# Patient Record
Sex: Male | Born: 1945 | Race: Black or African American | Hispanic: No | Marital: Married | Smoking: Never smoker
Health system: Southern US, Community
[De-identification: ages and names within clinical notes are randomized; demographics above are authoritative.]

## PROBLEM LIST (undated history)

## (undated) DIAGNOSIS — I10 Essential (primary) hypertension: Secondary | ICD-10-CM

## (undated) DIAGNOSIS — I2510 Atherosclerotic heart disease of native coronary artery without angina pectoris: Secondary | ICD-10-CM

## (undated) DIAGNOSIS — K922 Gastrointestinal hemorrhage, unspecified: Secondary | ICD-10-CM

## (undated) DIAGNOSIS — D649 Anemia, unspecified: Secondary | ICD-10-CM

## (undated) DIAGNOSIS — K219 Gastro-esophageal reflux disease without esophagitis: Secondary | ICD-10-CM

## (undated) DIAGNOSIS — M1990 Unspecified osteoarthritis, unspecified site: Secondary | ICD-10-CM

## (undated) DIAGNOSIS — E039 Hypothyroidism, unspecified: Secondary | ICD-10-CM

## (undated) DIAGNOSIS — N400 Enlarged prostate without lower urinary tract symptoms: Secondary | ICD-10-CM

## (undated) DIAGNOSIS — H547 Unspecified visual loss: Secondary | ICD-10-CM

## (undated) DIAGNOSIS — E785 Hyperlipidemia, unspecified: Secondary | ICD-10-CM

## (undated) DIAGNOSIS — D72819 Decreased white blood cell count, unspecified: Principal | ICD-10-CM

## (undated) HISTORY — DX: Anemia, unspecified: D64.9

## (undated) HISTORY — DX: Hyperlipidemia, unspecified: E78.5

## (undated) HISTORY — PX: CORONARY ANGIOPLASTY WITH STENT PLACEMENT: SHX49

## (undated) HISTORY — PX: COLONOSCOPY: SHX174

## (undated) HISTORY — DX: Decreased white blood cell count, unspecified: D72.819

## (undated) HISTORY — DX: Essential (primary) hypertension: I10

## (undated) HISTORY — DX: Benign prostatic hyperplasia without lower urinary tract symptoms: N40.0

## (undated) HISTORY — DX: Atherosclerotic heart disease of native coronary artery without angina pectoris: I25.10

## (undated) HISTORY — PX: ESOPHAGOGASTRODUODENOSCOPY: SHX1529

## (undated) HISTORY — DX: Gastrointestinal hemorrhage, unspecified: K92.2

## (undated) HISTORY — PX: HEMORRHOID SURGERY: SHX153

## (undated) HISTORY — DX: Hypothyroidism, unspecified: E03.9

## (undated) HISTORY — DX: Unspecified visual loss: H54.7

---

## 1999-01-11 ENCOUNTER — Encounter (HOSPITAL_BASED_OUTPATIENT_CLINIC_OR_DEPARTMENT_OTHER): Payer: Self-pay | Admitting: General Surgery

## 1999-01-15 ENCOUNTER — Ambulatory Visit (HOSPITAL_COMMUNITY): Admission: RE | Admit: 1999-01-15 | Discharge: 1999-01-16 | Payer: Self-pay | Admitting: General Surgery

## 2000-12-01 ENCOUNTER — Emergency Department (HOSPITAL_COMMUNITY): Admission: EM | Admit: 2000-12-01 | Discharge: 2000-12-01 | Payer: Self-pay | Admitting: Emergency Medicine

## 2000-12-07 ENCOUNTER — Emergency Department (HOSPITAL_COMMUNITY): Admission: EM | Admit: 2000-12-07 | Discharge: 2000-12-07 | Payer: Self-pay | Admitting: Internal Medicine

## 2001-07-10 ENCOUNTER — Encounter: Payer: Self-pay | Admitting: Emergency Medicine

## 2001-07-10 ENCOUNTER — Emergency Department (HOSPITAL_COMMUNITY): Admission: EM | Admit: 2001-07-10 | Discharge: 2001-07-10 | Payer: Self-pay | Admitting: Emergency Medicine

## 2001-07-12 ENCOUNTER — Inpatient Hospital Stay (HOSPITAL_COMMUNITY): Admission: EM | Admit: 2001-07-12 | Discharge: 2001-07-16 | Payer: Self-pay | Admitting: Cardiology

## 2004-09-30 DIAGNOSIS — K922 Gastrointestinal hemorrhage, unspecified: Secondary | ICD-10-CM

## 2004-09-30 HISTORY — DX: Gastrointestinal hemorrhage, unspecified: K92.2

## 2005-09-18 ENCOUNTER — Inpatient Hospital Stay (HOSPITAL_COMMUNITY): Admission: EM | Admit: 2005-09-18 | Discharge: 2005-09-24 | Payer: Self-pay | Admitting: Emergency Medicine

## 2005-12-12 ENCOUNTER — Ambulatory Visit: Payer: Self-pay | Admitting: Cardiology

## 2005-12-23 ENCOUNTER — Ambulatory Visit: Payer: Self-pay

## 2007-12-28 ENCOUNTER — Ambulatory Visit: Payer: Self-pay | Admitting: Cardiovascular Disease

## 2007-12-28 ENCOUNTER — Observation Stay (HOSPITAL_COMMUNITY): Admission: EM | Admit: 2007-12-28 | Discharge: 2007-12-30 | Payer: Self-pay | Admitting: Emergency Medicine

## 2007-12-29 ENCOUNTER — Encounter: Payer: Self-pay | Admitting: Cardiovascular Disease

## 2008-01-14 ENCOUNTER — Ambulatory Visit: Payer: Self-pay | Admitting: Cardiology

## 2008-06-17 ENCOUNTER — Ambulatory Visit: Payer: Self-pay | Admitting: Cardiology

## 2008-10-06 ENCOUNTER — Ambulatory Visit: Payer: Self-pay | Admitting: Cardiology

## 2008-10-13 ENCOUNTER — Ambulatory Visit: Payer: Self-pay

## 2008-11-03 ENCOUNTER — Ambulatory Visit: Payer: Self-pay | Admitting: Cardiology

## 2008-12-13 ENCOUNTER — Encounter: Payer: Self-pay | Admitting: Cardiology

## 2008-12-13 ENCOUNTER — Ambulatory Visit: Payer: Self-pay | Admitting: Cardiology

## 2008-12-13 ENCOUNTER — Encounter (INDEPENDENT_AMBULATORY_CARE_PROVIDER_SITE_OTHER): Payer: Self-pay | Admitting: *Deleted

## 2008-12-13 DIAGNOSIS — E785 Hyperlipidemia, unspecified: Secondary | ICD-10-CM

## 2008-12-13 DIAGNOSIS — I10 Essential (primary) hypertension: Secondary | ICD-10-CM | POA: Insufficient documentation

## 2008-12-13 DIAGNOSIS — E039 Hypothyroidism, unspecified: Secondary | ICD-10-CM | POA: Insufficient documentation

## 2008-12-13 DIAGNOSIS — I2510 Atherosclerotic heart disease of native coronary artery without angina pectoris: Secondary | ICD-10-CM | POA: Insufficient documentation

## 2009-06-01 ENCOUNTER — Ambulatory Visit: Payer: Self-pay | Admitting: Cardiology

## 2010-06-08 ENCOUNTER — Ambulatory Visit: Payer: Self-pay | Admitting: Cardiology

## 2010-07-18 ENCOUNTER — Encounter: Payer: Self-pay | Admitting: Cardiology

## 2010-11-01 NOTE — Assessment & Plan Note (Signed)
Summary: 1 yr rov 414.01  pfh,rn  Medications Added CETIRIZINE HCL 10 MG TABS (CETIRIZINE HCL) as needed VITAMIN D3 1000 UNIT CAPS (CHOLECALCIFEROL) 1 by mouth daily      Allergies Added: NKDA  Visit Type:  Follow-up Primary Provider:  Dr. Margaretmary Bayley  CC:  CAD.  History of Present Illness: The patient presents for yearly followup. Since I last saw him he has been doing quite well. He remains active working. He says that rarely she will get some chest pain. He thinks he used about 3 or 4 nitroglycerin in the past year. This has been a stable pattern since his catheterization in 2009. He says he can do his usual activities without chest pressure, neck or arm discomfort. He is not bothered by shortness of breath, PND or orthopnea. He does not describe palpitations, presyncope or syncope. He has had some leg pain recently. He was found to have a low vitamin D level which was thought to be the culprit and this is currently being treated. He does not describe claudication but rather a neuropathic type pain.  Current Medications (verified): 1)  Isosorbide Mononitrate Cr 120 Mg Xr24h-Tab (Isosorbide Mononitrate) .... Take One Tablet By Mouth Daily 2)  Synthroid 100 Mcg Tabs (Levothyroxine Sodium) .Marland Kitchen.. 1 Tab Daily 3)  Toprol Xl 100mg  .... 1 Tab Daily 4)  Avodart 0.5 Mg Caps (Dutasteride) .Marland Kitchen.. 1 By Mouth Daily 5)  Lipitor 20 Mg Tabs (Atorvastatin Calcium) .... Take 1 Tab By Mouth Daily 6)  Flomax 0.4 Mg Cp24 (Tamsulosin Hcl) .... Take 1 Capsule By Mouth Nightly 7)  Omeprazole 20 Mg Cpdr (Omeprazole) .... One By Mouth Daily 8)  Aspirin 81 Mg  Tbec (Aspirin) .... One By Mouth Every Day 9)  Nitroglycerin 0.4 Mg Subl (Nitroglycerin) .... As Directed For Cp 10)  Cetirizine Hcl 10 Mg Tabs (Cetirizine Hcl) .... As Needed 11)  Vitamin D3 1000 Unit Caps (Cholecalciferol) .Marland Kitchen.. 1 By Mouth Daily  Allergies (verified): No Known Drug Allergies  Past History:  Past Medical History:  1. Coronary artery  disease (catheterization June 2009, demonstrate left mild calcification, LAD had luminal irregularities, 60% stenosis in the distal portion, circumflex had a stent in the AV groove which was via the patent.  The right coronary artery had a  mid stent which is via the patent.  EF of 60%.  There was posterolateral branch with 70% stenosis that was near in caliber. The EF of 65%).).   2. Hypothyroidism.   3. Gastrointestinal bleeding in 2006.   4. Congenital blindness.   5. Hypertension.   6. Dyslipidemia.   7. Benign prostatic hypertrophy.      Past Surgical History: Reviewed history from 12/12/2008 and no changes required. EGD Colonoscopy Hemorrhoid surgery  Review of Systems       As stated in the HPI and negative for all other systems.   Vital Signs:  Patient profile:   65 year old male Height:      63 inches Weight:      180 pounds BMI:     32.00 Pulse rate:   86 / minute Resp:     16 per minute BP sitting:   145 / 81  (right arm)  Vitals Entered By: Marrion Coy, CNA (June 08, 2010 10:54 AM)  Physical Exam  General:  Well developed, well nourished, in no acute distress. Head:  normocephalic and atraumatic Mouth:  Teeth, gums and palate normal. Oral mucosa normal. Neck:  Neck supple, no JVD. No  masses, thyromegaly or abnormal cervical nodes. Chest Wall:  no deformities or breast masses noted Lungs:  Clear bilaterally to auscultation and percussion. Abdomen:  Bowel sounds positive; abdomen soft and non-tender without masses, organomegaly, or hernias noted. No hepatosplenomegaly. Msk:  Back normal, normal gait. Muscle strength and tone normal. Extremities:  No clubbing or cyanosis. Neurologic:  Alert and oriented x 3. Skin:  Intact without lesions or rashes. Cervical Nodes:  no significant adenopathy Inguinal Nodes:  no significant adenopathy Psych:  Normal affect.   Detailed Cardiovascular Exam  Neck    Carotids: Carotids full and equal bilaterally without  bruits.      Neck Veins: Normal, no JVD.    Heart    Inspection: no deformities or lifts noted.      Palpation: normal PMI with no thrills palpable.      Auscultation: regular rate and rhythm, S1, S2 without murmurs, rubs, gallops, or clicks.    Vascular    Abdominal Aorta: no palpable masses, pulsations, or audible bruits.      Femoral Pulses: normal femoral pulses bilaterally.      Pedal Pulses: pulses normal in all 4 extremities    Radial Pulses: normal radial pulses bilaterally.      Peripheral Circulation: no clubbing, cyanosis, or edema noted with normal capillary refill.     EKG  Procedure date:  06/08/2010  Findings:      sinus bradycardia, rate 87, left axis deviation, right atrial enlargement, premature atrial contractions, no acute ST-T wave changes  Impression & Recommendations:  Problem # 1:  CAD (ICD-414.00) He has rare chest pain which is a stable pattern. This has not changed since his last evaluation with catheterization in 2009. No change in therapies or further testing is indicated. He should continue with secondary risk reduction.  Problem # 2:  ESSENTIAL HYPERTENSION, BENIGN (ICD-401.1) His blood pressure is slightly elevated today. However, it was not previously and he says is well controlled at home. Therefore, I will not suggest change. Certainly weight loss would be helpful to maintain target.  Problem # 3:  HYPERLIPIDEMIA (ICD-272.4) He says this is followed by his primary physician. Goal should be an LDL less than 100 and HDL greater than 40.  Patient Instructions: 1)  Your physician recommends that you schedule a follow-up appointment in: 12 months with Dr Antoine Poche 2)  Your physician recommends that you continue on your current medications as directed. Please refer to the Current Medication list given to you today.

## 2010-11-02 ENCOUNTER — Encounter: Payer: Self-pay | Admitting: Cardiology

## 2010-11-02 ENCOUNTER — Ambulatory Visit: Admit: 2010-11-02 | Payer: Self-pay | Admitting: Cardiology

## 2010-11-02 ENCOUNTER — Ambulatory Visit (INDEPENDENT_AMBULATORY_CARE_PROVIDER_SITE_OTHER): Payer: BC Managed Care – PPO | Admitting: Cardiology

## 2010-11-02 DIAGNOSIS — I2510 Atherosclerotic heart disease of native coronary artery without angina pectoris: Secondary | ICD-10-CM

## 2010-11-07 NOTE — Assessment & Plan Note (Signed)
Summary: 70yr f/u.mt/sp   Vital Signs:  Patient profile:   65 year old male Height:      63 inches Weight:      180 pounds BMI:     32.00 Pulse rate:   69 / minute Resp:     18 per minute BP sitting:   129 / 67  (left arm)  Vitals Entered By: Celestia Khat, CMA (November 02, 2010 2:32 PM)  Visit Type:  Follow-up 1 year Primary Provider:  Dr. Margaretmary Bayley  CC:  CAD.  History of Present Illness: The patient returns for routine followup. Since I last saw him he has had no new cardiovascular complaints. Continues to work. He does rarely takes nitroglycerin but hasn't used this in January. With his activities he denies any reproducible chest pressure, neck or arm discomfort. He has no palpitations, presyncope or syncope. He has had no weight gain or edema. He denies any shortness of breath, PND or orthopnea.  Problems Prior to Update: 1)  Hyperlipidemia  (ICD-272.4) 2)  Essential Hypertension, Benign  (ICD-401.1) 3)  Hypothyroidism  (ICD-244.9) 4)  Cad  (ICD-414.00)  Current Medications (verified): 1)  Isosorbide Mononitrate Cr 120 Mg Xr24h-Tab (Isosorbide Mononitrate) .... Take One Tablet By Mouth Daily 2)  Synthroid 100 Mcg Tabs (Levothyroxine Sodium) .Marland Kitchen.. 1 Tab Daily 3)  Toprol Xl 100mg  .... 1 Tab Daily 4)  Avodart 0.5 Mg Caps (Dutasteride) .Marland Kitchen.. 1 By Mouth Daily 5)  Lipitor 20 Mg Tabs (Atorvastatin Calcium) .... Take 1 Tab By Mouth Daily 6)  Flomax 0.4 Mg Cp24 (Tamsulosin Hcl) .... Take 1 Capsule By Mouth Nightly 7)  Omeprazole 20 Mg Cpdr (Omeprazole) .... One By Mouth Daily 8)  Aspirin 81 Mg  Tbec (Aspirin) .... One By Mouth Every Day 9)  Nitroglycerin 0.4 Mg Subl (Nitroglycerin) .... As Directed For Cp 10)  Cetirizine Hcl 10 Mg Tabs (Cetirizine Hcl) .... As Needed 11)  Vitamin D3 1000 Unit Caps (Cholecalciferol) .Marland Kitchen.. 1 By Mouth Daily  Allergies (verified): No Known Drug Allergies  Past History:  Past Medical History: Reviewed history from 06/08/2010 and no changes  required.  1. Coronary artery disease (catheterization June 2009, demonstrate left mild calcification, LAD had luminal irregularities, 60% stenosis in the distal portion, circumflex had a stent in the AV groove which was via the patent.  The right coronary artery had a  mid stent which is via the patent.  EF of 60%.  There was posterolateral branch with 70% stenosis that was near in caliber. The EF of 65%).).   2. Hypothyroidism.   3. Gastrointestinal bleeding in 2006.   4. Congenital blindness.   5. Hypertension.   6. Dyslipidemia.   7. Benign prostatic hypertrophy.      Past Surgical History: Reviewed history from 12/12/2008 and no changes required. EGD Colonoscopy Hemorrhoid surgery  Review of Systems       As stated in the HPI and negative for all other systems.   Physical Exam  General:  Well developed, well nourished, in no acute distress. Head:  normocephalic and atraumatic Mouth:  Teeth, gums and palate normal. Oral mucosa normal. Neck:  Neck supple, no JVD. No masses, thyromegaly or abnormal cervical nodes. Chest Wall:  no deformities or breast masses noted Lungs:  Clear bilaterally to auscultation and percussion. Abdomen:  Bowel sounds positive; abdomen soft and non-tender without masses, organomegaly, or hernias noted. No hepatosplenomegaly. Msk:  Back normal, normal gait. Muscle strength and tone normal. Extremities:  No clubbing or cyanosis.  Neurologic:  Alert and oriented x 3. Skin:  Intact without lesions or rashes. Cervical Nodes:  no significant adenopathy Inguinal Nodes:  no significant adenopathy Psych:  Normal affect.   Detailed Cardiovascular Exam  Neck    Carotids: Carotids full and equal bilaterally without bruits.      Neck Veins: Normal, no JVD.    Heart    Inspection: no deformities or lifts noted.      Palpation: normal PMI with no thrills palpable.      Auscultation: regular rate and rhythm, S1, S2 without murmurs, rubs, gallops, or clicks.     Vascular    Abdominal Aorta: no palpable masses, pulsations, or audible bruits.      Femoral Pulses: normal femoral pulses bilaterally.      Pedal Pulses: pulses normal in all 4 extremities    Radial Pulses: normal radial pulses bilaterally.      Peripheral Circulation: no clubbing, cyanosis, or edema noted with normal capillary refill.     Impression & Recommendations:  Problem # 1:  CAD (ICD-414.00) The patient has had no new symptoms. No further cardiovascular testing is suggested. He will continue with secondary risk reduction.  Of note I reviewed his EKG. He had normal sinus rhythm, rate 71, leftward axis, no acute ST-T wave changes, intervals within normal limits  Problem # 2:  ESSENTIAL HYPERTENSION, BENIGN (ICD-401.1) His blood pressure is controlled and he will continue meds as listed.  Problem # 3:  HYPERLIPIDEMIA (ICD-272.4) Acute lipids done with an HDL of 30 and LDL of 69. He will continue with his current therapy though he understands his HDL is not at target.  Patient Instructions: 1)  Your physician recommends that you schedule a follow-up appointment in: 12 months with Dr Antoine Poche 2)  Your physician recommends that you continue on your current medications as directed. Please refer to the Current Medication list given to you today. Prescriptions: NITROGLYCERIN 0.4 MG SUBL (NITROGLYCERIN) as directed for cp  #25 x prn   Entered by:   Charolotte Capuchin, RN   Authorized by:   Rollene Rotunda, MD, [facility]   Signed by:   Charolotte Capuchin, RN on 11/02/2010   Method used:   Electronically to        [facility] (retail)       [address]       [place], [place]  [address]       Ph: [phone]       Fax: [phone]   RxID:   [id number]

## 2010-11-07 NOTE — Letter (Signed)
Summary: Work Writer, Main Office  [address]   [place], [place] [address]   Phone: [phone]  Fax: [phone]         November 02, 2010    Dustin Hess   The above named patient had a medical visit today.  Please take this into consideration when reviewing the time away from work/school.      Sincerely yours,     Avie Arenas, RN for Dr. Rollene Rotunda [facility]

## 2010-11-27 NOTE — Letter (Signed)
Summary: Health Risk Assessment   Health Risk Assessment   Imported By: Roderic Ovens 11/21/2010 12:55:06  _____________________________________________________________________  External Attachment:    Type:   Image     Comment:   External Document

## 2011-01-08 ENCOUNTER — Other Ambulatory Visit: Payer: Self-pay | Admitting: Cardiology

## 2011-02-12 NOTE — Assessment & Plan Note (Signed)
[facility]                            CARDIOLOGY OFFICE NOTE   Dustin, Diveley TROYE Hess               MRN:          [id number]  DATE:10/06/2008                            DOB:          26-Mar-1946    PRIMARY CARE PHYSICIAN:  Margaretmary Bayley, MD.   REASON FOR PRESENTATION:  Evaluate the patient with chest discomfort.   HISTORY OF PRESENT ILLNESS:  The patient called to be added onto the  schedule.  He has been having some chest discomfort.  This has been  going on, he said since the holidays.  He has had some upper epigastric  discomfort.  He is noticing this if he walks a little bit briskly.  He  will get a dull, aching discomfort.  He does not have any symptoms at  rest.  When he gets this discomfort, it is substernal.  It seems to be  heavy discomfort.  It is mild to moderate.  He is not having any  associated nausea, vomiting, or diaphoresis.  He has nitroglycerin, but  has not really taking any.  It goes away after several minutes.  It  feels like it is previous angina.  He does not say that it is radiating  to his jaw or to his arms.  He is not having any palpitation,  presyncope, or syncope.   PAST MEDICAL HISTORY:  1. Coronary artery disease (catheterization June 2009, demonstrated      left mild calcification, LAD had luminal irregularities, 60%      stenosis in the distal portion, circumflex had a stent in the AV      groove which was via the patent.  The right coronary artery had a      mid stent which is via the patent.  EF of 60%.  There was      posterolateral branch with 70% stenosis that was near in caliber.      The EF of 65%).  2. Hypothyroidism.  3. Gastrointestinal bleeding in 2006.  4. Congenital blindness.  5. Hypertension.  6. Dyslipidemia.  7. Benign prostatic hypertrophy.   ALLERGIES/INTOLERANCES:  None.   MEDICATIONS:  1. Synthroid 100 mcg daily.  2. Metoprolol 100 mg daily.  3. Avodart.  4. Lipitor 20 mg  nightly.  5. Flomax.  6. Omeprazole 20 mg daily.  7. Aspirin 325 mg daily.  8. Isosorbide 60 mg daily.  9. Iron 27 mg b.i.d.   REVIEW OF SYSTEMS:  As stated in the HPI and otherwise negative for  other systems.   PHYSICAL EXAMINATION:  GENERAL:  The patient is in no distress.  VITAL SIGNS:  Blood pressure 111/62, heart rate 84 and regular, and  weight 170 pounds.  HEENT:  Eyes unremarkable.  Fundi are not visualized.  Pupils are  nonreactive.  Oral mucosa unremarkable.  NECK:  No jugular venous distention at 45 degrees.  Carotid upstroke  brisk and symmetric.  No bruits.  No thyromegaly.  LYMPHATIC:  No cervical, axillary, or inguinal adenopathy.  LUNGS:  Clear to auscultation bilaterally.  BACK:  No costovertebral angle tenderness.  CHEST:  Unremarkable.  HEART:  PMI not displaced  or sustained.  S1 and S2 within normal limits.  No S3, no S4.  No clicks, no rubs, no murmurs.  ABDOMEN:  Flat, positive bowel sounds, normal in frequency and pitch.  No bruits, no rebound, no guarding.  No midline pulsatile mass.  No  hepatomegaly.  No splenomegaly.  SKIN:  No rashes, no nodules.  EXTREMITIES:  Pulses 2+ throughout.  No edema, no cyanosis, no clubbing.  NEUROLOGIC:  Oriented to person, place, and time.  Cranial nerves II-XII  grossly intact.  Motor grossly intact.   EKG, sinus rhythm, rate 84, axis within normal limits, intervals within  normal limits, no acute ST wave changes.   ASSESSMENT AND PLAN:  1. Coronary artery disease.  The patient is having some chest pain      that is concerning for new exertional angina.  Given this, I think      stress perfusion imaging is warranted.  We could compare this to      the previous one of 2007.  Further evaluation will be based on      these results.  Any low-risk findings, I could manage medically,      but high-risk findings would require repeat cardiac      catheterization.  I will take his nitroglycerin if he has any      recurrent  pain.  Until he has a stress test, he will be taking it      easy.  He will need an adenosine Cardiolite.  2. Hypertension.  Blood pressure is well controlled.  He will continue      the meds as listed.  3. Dyslipidemia.  He will continue to follow this with Dr. Chestine Spore with      a goal LDL less than 100 and HDL greater than 40.  4. Hypothyroidism per Dr. Chestine Spore.  5. Followup.  I will see the patient again in 1 month or sooner if he      has any abnormality on his stress test.     Dustin Rotunda, MD, [facility]  Electronically Signed    JH/MedQ  DD: 10/06/2008  DT: 10/07/2008  Job #: [id number]   cc:   Margaretmary Bayley, M.D.

## 2011-02-12 NOTE — Assessment & Plan Note (Signed)
[facility]                            CARDIOLOGY OFFICE NOTE   Dustin Hess, Dustin Hess               MRN:          [id number]  DATE:01/14/2008                            DOB:          1946/01/10    PRIMARY CARE PHYSICIAN:  Dr. Margaretmary Bayley.   REASON FOR PRESENTATION:  Evaluate patient with chest pain.   HISTORY OF PRESENT ILLNESS:  The patient is a very pleasant 65 year old  African American gentleman who was recently hospitalized with chest  pain.  He ruled out for myocardial infarction.  He had a cardiac  catheterization demonstrating that the left main had mild calcification,  the LAD had mild luminal irregularities, there was a bifurcating  diagonal which had a 60% stenosis in the distal portion, the circumflex  had a stent in the AV groove which was widely patent, the right coronary  artery had a mid stent which was widely patent.  The EF was 60%.  There  was a posterolateral branch with 70% stenosis though it was narrow  caliber.  The EF was 65%.  The patient was managed medically.   Since discharge, he has had no chest pressure, neck or arm discomfort.  He has had no palpitations, presyncope or syncope.  He has had no PND or  orthopnea.   PAST MEDICAL HISTORY:  1. Coronary artery disease as described.  2. Hypothyroidism.  3. Gastrointestinal bleeding in 2006.  4. Congenital blindness.  5. Hypertension.  6. Dyslipidemia.  7. Benign prostatic hypertrophy.   ALLERGIES:  None.   MEDICATIONS:  1. Isosorbide 30 mg daily.  2. Synthroid 100 mcg daily.  3. Metoprolol 100 mg daily.  4. Avodart 0.5 mg daily.  5. Lipitor 20 mg nightly.  6. Flomax.  7. Iron  8. Omeprazole.  9. Aspirin 325 mg daily.   REVIEW OF SYSTEMS:  As stated in HPI, otherwise negative for other  systems.   PHYSICAL EXAMINATION:  GENERAL APPEARANCE:  The patient is in no  distress.  VITAL SIGNS:  Blood pressure 152/59, heart rate 72 and regular, weight  170  pounds.  NECK:  No jugular venous distension at 45 degrees.  Carotid upstroke  brisk and symmetrical.  No bruits, no thyromegaly.  LYMPHATICS:  No  adenopathy.  LUNGS:  Clear to auscultation bilaterally.  BACK:  No costovertebral angle tenderness.  CHEST:  Unremarkable.  HEART:  PMI not displaced or sustained, S1-S2 within normal limits, no  S3, positive S4, no murmurs.  ABDOMEN:  Flat, positive bowel sounds, normal in frequency and pitch.  No bruits, rebound, guarding or midline pulsatile mass, no organomegaly.  SKIN:  No rashes, no nodules.  EXTREMITIES:  2+ pulse, no edema.   EKG:  Sinus rhythm, rate 71, left axis deviation, intervals within  normal limits, left atrial enlargement, no acute ST-wave changes.   ASSESSMENT/PLAN:  1. Coronary disease.  The patient has nonobstructive coronary disease      as described.  He is going to be managed medically.  He will      continue with secondary risk reduction.  2. Hypothyroidism.  He will continue on Synthroid replacement and  management per Dr. Chestine Spore.  3. Dyslipidemia.  He will remain on Lipitor.  The lipids were not      checked in the hospital and I will defer to Dr. Chestine Spore.  The goal      being LDL less than 100, HDL greater than 40.  4. Hypertension.  Blood pressure is slightly elevated.  He may need      the addition of another medications such as amlodipine.  However,      first I am going to try just increasing his nitrates to Imdur 60 mg      daily.  Further evaluation will be based future blood pressure      readings.  5. Follow-up.  I would like to see him back in 6 months or sooner if      needed.     Dustin Rotunda, MD, [facility]  Electronically Signed    JH/MedQ  DD: 01/14/2008  DT: 01/14/2008  Job #: [id number]   cc:   Margaretmary Bayley, M.D.

## 2011-02-12 NOTE — Discharge Summary (Signed)
NAME:  Dustin Hess, Dustin Hess NO.:  [id number]   MEDICAL RECORD NO.:  [id number]          PATIENT TYPE:  OBV   LOCATION:  4731                         FACILITY:  [facility]   PHYSICIAN:  Rollene Rotunda, MD, FACCDATE OF BIRTH:  Jan 02, 1946   DATE OF ADMISSION:  12/28/2007  DATE OF DISCHARGE:  12/30/2007                               DISCHARGE SUMMARY   PRIMARY CARDIOLOGIST:  Dr. Rollene Rotunda.   PRIMARY CARE Arshia Rondon:  Dr. Margaretmary Bayley.   DISCHARGE DIAGNOSIS:  Chest pain.   SECONDARY DIAGNOSES:  1. Coronary artery disease status post prior stenting of the right      coronary artery and left circumflex.  2. History of preserved left ventricular function.  Ejection fraction      60%.  3. Hypothyroidism.  4. History of acute gastrointestinal bleed, December 2006.  5. Congenital blindness.  6. Hypertension.  7. Hyperlipidemia.  8. Benign prostatic hypertrophy.   ALLERGIES:  NO KNOWN DRUG ALLERGIES.   PROCEDURE:  A left heart cardiac catheterization.   HISTORY OF PRESENT ILLNESS:  A 65 year old male with prior history of  CAD.  He presented to the [facility] ED on December 28, 2007 with  complaints of exertional substernal chest discomfort relieved with  sublingual nitroglycerin.  Decision was made to admit him for further  evaluation.   HOSPITAL COURSE:  The patient ruled out for MI and underwent left heart  cardiac catheterization, on December 28, 2007.  This revealed patent stents  in the circumflex with 60% in-stent restenosis within the right coronary  artery and otherwise nonobstructive disease with normal LV function and  an EF of 65%.  Films reviewed by  Dr. Riley Kill, it was felt that the 60%  stenosis in-stent in the RCA was unlikely to be causing angina, and it  was felt that the patient would be best managed medically.  The patient  was noted be tachycardic with rates in the 100s and therefore his beta-  blocker was titrated to 100 mg daily.  He has not had any  recurrent  symptoms, and will be discharged home today in good condition.   DISCHARGE LABS:  Hemoglobin 14.6, hematocrit 43.2, WBC 5.2, platelets  133, MCV 92.9, sodium 142, potassium 4.4, chloride 108, CO2 of 25, BUN  13, creatinine 0.96, glucose 121, INR 1.0, total bilirubin 0.9, alkaline  phosphatase 75, AST 26, ALT 22, and albumin 3.9.  Cardiac markers  negative x4.  Calcium 9.0.  Urinalysis was  negative.   DISPOSITION:  The patient is being discharged home today in good  condition.   FOLLOWUP PLAN AND APPOINTMENTS:  The patient has scheduled follow up  with Dr. Antoine Poche on January 14, 2008, at 1:30 p.m..  He is also  to  follow up with Dr. Chestine Spore as previously scheduled.   DISCHARGE MEDICATIONS:  1. Aspirin 81 mg daily.  2. Flomax 0.4 mg daily.  3. Lipitor 20 mg daily.  4. Avodart 0.5 mg daily.  5. Synthroid 100 mcg daily.  6. Toprol-XL 100 mg daily  7. Protonix 40 mg daily.  8. Ferrous gluconate as previously prescribed.  9. MiraLax as previously prescribed  10.Nitroglycerin 0.4 mg sublingual, chest pain.  11.Imdur 30 mg daily.   OUTSTANDING LABORATORY STUDIES:  None.   DURATION OF DISCHARGE/ENCOUNTER:  45 minutes including physician time.      Nicolasa Ducking, ANP      Rollene Rotunda, MD, [facility]  Electronically Signed    CB/MEDQ  D:  12/30/2007  T:  12/31/2007  Job:  [id number]   cc:   Margaretmary Bayley, M.D.

## 2011-02-12 NOTE — Cardiovascular Report (Signed)
NAME:  Dustin Hess, Dustin Hess NO.:  [id number]   MEDICAL RECORD NO.:  [id number]          PATIENT TYPE:  OBV   LOCATION:  1827                         FACILITY:  [facility]   PHYSICIAN:  Jonelle Sidle, MD DATE OF BIRTH:  11/20/1945   DATE OF PROCEDURE:  DATE OF DISCHARGE:                            CARDIAC CATHETERIZATION   REQUESTING CARDIOLOGIST:  Noralyn Pick. Eden Emms, MD, [facility].   PRIMARY CARDIOLOGIST:  Rollene Rotunda, MD, [facility].   PRIMARY CARE PHYSICIAN:  Margaretmary Bayley, M.D.   INDICATIONS:  Mr. Caselli is a pleasant 65 year old gentleman with a  history of congenital blindness, hyperlipidemia, hypertension, previous  gastrointestinal bleed approximately 2 years ago, and known coronary  artery disease status post stent placement to the mid circumflex in  2002, as well as stent placement to the mid to distal right coronary  artery in 2002.  He now presents to the emergency department  experiencing chest discomfort that began this morning described as a  tightness and also associated with sinus tachycardia.  The patient  states that he ran out of his Toprol XL and has not taken his medicine  for approximately 2 days.  He was treated in the emergency department  and subsequently seen by cardiology.  His electrocardiogram was overall  nonspecific and his initial cardiac markers were normal.  He had  resolution of symptoms and is referred now for diagnostic cardiac  catheterization to redefine coronary anatomy and assess stent patency  for obvious revascularizations options.  The potential risks and  benefits were explained to him in advance and informed consent was  obtained.   PROCEDURE PERFORMED:  1. Left heart catheterization  2. Selective coronary angiography.  3. Left ventriculography.   ACCESS AND EQUIPMENT:  The area about the right femoral artery was  anesthetized with 1% lidocaine.  A 6-French sheath was placed in the  right femoral artery via the modified  Seldinger technique.  Standard  preformed 6-French JL-4 and JR-4 catheters were used for selective  coronary angiography and an angled pigtail catheter was used for left  heart catheterization and left ventriculography.  A total of 90 mL  Omnipaque were used.  All exchanges were made over the wire and the  patient tolerated the procedure well without immediate complications.   HEMODYNAMICS:  Aorta: 134/69 mmHg.  Left ventricle: 134/5 mmHg.   ANGIOGRAPHIC FINDINGS:  1. The left main coronary artery is relatively large and gives rise to      the left anterior  descending and circumflex vessels.  There is      mild calcification noted with no flow-limiting stenosis.  2. The left anterior descending is a medium caliber vessel that      extends down to the apex.  There is a proximal bifurcating diagonal      branch that has approximately 60% stenosis noted diffusely towards      the distal portion of the vessel.  The left anterior descending has      only mild luminal irregularities including 20% mid to distal      stenosis.  There are no flow-limiting stenoses.  3. The circumflex vessel is medium  in caliber.  There is a large first      obtuse marginal branch that bifurcates, followed by a smaller      branch and then a larger distal bifurcating branch.  A stent site      is visualized within the mid circumflex vessel and this area is      widely patent.  There are otherwise mild luminal irregularities      without flow-limiting stenosis.  4. The right coronary artery is medium in caliber.  A stent is      visualized within the mid to distal portion of the vessel.  Within      this is an area of approximately 60% in-stent restenosis.  Overall      vessel flow was TIMI III.  The posterior descending branch is      diffusely diseased approximately 70% and is relatively small in      caliber.  There are otherwise mild luminal irregularities noted.   Left ventriculography was performed in the  RAO projection and revealed  an ejection fraction approximately 65% with no focal wall motion  abnormalities and no mitral regurgitation.   DIAGNOSES:  1. Coronary artery disease as outlined.  The stent site within the mid      circumflex vessel is widely patent.  There is otherwise mild      atherosclerosis within the left anterior descending proper and      remaining circumflex vessel.  The right coronary artery has a area      of in-stent restenosis of approximately 60% in the mid vessel.      This area is fairly focal and is overall moderate.  The posterior      descending branch is relatively small and has a diffuse area of      stenosis of approximately 70%.  2. Left ventricular ejection fraction of approximately 65% with no      focal wall motion abnormalities.  No mitral regurgitation and left      ventricular end-diastolic pressure of 5 mmHg.   DISCUSSION:  I reviewed the films with Dr. Riley Kill.  I also discussed  the results with the patient.  It seems unlikely that the in-stent  restenosis in the right coronary artery would be leading to rest angina  based on its moderate degree of stenosis.  It may well be however, that  with the patient's relative tachycardia off beta-blockers, he is  experiencing angina due to relative supply and demand rather than an  acute coronary syndrome.  The plan at this point will be continued  observation with cycled cardiac markers and titration of medical therapy  including resumption of beta-blocker and perhaps a long-acting nitrate.  Further plans to follow depending on clinical progress.      Jonelle Sidle, MD  Electronically Signed     SGM/MEDQ  D:  12/28/2007  T:  12/28/2007  Job:  [id number]   cc:   Noralyn Pick. Eden Emms, MD, [facility]  Rollene Rotunda, MD, [facility]  Margaretmary Bayley, M.D.

## 2011-02-12 NOTE — Assessment & Plan Note (Signed)
[facility]                            CARDIOLOGY OFFICE NOTE   Dustin Hess, Leamer GIANLUCCA SZYMBORSKI               MRN:          [id number]  DATE:06/17/2008                            DOB:          1946-05-28    PRIMARY CARE PHYSICIAN:  Margaretmary Bayley, MD   REASON FOR PRESENTATION:  Evaluate the patient with coronary artery  disease.   HISTORY OF PRESENT ILLNESS:  The patient returns for followup.  He is 65  years old.  He has coronary artery disease as described below.  When I  last saw him, he was having some chest discomfort.  I did increase his  Imdur from 30 to 60 mg.  Since that time, he has had some fleeting chest  discomfort.  However, it is certainly not more and perhaps less than it  was previously.  He is occasionally taking nitroglycerin.  It comes on  sporadically.  It does not seem to be severe.  He is not describing  associated symptoms.  He is able to do some walking and he is trying to  do a little more of this and does not bring on these symptoms.   PAST MEDICAL HISTORY:  Coronary artery disease (catheterization in 2009,  left main mild calcification.  The LAD had luminal irregularities.  There was 60% stenosis in the distal portion.  The circumflex had a  stent in AV groove which was widely patent.  The right coronary artery  had a mid stent which was widely patent.  The EF was 60%.  There was a  posterolateral branch with 70% stenosis that was a narrow caliber.  The  EF was 65%.), hypothyroidism, gastrointestinal bleeding in 2006,  congenital blindness, hypertension, dyslipidemia, and benign prostatic  hypertrophy.   ALLERGIES:  None.   MEDICATIONS:  1. Synthroid 100 mcg daily.  2. Metoprolol 100 mg daily.  3. Avodart 0.5 mg daily.  4. Lipitor 20 mg nightly.  5. Flomax.  6. Iron.  7. Omeprazole 20 mg daily.  8. Aspirin 325 mg daily.  9. Isosorbide 60 mg daily.   REVIEW OF SYSTEMS:  As stated in the HPI and is otherwise negative  for  other systems.   PHYSICAL EXAMINATION:  GENERAL:  The patient is in no distress.  VITAL SIGNS:  Blood pressure 144/79, heart rate 84 and regular, weight  166 pounds, body mass index 29.  HEENT:  Eyelids are unremarkable, pupils are not reactive; fundi not  visualized; oral mucosa unremarkable.  NECK:  No jugular venous distention at 45 degrees; carotid upstroke  brisk and symmetric; no bruits; no thyromegaly.  LUNGS:  Lungs clear to auscultation.  HEART:  PMI not displaced or sustained; S1 and S2 within normal;  no S3.  Positive S4.  No murmurs.  ABDOMEN:  Flat.  Positive bowel sounds normal in frequency and pitch.  No bruits.  No rebound.  No guarding.  No midline pulsatile mass.  No  organomegaly.  SKIN:  No rashes, no nodules.  EXTREMITIES:  2+ pulses, no edema.   EKG sinus rhythm, rate 82, left axis deviation, probable left and right  atrial enlargement, no  acute ST-wave changes.   ASSESSMENT AND PLAN:  1. Coronary artery disease.  The patient is having no ongoing      symptoms.  No further cardiovascular workup is suggested.  He will      continue with the risk reduction.  2. Hypertension.  Blood pressure is slightly elevated.  It is better      than it was.  I will defer further management to Dr. Chestine Spore.  3. Dyslipidemia.  I suggested the patient that he has Dr. Chestine Spore if he      has had his cholesterol done recently.  He sees Dr. Chestine Spore      routinely.  The goal should be an LDL less than 100 and HDL greater      than 40.  4. Follow-up.  I will see back in the year or sooner if he has any      increasing symptoms.     Rollene Rotunda, MD, [facility]  Electronically Signed    JH/MedQ  DD: 06/17/2008  DT: 06/18/2008  Job #: [id number]   cc:   Margaretmary Bayley, M.D.

## 2011-02-12 NOTE — H&P (Signed)
NAME:  Dustin Hess, Dustin Hess NO.:  [id number]   MEDICAL RECORD NO.:  [id number]          PATIENT TYPE:  EMS   LOCATION:  MAJO                         FACILITY:  [facility]   PHYSICIAN:  Noralyn Pick. Eden Emms, MD, FACCDATE OF BIRTH:  [birth year]   DATE OF ADMISSION:  12/28/2007  DATE OF DISCHARGE:                              HISTORY & PHYSICAL   PRIMARY CARDIOLOGIST:  Dr. Rollene Rotunda.   PRIMARY CARE PHYSICIAN:  Dr. Margaretmary Bayley.   CHIEF COMPLAINT:  Chest pain.   HISTORY OF PRESENT ILLNESS:  Dustin Hess is a 65 year old male with  known coronary artery disease.  He has not had significant chest pain  since his MI in 2002, although he describes jaw tingling that is  intermittent and might be cardiac in origin.  Today he was getting up  and getting dressed, which is moderate activity for him and he had  sudden onset of substernal chest pain described as a tightness that  reached a 6/10.  He took his aspirin 81 mg and sublingual nitroglycerin.  EMS was called.  The nitroglycerin improved the pain and an additional  sublingual nitroglycerin by EMS as well as 3 more baby aspirin were  given.  He was pain free upon arrival to the emergency room.  Currently  he is resting comfortably.   PAST MEDICAL HISTORY:  1. 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  2. Preserved left ventricular function with an EF of 60% at cath.  3. Hypothyroidism.  4. History of acute GI bleed in December 2006 felt upper GI in origin      without identifiable site.  5. Congenital blindness.  6. Hyperlipidemia.  7. Hypertension.   SURGICAL HISTORY:  He is status post cardiac catheterization as well as  EGD, colonoscopy, and hemorrhoid surgery.   ALLERGIES:  No known drug allergies.   CURRENT MEDICATIONS:  1. Aspirin 81 mg daily.  2. Flomax 0.4 mg 2 tablets nightly.  3. Lipitor 20 mg daily.  4. Have a dark 0.5 mg 1 tablet daily.  5. Ferrous gluconate 27 mg b.i.d. with meals.  6. Synthroid 100 mcg daily.  7. Nitroglycerin sublingual p.r.n.  8. Toprol XL 50 mg daily out since yesterday.  9. MiraLax daily p.r.n.  10.Protonix 40 mg a day.   SOCIAL HISTORY:  Lives in [place] with his wife and works in Media planner  for [employer].  He has no history of alcohol, tobacco, or drug abuse.  He does not exercise regularly and says that he eats a reasonably heart  healthy diet.   FAMILY HISTORY:  Both of his parents died of cancer.  His father at 77  and his mother is 20.  He has multiple siblings but neither any of his  siblings nor his parents had a history of coronary have any history of  coronary artery disease.   REVIEW OF SYSTEMS:  He had some diaphoresis with the chest pain but  there was no radiation and no nausea or vomiting.  HEENT:  Has had no  recent changes.  The blindness is congenital and he has been blind for  many years.  The chest pain is described above but he has had no other  recent episodes of it recently.  He does get occasional tingling in his  jaw, but this is not clearly exertional and he is did not have it today  in association with the chest pain.  He has not had any recent wheezing  and he only coughs occasionally which is nonproductive.  He has had no  recent fevers or chills.  He denies arthralgias.  He has had no recent  melena and no hematemesis or hemoptysis.  Full 14 point review of  systems is otherwise negative.   PHYSICAL EXAM:  VITAL SIGNS:  Temperature is 97.1, blood pressure  133/83, pulse 90, respiratory rate 16, O2 saturation 99% on 2 liters.  GENERAL:  He is a well-developed [place] male in no acute  distress.  HEENT:  His head is normocephalic and atraumatic.  He is status post  right eye enucleation surgically.  His left eye has a clear sclera, but  the pupil does not react and is 2 mm.  Nares are  without discharge.  Oropharynx is without erythema or exudate.  Lips are without cyanosis.  NECK:  There is no lymphadenopathy, thyromegaly, bruit, JVD noted.  CV:  Heart is regular rate and rhythm with an S1-S2 and no significant  murmur or gallop was noted.  Distal pulses are intact in all four  extremities with no femoral bruits appreciated.  LUNGS:  The upper lobes are clear to auscultation with some rales in  both bases.  SKIN:  No rashes or lesions are noted.  ABDOMEN:  Soft and nontender with active bowel sounds and no  splenomegaly by palpation.  EXTREMITIES:  There is no cyanosis, clubbing or edema noted.  MUSCULOSKELETAL:  There is no joint deformity or effusions and no spine  or CVA tenderness is noted.  NEURO:  He is alert, oriented with normal strength and sensation.   CHEST X-RAY:  Shows decreased lung volume with minimal bilateral  atelectasis.   EKG:  Sinus rhythm rate 84 with no acute ischemic changes and no  significant changes from an EKG dated December 2006.   LABORATORY VALUES:  Sodium 139, potassium 4.5, chloride 106, CO2 28, BUN  14, creatinine 1.05, glucose 107, hemoglobin 49.6, hematocrit 43.2, WBCs  5.2, platelets 133,000, INR 1.0, PTT 33.  Point of care markers  initially negative times one and urinalysis negative.   IMPRESSION:  Chest pain:  His symptoms are concerning for unstable  angina as they started with minimal activity and were relieved with  nitroglycerin.  He will be admitted and we will add heparin to his  medication regimen with IV nitroglycerin reserved for recurrent  symptoms.  He will be continued on his other medications.  Dr. Eden Emms  has  been in to see Mr. Adan and feels that cardiac catheterization is  indicated to define his anatomy.  He is on schedule for later today.  He  will be continued on all of his home medications and we will check a TSH  as well as a lipid profile.      Dustin Demark, PA-C      Noralyn Pick.  Eden Emms, MD, [facility]  Electronically Signed    RB/MEDQ  D:  12/28/2007  T:  12/28/2007  Job:  [id number]   cc:   Margaretmary Bayley, M.D.

## 2011-02-12 NOTE — Assessment & Plan Note (Signed)
[facility]                            CARDIOLOGY OFFICE NOTE   Dustin Hess, Dustin Hess               MRN:          [id number]  DATE:11/03/2008                            DOB:          06-15-1946    PRIMARY CARE PHYSICIAN:  Margaretmary Bayley, MD   REASON FOR PRESENTATION:  Evaluate the patient with chest discomfort.   HISTORY OF PRESENT ILLNESS:  The patient returns for 14-month followup.  I had increased his Imdur at the last visit because he was still having  some chest discomfort.  He had a stress perfusion study following that  and it demonstrated that he had EF of 67% with no evidence of ischemia  or infarct.  This is a followup to the catheterization he had last year.  He still is getting some chest discomfort with exertion.  He states that  it happens if he walks briskly to the store.  If he does activities such  as rushing around, it is a dull discomfort.  He has taken about 6 or 7  nitroglycerin in the last month.  He takes one and discomfort goes away.  He may wait several minutes without taking a nitroglycerin and the  discomfort goes away.  At its peak it is 5/10.  It is dull.  It does not  radiate.  It is similar to previous angina.  He does not think that the  pattern has changed since his January visit when I did the stress test  and increased his Imdur.  He has not had any resting symptoms.  He has  had no associated nausea, vomiting, or diaphoresis.  He has had no  palpitation, presyncope, or syncope.  He denies any PND or orthopnea.   PAST MEDICAL HISTORY:  1. Coronary artery disease (see October 06, 2008, note for details.  He      had his last catheterization in June 2009.  He has well-preserved      ejection fraction).  2. Hypothyroidism.  3. Gastrointestinal bleeding in 2006.  4. Congenital blindness.  5. Hypertension.  6. Dyslipidemia.  7. Benign prostatic hypertrophy.   ALLERGIES:  None.   MEDICATIONS:  1. Synthroid  100 mcg daily.  2. Metoprolol 100 mg daily.  3. Avalide.  4. Lipitor 20 mg at bedtime.  5. Flomax 0.8 mg at bedtime.  6. Iron.  7. Omeprazole 20 mg daily.  8. Aspirin 325 mg daily.  9. Isosorbide 60 mg daily.   REVIEW OF SYSTEMS:  As stated in the HPI, and otherwise negative for  other systems.   PHYSICAL EXAMINATION:  GENERAL:  The patient is in no distress.  VITAL SIGNS:  Blood pressure 112/78, heart rate 79 and regular, weight  170 pounds, and body mass index 29.  HEENT:  Fundi not visualized; the pupils are nonreactive; the eyelids  are unremarkable, oral mucosa is unremarkable.  NECK:  No jugular venous distention at 45 degrees; carotid upstroke  brisk and symmetric; no bruits, no thyromegaly.  LYMPHATICS:  No cervical, axillary, or inguinal adenopathy.  LUNGS:  Clear to auscultation bilaterally.  BACK:  No costovertebral angle tenderness.  CHEST:  Unremarkable.  HEART:  PMI not displaced or sustained; S1 and S2 within normal limits;  no S3, no S4; no clicks, no rubs, no murmurs.  ABDOMEN:  Flat; positive  bowel sounds; normal in frequency and pitch; no bruits, no rebound, no  guarding; no midline pulsatile mass; no organomegaly.  SKIN:  No rashes.  No nodules.  EXTREMITIES:  Pulses 2+ throughout; no edema, no cyanosis, no clubbing.  NEURO:  Oriented to person, place, and time; cranial nerves intact  except for optic nerve, motor grossly intact.   EKG; sinus rhythm, rate 79, left axis deviation, sinus arrhythmia, no  acute ST-T wave changes.   ASSESSMENT AND PLAN:  1. Chest discomfort.  The patient is still having chest discomfort,      but he has had a cath demonstrating small vessel disease and      nonobstructive disease.  He has had negative stress perfusion      study.  It still sounds like angina.  I am going to continue to      manage him medically and try to increase his Imdur to 120 mg daily.      However, if he has any worsening symptoms I may be forced to  recath      and reconsider intervening on small vessels if possible.  He will      continue secondary risk reduction.  2. Hypertension.  Blood pressure is well controlled and he will      continue the meds as listed.  3. Dyslipidemia per Dr. Chestine Spore with goal LDL less than 100 and HDL      greater than 40.  4. Hypothyroidism.  This is managed by Dr. Chestine Spore.  5. Followup.  I would like to see him again in 6 weeks to see if the      med changes made any difference.     Rollene Rotunda, MD, [facility]  Electronically Signed    JH/MedQ  DD: 11/03/2008  DT: 11/03/2008  Job #: [id number]   cc:   Margaretmary Bayley, M.D.

## 2011-02-15 NOTE — Discharge Summary (Signed)
NAME:  WREN, GALLAGA NO.:  [id number]   MEDICAL RECORD NO.:  [id number]          PATIENT TYPE:  INP   LOCATION:  4709                         FACILITY:  [facility]   PHYSICIAN:  Margaretmary Bayley, M.D.    DATE OF BIRTH:  1946/04/03   DATE OF ADMISSION:  09/18/2005  DATE OF DISCHARGE:  09/24/2005                                 DISCHARGE SUMMARY   DISCHARGE DIAGNOSES:  1.  Upper gastrointestinal bleeding without identifiable site.  2.  History of coronary artery disease with a previous percutaneous      transluminal coronary angioplasty.  3.  Primary hypothyroidism.  4.  Old myocardial infarction.  5.  Chronic use of aspirin.  6.  Congenital blindness.   REASON FOR ADMISSION:  This is one of several [facility] hospitalizations  for Mr. Stegmann, a 65 year old African-American gentleman who was  brought into the emergency room by family that had been present when he  noted significant fatigue and what he describes as tiredness and then  developed some diarrhea.  Because he is blind, the patient did not realize  that what he thought was diarrhea was, in fact, melanotic stool, and when he  was evaluated in the emergency room, he was noted to have a blood pressure  of 100/50, which was low for him, and that his feeling of faintness and  weakness was, in fact, related to a low blood pressure.  As a matter of  fact, his blood pressure reached a nadir of about 60/35 with a pulse rate of  greater than 125 before enough volume of fluids and blood replacement  products were given to stabilize him.  Of importance is the fact that the  patient gives no history of alcoholism, no nonsteroidal anti-inflammatory  agents, but he does take a baby aspirin and has had no previous history of  peptic ulcer disease.  Because of his hemodynamically unstable vital signs,  he was admitted to medical ICU and started on a Protonix drip under the  guidance of Dr. Bosie Clos of the GI service.  He  was given several units of  red blood cells to maintain a hematocrit of about 28-29 and with IV fluids,  his blood pressure remained fairly steady over the first 245 hours.   After that initial bleed, the patient had no further or any obvious bleeding  noted.  His stools continued to be somewhat dark, but he did not have the  volume.  He had no complaints of abdominal pain.  As noted, a GI consultant  was brought in.  Dr. Randa Evens was kind enough to see him for [place] and  subsequently took the patient to the OR, where the patient underwent upper  and lower endoscopy but without any obvious source of the bleeding.  He was  empirically treated with initially IV Protonix and then converted over to  p.o. Protonix while we gradually advanced his diet from clear liquids back  to a regular diet.  He did have orthostatic changes in blood pressure as  noted for the first 36 hours; however, after the second hospital day his  pressures stabilized.  He  was able to get up and move about without getting  dizzy.  No further bleeding was noted, and it was the opinion of Dr. Randa Evens  that in view of the patient's negative endoscopy and the fact that he was no  longer bleeding, that a red blood cell scan probably would not be helpful  and that we should at this point in time let the patient go home, not taking  aspirin or any other antiplatelet agent.  He was scheduled to be seen back  by the GI consultants in their office in the next four weeks and to be seen  in my office in two weeks, where we will again recheck his pressures and his  hematocrit.   He was instructed to resume his Toprol at 50 mg per day and Synthroid at 0.1  mg per day, Flomax 0.4 mg at bedtime, and Lipitor 20 mg per day as before  his hospitalization.  He was instructed definitely not to take any aspirin  but was asked to use some ferrous gluconate 300 mg twice a day.  Miralax was  felt to be indicated by GI medicine, and he was instructed  on the use of 17  g in 8 ounces of water once a day along with Protonix at 40 mg per day.   CONDITION ON DISCHARGE:  Much improved.  His prognosis is still a question  because we do not have an underlying etiology for his GI bleeding.   He is discharged on a 3-4 g modified fat-restricted diet.           ______________________________  Margaretmary Bayley, M.D.     PC/MEDQ  D:  11/21/2005  T:  11/22/2005  Job:  [id number]

## 2011-02-15 NOTE — Cardiovascular Report (Signed)
[place]. [facility]  Patient:    Dustin Hess, Dustin Hess Visit Number: [id number] MRN: [id number]          Service Type: MED Location: CCUA 2921 01 Attending Physician:  Rollene Rotunda Dictated by:   Everardo Beals Juanda Chance, M.D. [facility] Proc. Date: 07/12/01 Admit Date:  07/12/2001   CC:         [name]. Chestine Spore, M.D.  Rollene Rotunda, M.D. [facility]  Cardiac Catheterization Lab   Cardiac Catheterization  CLINICAL HISTORY:  Dustin Hess is 65 years old and has no prior history of known heart disease.  He had the onset of chest pain at 9:30 this morning and presented to [facility] Emergency Room where the ECG showed an acute inferior wall infarction.  He was transferred to [facility] for intervention but his intervention was delayed because the lab was tied up with another emergency procedure and the procedure on Dustin Hess had to be delayed about an hour.  Dustin Hess was enrolled in the AIMI trial.  DESCRIPTION OF PROCEDURE:  The procedure was performed via the right femoral artery using an arterial sheath and 6-French preformed coronary catheters.  A front wall arterial puncture was performed.  Omnipaque contrast was used.  At the completion of the diagnostic study, we made a decision to proceed with intervention on the right coronary artery.  The patient was randomized to no Angiojet as part of the AIMI trial.  He was given weight-adjusted heparin to prolong an ACT of greater than 200 seconds and was given double bolus Integrilin and infusion.  We used a JR4 7-French guiding catheter and a short Floppy wire.  We crossed the lesion in the mid right coronary artery with the wire without difficulty.  We direct-stented with a 3.5- x 18-mm Zeta stent deploying this initially with one inflation of 15 atmospheres for 30 seconds and a second inflation of 17 atmospheres for 25 seconds with the balloon pulled inside the distal edge.  Repeat diagnostic study  was then performed through the guiding catheter.  The patient tolerated the procedure well and left the laboratory in satisfactory condition.  RESULTS: 1. The left main coronary artery:  The left main coronary artery was free of    significant disease. 2. The left anterior descending artery:  The left anterior descending    artery gave rise to a diagonal branch and three septal perforators.  The    diagonal branch was irregular but there was no major obstruction in either    the diagonal or the LAD. 3. The circumflex artery:  The circumflex artery gave rise to a large marginal    branch, a small marginal branch, and a posterolateral branch.  There was    99% stenosis before the posterolateral branch with TIMI-1 flow antegrade    and some collateral filling as well. 4. The right coronary artery:  The right coronary artery was a large dominant    vessel that gave rise to a right ventricular branch, a posterior descending    branch, and a posterolateral branch.  There was 99% stenosis in the mid to    distal vessel with TIMI-2 flow distally and a filling defect felt to    represent thrombus just distal to the tight obstruction.  There was also    70% narrowing in the mid portion of the posterior descending branch.  Left ventriculogram:  The left ventriculogram, performed in the RAO projection, showed minimal hypokinesis of a mid inferior wall.  The  overall wall motion was quite good with an estimated ejection fraction of 60%.  Following stenting of the mid to distal right coronary artery stenosis, the stenosis improved from 99% to less than 10% and the flow improved from TIMI-2 to TIMI-3 flow.  The patient had the onset of chest pain at 9:30 and arrived at [facility] Emergency Room at 11:15.  The first balloon inflation was performed at 1414. This gave a door-to-balloon time of two hours and 59 minutes and a reperfusion time of four hours and 44 minutes.  IMPRESSION: 1. Acute  diaphragmatic wall infarction with a 99% stenosis in the mid to    distal right coronary artery, 70% stenosis in the posterior descending    branch of the right coronary artery, a 99% stenosis in the distal    circumflex, and irregularities in the diagonal branch to the left anterior    descending artery with minimal inferior wall hypokinesis. 2. Successful stent deployment in the mid to distal right coronary artery with    improvement in percent area narrowing from 99% to less than 10% and    improvement in the flow from TIMI-2 to TIMI-3 flow.  DISPOSITION:  The patient was transferred to the post angioplasty unit for further observation.  Will plan intervention on the circumflex artery in the next few days. Dictated by:   Everardo Beals Juanda Chance, M.D. [facility] Attending Physician:  Rollene Rotunda DD:  07/12/04 TD:  07/12/01 Job: [id number]

## 2011-02-15 NOTE — Discharge Summary (Signed)
[place]. [facility]  Patient:    Dustin Hess, Dustin Hess Visit Number: [id number] MRN: [id number]          Service Type: MED Location: CCUA 2921 01 Attending Physician:  Rollene Rotunda Dictated by:   Rozell Searing, P.A. Admit Date:  07/12/2001 Discharge Date: 07/16/2001   CC:         [name]. Chestine Spore, M.D.   Referring Physician Discharge [facility]  PROCEDURES: 1. Emergent stent of right coronary artery on July 12, 2001. 2. Elective stent of circumflex on July 15, 2001.  REASON FOR ADMISSION:  The patient is a 65 year old male with prior history of heart disease with cardiac risk factors notable for hypertension.  He presented with a two-week history of intermittent chest pain.  He had apparently presented to the [facility] ER two days prior to his [facility]ideg[facility] presentation with chest pain, but was found to have no objective evidence of ischemia.  He returned complain of intermittent chest pain since the previous evening, which worsened a few hours prior to his arrival.  The admission EKG revealed ST elevation inferiorly with ST depression in leads V1-V3.  He was stabilized with aspirin, beta blocker, intravenous nitroglycerin, and heparin and transferred directly to [facility] for emergent intervention.  LABORATORY DATA:  Cardiac enzymes:  Peak CPK 265/18.5, troponin I 0.02.  Lipid profile:  Total cholesterol 174, triglycerides 206, HDL 28, LDL 105 (cholesterol/HDL ratio 6.2).  TSH normal.  The CBC and metabolic profile were normal at discharge.  Admission CXR:  Normal.  HOSPITAL COURSE:  The patient was taken directly to the [facility] Cardiac Catheterization Lab where he underwent emergent coronary angiography and intervention of a 99% mid RCA lesion with thrombus.  Bruce Elvera Lennox Juanda Chance, M.D., successfully reduced to less than 10% stenosis after stent deployment.  The patient was  enrolled in the AIMI trial and was randomized to the Angiojet arm.  Residual anatomy notable for a 99% distal circumflex lesion with otherwise normal LAD and normal LV function.  The patient was taken back to the laboratory on July 15, 2001, for elective stenting of the tight circumflex lesion to 0% residual stenosis.  No complications were noted and he was cleared for discharge the following day.  MEDICATION ADJUSTMENTS:  The patient was on Toprol prior to admission for management of hypertension.  All other medications are new.  MEDICATIONS AT DISCHARGE: 1. Altace 2.5 mg b.i.d. 2. Toprol XL 100 mg q.d. 3. Plavix 75 mg q.d. (x 4 weeks). 4. Pravachol 20 mg q.h.s. 5. Coated aspirin 325 mg q.d. 6. Nitrostat 0.4 mg as directed.  ACTIVITY:  No heavy lifting/driving/work x 1 month.  DIET:  Maintain a low-fat/cholesterol diet.  WOUND CARE:  Call the office if there is any bleeding/swelling of the groin.  FOLLOW-UP:  The patient will be scheduled to follow up with Rollene Rotunda, M.D., in two weeks.  DISCHARGE DIAGNOSES: 1. Acute inferior myocardial infarction.    a. Emergent stent, 99%, at mid right coronary artery on July 12, 2001       (AIMI trial:  No Angiojet).    b. Residual 99% distal circumflex treated with stenting on July 15, 2001.    c. Normal left ventricular function. 2. Dyslipidemia. 3. History of hypertension. Dictated by:   Rozell Searing, P.A. Attending Physician:  Rollene Rotunda DD:  07/16/01 TD:  07/16/01 Job: [id number]

## 2011-02-15 NOTE — Op Note (Signed)
NAME:  Dustin Hess, BURGO NO.:  [id number]   MEDICAL RECORD NO.:  [id number]          PATIENT TYPE:  INP   LOCATION:  2916                         FACILITY:  [facility]   PHYSICIAN:  Fayrene Fearing L. Malon Kindle., M.D.DATE OF BIRTH:  1946-04-18   DATE OF PROCEDURE:  09/19/2005  DATE OF DISCHARGE:                                 OPERATIVE REPORT   PROCEDURE:  Colonoscopy.   ENDOSCOPIST:  Llana Aliment. Edwards, M.D.   MEDICATIONS:  Fentanyl 100 mcg, Versed 4 mg IV.   INDICATION:  Acute GI bleeding with negative upper endoscopy.   DESCRIPTION OF PROCEDURE:  The procedure was explained to the patient and  consent obtained.  With the patient in the left lateral decubitus position,  the pediatric adjustable scope was inserted.  The patient had a large amount  of clots and old blood in the colon throughout.  He had an extremely long,  tortuous colon with a large diameter similar to a cathartic colon.  Using  abdominal pressure, we were able to eventually reach the cecum.  Old blood  persisted up to the cecum.  After some time, I intubated the ileocecal valve  and the terminal ileum was normal with no signs of active bleeding or old  bleed.  The scope was withdrawn and the colon carefully examined.  We  irrigated vigorously in the right colon; no AVMs were seen, no active  bleeding and the mucosal pattern was completely normal throughout.  Vigorous  irrigation of approximately 2 L or more of fluid were sucked out and we were  unable to see any active bleeding.  No diverticulosis was seen.  The rectum  had old blood and clots, but no active bleeding.   ASSESSMENT:  Lower gastrointestinal bleed with no signs of recent bleeding  in the terminal ileum, but blood throughout the colon with no clear bleeding  site seen.   PLAN:  We will treat the patient clinically, keep him on clear liquids and  laxatives.  If he continues to bleed, he may need a bleeding scan or a  repeat colonoscopy.           ______________________________  Llana Aliment. Malon Kindle., M.D.     Waldron Session  D:  09/19/2005  T:  09/21/2005  Job:  [id number]   cc:   Margaretmary Bayley, M.D.  Fax: [phone]

## 2011-02-15 NOTE — Consult Note (Signed)
NAME:  Dustin Hess, CANAVAN NO.:  [id number]   MEDICAL RECORD NO.:  [id number]          PATIENT TYPE:  INP   LOCATION:  1825                         FACILITY:  [facility]   PHYSICIAN:  James L. Malon Kindle., M.D.DATE OF BIRTH:  June 04, 1946   DATE OF CONSULTATION:  09/18/2005  DATE OF DISCHARGE:                                   CONSULTATION   REFERRING PHYSICIAN:  Margaretmary Bayley, M.D.   HISTORY:  A delightful 65 year old [place] American male who had been in his  usual state of health.  He is blind and works for a [employer].  He was at work yesterday, had diarrhea and he could not tell if it was  melenic or red, and felt progressively weak and then passed bloody stools  during the night with bright red blood.  He came into the emergency room.  He was hypotensive and given IV fluids and his pressure had come up nicely.  He did have the bloody stools at home.  This was seen by his family who  gives some of the history here today.  The patient denies any epigastric  pain, dyspepsia, has not ever had ulcers.  He typically is intermittently  constipated but has not been constipated lately. He has never had any colon  evaluation.   CURRENT MEDICATIONS:  1.  Synthroid.  2.  Toprol.  3.  Aspirin 81 mg daily.  4.  Flomax.  5.  Lipitor.   ALLERGIES:  No known drug allergies.   PAST MEDICAL HISTORY:  History of coronary disease.  He has had previous  angioplasty and stents.  He reports that his heart is doing well he thinks.   PAST SURGICAL HISTORY:  The only surgery he has had in the past has been  hemorrhoid surgery.   FAMILY HISTORY:  Mother died of renal cancer.  Father died of some sort of  blood cancer. He is unable to tell me any more information.  He has no known  family history of colon cancer.   SOCIAL HISTORY:  He is married.  He has been blind since approximately early  1970s.  He noted he could see prior to that and actually attended school for  several years.  He is not able to tell me the etiology of his blindness.   REVIEW OF SYSTEMS:  GI review of systems is noted above.   PHYSICAL EXAMINATION:  VITAL SIGNS:  The patient is afebrile.  Pulse  currently 122.  Blood pressure 124/73 up from previous systolic in the 80s  and 90s.  Patient has IV fluids running and is receiving blood wide open at  this time.  HEENT:  Eyes:  Obvious impairment.  He can barely open his eyes.  Throat:  Mucous membranes are dry.  NECK:  Supple.  No lymphadenopathy.  LUNGS:  Clear.  CARDIOVASCULAR:  Regular rate and rhythm without murmurs or gallops.  ABDOMEN:  Soft and nontender.  RECTAL:  Not repeated.  Stool was bright red by the ER physician.   PERTINENT LABORATORY DATA:  Hemoglobin 9.5 on admission to the emergency  room.  BUN 33, creatinine 1.1.  Cardiac markers were negative.   ASSESSMENT:  Acute gastrointestinal bleed.  Elevated BUN and creatinine  could indicate an upper source.  He has been on 81 mg of aspirin. I think we  need to rule that out first.  My suspicion, however, is that this is  probably lower gastrointestinal in origin and possibly diverticular.   PLAN:  I have discussed this in detail with the patient and his family,  specifically his wife and niece.  We will proceed with an upper endoscopy  today.  I agree with empiric proton pump inhibitors which has been done and  agree with transfusion.           ______________________________  Llana Aliment Malon Kindle., M.D.     Waldron Session  D:  09/18/2005  T:  09/20/2005  Job:  [id number]

## 2011-02-15 NOTE — Op Note (Signed)
NAME:  Dustin Hess, DOCKENDORF NO.:  [id number]   MEDICAL RECORD NO.:  [id number]          PATIENT TYPE:  INP   LOCATION:  2916                         FACILITY:  [facility]   PHYSICIAN:  Fayrene Fearing L. Malon Kindle., M.D.DATE OF BIRTH:  03-22-1946   DATE OF PROCEDURE:  09/18/2005  DATE OF DISCHARGE:                                 OPERATIVE REPORT   PROCEDURE:  Upper endoscopy.   ENDOSCOPIST:  Llana Aliment. Randa Evens, M.D.   MEDICATIONS:  Cetacaine spray.  Fentanyl 25 mcg, Versed 1.5 mg IV.   INDICATIONS:  Acute GI bleed.  This was done to rule out an upper source.  The patient's BUN is slightly elevated.   DESCRIPTION OF PROCEDURE:  The procedure explained to the patient and  consent obtained.  With the patient in the left lateral decubitus position  the Olympus scope was inserted and advanced.  The stomach was entered and  the pylorus identified and passed.  The duodenum including the bulb and  second portion was normal.  There were no signs of any upper GI bleeding.  The pyloric channel, antrum and body were normal.  No ulcerations or  inflammation.  The fundus and cardia seem normal on retroflex view and were  normal.  The distal and proximal esophagus were endoscopically normal.  No  coffee-grounds material or any signs of bleeding whatsoever.   ASSESSMENT:  Acute gastrointestinal bleed.  No signs of upper source on  endoscopy, probably lower gastrointestinal.  578.1.   PLAN:  We will proceed with colonoscopy tomorrow as planned.           ______________________________  Llana Aliment. Malon Kindle., M.D.     Waldron Session  D:  09/18/2005  T:  09/20/2005  Job:  [id number]   cc:   Margaretmary Bayley, M.D.  Fax: [phone]

## 2011-02-15 NOTE — Cardiovascular Report (Signed)
[place]. [facility]  Patient:    Dustin Hess, Dustin Hess Visit Number: [id number] MRN: [id number]          Service Type: MED Location: CCUA 2921 01 Attending Physician:  Rollene Rotunda Proc. Date: 07/15/01 Admit Date:  07/12/2001 Discharge Date: 07/16/2001   CC:         [name]. Chestine Spore, M.D.  Rollene Rotunda, M.D. [facility]   Cardiac Catheterization  INDICATIONS:  [name] is 65 years old and was admitted on October 13, with an acute diaphragmatic wall infarction and had the right coronary artery stented.  He was in the AIMI Trial and was randomized to no angiojet.  He was brought back today for a treatment of a 99 to 100% occlusion of the distal circumflex artery which I thought was probably a chronic total occlusion.  DESCRIPTION OF PROCEDURE:  The procedure was performed via the left femoral artery using arterial sheath and a Voda 3.5 x 7 [place] guiding catheter with sideholes.  We first took pictures of the right coronary artery and left ventriculogram to document patency and recovery of left ventricular function. The patient was given weight-adjusted heparin upon ACT greater than 200 seconds and we withheld Integrilin until we crossed the lesion.  We had difficulty crossing with the wire when we first tried a Graphics PT and were unable to cross.  We used the help of a 2.0 x 15 mm Maverick for support and still were unable to cross.  We were finally able to cross with a 200 Crossit wire.  We then exchanged for a long Luge wire.  We dilated with a 2.0 x 15 mm Maverick performing several inflations up to 10 atmospheres for 30 seconds. We then removed the balloon and deployed a 2.25 x 18 mm Pixel deploying this with one inflation of 9 atmospheres for 30 seconds and a second inflation of 12 atmospheres for 28 seconds with the balloon pulled inside the distal edge. Repeat diagnostic study was then performed through the guiding catheter.   The patient tolerated the procedure well and left the laboratory in satisfactory condition.  The right coronary artery was widely patent at the stent site with no stenosis.  The left ventriculogram performed in the RAO projection showed minimal hypokinesis of the inferior wall with an estimated ejection fraction of 60%.  Following stenting of the distal circumflex artery, the stenosis improved from 100% to 0%.  CONCLUSION:  Successful stenting of the chronically totally occluded distal circumflex artery with improvement in percent diameter narrowing from 100% to 0%.  DISPOSITION:  The patient is returned to the recovery room for further observation. Attending Physician:  Rollene Rotunda DD:  07/12/01 TD:  07/15/01 Job: [id number]

## 2011-02-18 ENCOUNTER — Other Ambulatory Visit: Payer: Self-pay | Admitting: Cardiology

## 2011-04-08 ENCOUNTER — Other Ambulatory Visit: Payer: Self-pay | Admitting: Cardiology

## 2011-05-06 ENCOUNTER — Encounter: Payer: Self-pay | Admitting: Cardiology

## 2011-06-04 ENCOUNTER — Encounter: Payer: Self-pay | Admitting: Cardiology

## 2011-06-04 ENCOUNTER — Ambulatory Visit (INDEPENDENT_AMBULATORY_CARE_PROVIDER_SITE_OTHER): Payer: BC Managed Care – PPO | Admitting: Cardiology

## 2011-06-04 DIAGNOSIS — I10 Essential (primary) hypertension: Secondary | ICD-10-CM

## 2011-06-04 DIAGNOSIS — E785 Hyperlipidemia, unspecified: Secondary | ICD-10-CM

## 2011-06-04 DIAGNOSIS — I2510 Atherosclerotic heart disease of native coronary artery without angina pectoris: Secondary | ICD-10-CM

## 2011-06-04 NOTE — Assessment & Plan Note (Signed)
Given the patient's previous coronary disease stress testing is indicated. I would like to do this prior to having enlarged on a more rigorous exercise regimen. He wouldn't be able to walk a treadmill with his blindness. Therefore, he will need stress perfusion imaging.

## 2011-06-04 NOTE — Assessment & Plan Note (Signed)
I don't see any recent blood work. We'll check a fasting lipid profile when he returns for his stress test. The goal will be an LDL less than 100 and HDL greater than 40.

## 2011-06-04 NOTE — Patient Instructions (Signed)
Your physician has requested that you have a lexiscan myoview. For further information please visit [email or web address] Please follow instruction sheet, as given.  Your physician recommends that you return for a FASTING lipid profile: the same day as your stress test.  Continue current medications  Follow up in 1 year with Dr Antoine Poche.  You will receive a letter in the mail 2 months before you are due.  Please call us when you receive this letter to schedule your follow up appointment.

## 2011-06-04 NOTE — Progress Notes (Signed)
HPI The patient presents for followup of his known coronary disease.  Since I last saw him he has done relatively well. He has a stationary bike in his house but he hasn't been exercising. With his current level of activity which is somewhat limited by his blindness he does not report and cardiovascular symptoms. The patient denies any new symptoms such as chest discomfort, neck or arm discomfort. There has been no new shortness of breath, PND or orthopnea. There have been no reported palpitations, presyncope or syncope.    No Known Allergies  Current Outpatient Prescriptions  Medication Sig Dispense Refill  . aspirin 81 MG tablet Take 81 mg by mouth daily.        Marland Kitchen atorvastatin (LIPITOR) 20 MG tablet TAKE ONE (1) TABLET EACH DAY                                                  Generic for LIPITOR  30 tablet  6  . cetirizine (ZYRTEC) 10 MG tablet Take 10 mg by mouth as needed.        . Cholecalciferol (VITAMIN D3) 1000 UNITS CAPS Take 1 capsule by mouth daily.        Marland Kitchen dutasteride (AVODART) 0.5 MG capsule Take 0.5 mg by mouth daily.        . isosorbide mononitrate (IMDUR) 120 MG 24 hr tablet TAKE ONE (1) TABLET EACH DAY                                                  GENERIC FOR IMDUR  30 tablet  6  . levothyroxine (SYNTHROID, LEVOTHROID) 100 MCG tablet Take 100 mcg by mouth daily.        . metoprolol (TOPROL-XL) 100 MG 24 hr tablet TAKE ONE (1) TABLET EACH DAY                                                  Generic for TOPROL X  30 tablet  6  . nitroGLYCERIN (NITROSTAT) 0.4 MG SL tablet Place 0.4 mg under the tongue every 5 (five) minutes as needed.        Marland Kitchen omeprazole (PRILOSEC) 20 MG capsule Take 20 mg by mouth daily.        . Tamsulosin HCl (FLOMAX) 0.4 MG CAPS Take 0.4 mg by mouth daily.          Past Medical History  Diagnosis Date  . Coronary artery disease     Cath June 2009, demonstrate left mild calcification, LAD had luminal irregularities, 60% stenosis in the distal portion,  circumflex had a stent in the AV groove which was via the patent. The RCA had a mid stent which is via th epatent. EF 60%. THere was posterolateral branch with 70% stenosis that was near in caliber. The EF of 65%  . Hypothyroidism   . GI bleeding 2006  . Congenital blindness   . Hypertension   . Dyslipidemia   . Benign prostatic hypertrophy     Past Surgical History  Procedure Date  . Esophagogastroduodenoscopy   .  Colonoscopy   . Hemorrhoid surgery     ROS: Blind.  Otherwise as stated in the HPI and negative for all other systems.  PHYSICAL EXAM BP 154/88  Pulse 78  Resp 18  Ht 5\' 5"  (1.651 m)  Wt 173 lb (78.472 kg)  BMI 28.79 kg/m2 GENERAL:  Well appearing HEENT:  Pupils nonreactive, fundi not visualized, lens opacification, oral mucosa unremarkable NECK:  No jugular venous distention, waveform within normal limits, carotid upstroke brisk and symmetric, no bruits, no thyromegaly LYMPHATICS:  No cervical, inguinal adenopathy LUNGS:  Clear to auscultation bilaterally BACK:  No CVA tenderness CHEST:  Unremarkable HEART:  PMI not displaced or sustained,S1 and S2 within normal limits, no S3, no S4, no clicks, no rubs, no murmurs ABD:  Flat, positive bowel sounds normal in frequency in pitch, no bruits, no rebound, no guarding, no midline pulsatile mass, no hepatomegaly, no splenomegaly EXT:  2 plus pulses throughout, no edema, no cyanosis no clubbing SKIN:  No rashes no nodules NEURO:  Cranial nerves II through XII grossly intact, motor grossly intact throughout PSYCH:  Cognitively intact, oriented to person place and time  EKG:  NSR, leftward axis, possible RAE, no acute ST T wave changes  ASSESSMENT AND PLAN

## 2011-06-04 NOTE — Assessment & Plan Note (Signed)
His blood pressure is elevated today. He thinks it's been controlled when he gets it checked at work. 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

## 2011-06-11 ENCOUNTER — Encounter: Payer: Self-pay | Admitting: *Deleted

## 2011-06-19 ENCOUNTER — Other Ambulatory Visit (INDEPENDENT_AMBULATORY_CARE_PROVIDER_SITE_OTHER): Payer: BC Managed Care – PPO | Admitting: *Deleted

## 2011-06-19 ENCOUNTER — Ambulatory Visit (HOSPITAL_COMMUNITY): Payer: BC Managed Care – PPO | Attending: Cardiology | Admitting: Radiology

## 2011-06-19 VITALS — Ht 65.0 in | Wt 170.0 lb

## 2011-06-19 DIAGNOSIS — E785 Hyperlipidemia, unspecified: Secondary | ICD-10-CM

## 2011-06-19 DIAGNOSIS — H543 Unqualified visual loss, both eyes: Secondary | ICD-10-CM | POA: Insufficient documentation

## 2011-06-19 DIAGNOSIS — I252 Old myocardial infarction: Secondary | ICD-10-CM

## 2011-06-19 DIAGNOSIS — I2510 Atherosclerotic heart disease of native coronary artery without angina pectoris: Secondary | ICD-10-CM | POA: Insufficient documentation

## 2011-06-19 DIAGNOSIS — R079 Chest pain, unspecified: Secondary | ICD-10-CM

## 2011-06-19 LAB — LIPID PANEL
HDL: 28.7 mg/dL — ABNORMAL LOW (ref >39.00)
LDL Cholesterol: 80 mg/dL (ref 0–99)
Total CHOL/HDL Ratio: 4
VLDL: 15.4 mg/dL (ref 0.0–40.0)

## 2011-06-19 MED ORDER — TECHNETIUM TC 99M TETROFOSMIN IV KIT
10.5000 | PACK | Freq: Once | INTRAVENOUS | Status: AC | PRN
  Administered 2011-06-19: 11 via INTRAVENOUS

## 2011-06-19 MED ORDER — TECHNETIUM TC 99M TETROFOSMIN IV KIT
33.0000 | PACK | Freq: Once | INTRAVENOUS | Status: AC | PRN
  Administered 2011-06-19: 33 via INTRAVENOUS

## 2011-06-19 MED ORDER — REGADENOSON 0.4 MG/5ML IV SOLN
0.4000 mg | Freq: Once | INTRAVENOUS | Status: AC
  Administered 2011-06-19: 0.4 mg via INTRAVENOUS

## 2011-06-19 NOTE — Progress Notes (Signed)
[facility] SITE 3 NUCLEAR MED [address] [place] [address] [phone]  Cardiology Nuclear Med Study  Dustin Hess is a 65 y.o. male [id number] 04-27-1946  Nuclear Med Background Indication for Stress Test:  Evaluation for Ischemia and Stent Patency History:  '02 DMI> stents CFX, RCA, '09 Cath: CFX stent patent, 60% in-stent RCA,70% PDA, EF=65 (Med [place]), '09 Echo: EF=55%, 10/13/08 MPS: NL, EF=67% Cardiac Risk Factors: History of Smoking, Hypertension and Lipids  Symptoms:  Chest Pain with and without Exertion (last date of chest discomfort 2 months ago per patient), Fatigue and Fatigue with Exertion   Nuclear Pre-Procedure Caffeine/Decaff Intake:  None NPO After: 7:00pm   Lungs:  Clear IV 0.9% NS with Angio Cath:  20g  IV Site: R Hand  IV Started by:  Bonnita Levan, RN  Chest Size (in):  46 Cup Size: n/a  Height: 5\' 5"  (1.651 m)  Weight:  170 lb (77.111 kg)  BMI:  Body mass index is 28.29 kg/(m^2). Tech Comments:  No toprol today    Nuclear Med Study 1 or 2 day study: 1 day  Stress Test Type:  Lexiscan  Reading MD: Olga Millers, MD  Order Authorizing Provider:  Dr. Daiva Nakayama  Resting Radionuclide: Technetium 59m Tetrofosmin  Resting Radionuclide Dose: 10.5 mCi   Stress Radionuclide:  Technetium 30m Tetrofosmin  Stress Radionuclide Dose: 33 mCi           Stress Protocol Rest HR: 71 Stress HR: 115/59  Rest BP: 165/96 Stress BP: 115/59  Exercise Time (min): n/a METS: n/a   Predicted Max HR: 154 bpm % Max HR: 69.48 bpm Rate Pressure Product: 24401   Dose of Adenosine (mg):  n/a Dose of Lexiscan: 0.4 mg  Dose of Atropine (mg): n/a Dose of Dobutamine: n/a mcg/kg/min (at max HR)  Stress Test Technologist: Irean Hong, RN  Nuclear Technologist:  Domenic Polite, CNMT     Rest Procedure:  Myocardial perfusion imaging was performed at rest 45 minutes following the intravenous administration of Technetium 82m Tetrofosmin. Rest ECG: NSR  with sinus arrhythmia, PAC, poor R wave progresson  Stress Procedure:  The patient received IV Lexiscan 0.4 mg over 15-seconds.  Technetium 75m Tetrofosmin injected at 30-seconds.  There were nonspecific ST changes with Lexiscan. There was chest pain after lexiscan.  Quantitative spect images were obtained after a 45 minute delay. Stress ECG: No significant ST segment change suggestive of ischemia.  QPS Raw Data Images:  Acquisition technically good; normal left ventricular size. Stress Images:  Normal homogeneous uptake in all areas of the myocardium. Rest Images:  Normal homogeneous uptake in all areas of the myocardium. Subtraction (SDS):  No evidence of ischemia. Transient Ischemic Dilatation (Normal <1.22):  1.08 Lung/Heart Ratio (Normal <0.45):  .28  Quantitative Gated Spect Images QGS EDV:  75 ml QGS ESV:  25 ml QGS cine images:  NL LV Function; NL Wall Motion QGS EF: 67%  Impression Exercise Capacity:  Lexiscan with no exercise. BP Response:  Normal blood pressure response. Clinical Symptoms:  There is chest pain. ECG Impression:  No significant ST segment change suggestive of ischemia. Comparison with Prior Nuclear Study: No significant change from previous study  Overall Impression:  Normal stress nuclear study.  Olga Millers

## 2011-06-24 LAB — URINALYSIS, ROUTINE W REFLEX MICROSCOPIC
Bilirubin Urine: NEGATIVE
Ketones, ur: NEGATIVE
Nitrite: NEGATIVE
Protein, ur: NEGATIVE
Urobilinogen, UA: 1
pH: 7

## 2011-06-24 LAB — BASIC METABOLIC PANEL
BUN: 12
CO2: 25
CO2: 25
Calcium: 9
Creatinine, Ser: 0.96
Glucose, Bld: 121 — ABNORMAL HIGH
Glucose, Bld: 95
Potassium: 4.2
Sodium: 140
Sodium: 142

## 2011-06-24 LAB — DIFFERENTIAL
Basophils Relative: 0
Eosinophils Absolute: 0
Lymphs Abs: 0.3 — ABNORMAL LOW
Monocytes Absolute: 0.4
Monocytes Relative: 9
Neutrophils Relative %: 85 — ABNORMAL HIGH

## 2011-06-24 LAB — POCT CARDIAC MARKERS
CKMB, poc: <1 — ABNORMAL LOW
CKMB, poc: <1 — ABNORMAL LOW
Myoglobin, poc: 54.5
Myoglobin, poc: 66.2
Troponin i, poc: <0.05

## 2011-06-24 LAB — COMPREHENSIVE METABOLIC PANEL
ALT: 22
Albumin: 3.9
Alkaline Phosphatase: 75
Calcium: 9.3
GFR calc Af Amer: >60
Potassium: 4.5
Sodium: 139
Total Protein: 7

## 2011-06-24 LAB — APTT: aPTT: 33

## 2011-06-24 LAB — CK TOTAL AND CKMB (NOT AT ARMC): Relative Index: 1.6

## 2011-06-24 LAB — CBC
MCHC: 33.7
Platelets: 133 — ABNORMAL LOW
RDW: 12.7

## 2011-06-24 LAB — CARDIAC PANEL(CRET KIN+CKTOT+MB+TROPI)
CK, MB: 2.1
CK, MB: 2.1
Relative Index: 1.7
Total CK: 122
Total CK: 132
Troponin I: 0.04

## 2011-06-24 LAB — PROTIME-INR: INR: 1

## 2011-06-24 LAB — TROPONIN I: Troponin I: 0.03

## 2011-08-30 ENCOUNTER — Other Ambulatory Visit: Payer: Self-pay | Admitting: Cardiology

## 2011-09-25 ENCOUNTER — Other Ambulatory Visit: Payer: Self-pay | Admitting: Cardiology

## 2011-11-08 ENCOUNTER — Other Ambulatory Visit: Payer: Self-pay | Admitting: Cardiology

## 2011-11-08 MED ORDER — NITROGLYCERIN 0.4 MG SL SUBL: 0.4000 mg | SUBLINGUAL_TABLET | SUBLINGUAL | Status: DC | PRN

## 2011-11-08 NOTE — Telephone Encounter (Signed)
..   Requested Prescriptions   Pending Prescriptions Disp Refills  . nitroGLYCERIN (NITROSTAT) 0.4 MG SL tablet      Sig: Place 1 tablet (0.4 mg total) under the tongue every 5 (five) minutes as needed.

## 2011-11-09 ENCOUNTER — Other Ambulatory Visit: Payer: Self-pay | Admitting: Cardiology

## 2012-02-14 ENCOUNTER — Other Ambulatory Visit: Payer: Self-pay | Admitting: Internal Medicine

## 2012-02-14 ENCOUNTER — Ambulatory Visit (HOSPITAL_COMMUNITY)
Admission: RE | Admit: 2012-02-14 | Discharge: 2012-02-14 | Disposition: A | Discharge status: Home / Self Care | Payer: Medicare Other | Source: Ambulatory Visit | Attending: Internal Medicine | Admitting: Internal Medicine

## 2012-02-14 DIAGNOSIS — M25569 Pain in unspecified knee: Secondary | ICD-10-CM | POA: Insufficient documentation

## 2012-02-14 DIAGNOSIS — M25469 Effusion, unspecified knee: Secondary | ICD-10-CM | POA: Insufficient documentation

## 2012-02-14 DIAGNOSIS — R52 Pain, unspecified: Secondary | ICD-10-CM

## 2012-05-25 ENCOUNTER — Other Ambulatory Visit: Payer: Self-pay | Admitting: Cardiology

## 2012-05-25 NOTE — Telephone Encounter (Signed)
..   Requested Prescriptions   Pending Prescriptions Disp Refills  . metoprolol succinate (TOPROL-XL) 100 MG 24 hr tablet [Pharmacy Med Name: METOPROLOL SUCCINATE 100MG  TER] 30 tablet 3    Sig: TAKE ONE TABLET DAILY  .Marland Kitchen.patient needs to call office to make appointment for Sept or next open

## 2012-06-10 ENCOUNTER — Other Ambulatory Visit: Payer: Self-pay | Admitting: Cardiology

## 2012-06-10 NOTE — Telephone Encounter (Signed)
Pt out needs refill asap pt out

## 2012-06-10 NOTE — Telephone Encounter (Signed)
..   Requested Prescriptions   Pending Prescriptions Disp Refills  . isosorbide mononitrate (IMDUR) 120 MG 24 hr tablet [Pharmacy Med Name: ISOSORBIDE MONONITRA 120MG  TER] 30 tablet 6    Sig: TAKE ONE (1) TABLET EACH DAY

## 2012-06-29 ENCOUNTER — Encounter: Payer: Self-pay | Admitting: Cardiology

## 2012-06-29 ENCOUNTER — Ambulatory Visit (INDEPENDENT_AMBULATORY_CARE_PROVIDER_SITE_OTHER): Payer: Medicare Other | Admitting: Cardiology

## 2012-06-29 VITALS — BP 150/85 | HR 63 | Ht 65.0 in | Wt 163.8 lb

## 2012-06-29 DIAGNOSIS — E039 Hypothyroidism, unspecified: Secondary | ICD-10-CM

## 2012-06-29 DIAGNOSIS — I2510 Atherosclerotic heart disease of native coronary artery without angina pectoris: Secondary | ICD-10-CM

## 2012-06-29 DIAGNOSIS — I10 Essential (primary) hypertension: Secondary | ICD-10-CM

## 2012-06-29 DIAGNOSIS — E785 Hyperlipidemia, unspecified: Secondary | ICD-10-CM

## 2012-06-29 NOTE — Progress Notes (Signed)
HPI The patient presents for followup of his known coronary disease.  Since I last saw him he has done relatively well. He still has his stationary bicycle and still does not ride. He's had 2 episodes of chest discomfort in the past year for which he took 2 nitroglycerin a piece. He has not had any in several months. He is able to do activities such as lifting objects without bringing on any symptoms. He still works. He denies any shortness of breath, PND or orthopnea. He has had no palpitations, presyncope or syncope. Of note he did have a stress perfusion study last year which was negative for any evidence of ischemia or infarct. He had a normal ejection fraction.  No Known Allergies  Current Outpatient Prescriptions  Medication Sig Dispense Refill  . aspirin 81 MG tablet Take 81 mg by mouth daily.        Marland Kitchen atorvastatin (LIPITOR) 20 MG tablet TAKE ONE (1) TABLET EACH DAY  30 tablet  12  . cetirizine (ZYRTEC) 10 MG tablet Take 10 mg by mouth as needed.        . Cholecalciferol (VITAMIN D3) 1000 UNITS CAPS Take 1 capsule by mouth daily.        Marland Kitchen dutasteride (AVODART) 0.5 MG capsule Take 0.5 mg by mouth daily.        . isosorbide mononitrate (IMDUR) 120 MG 24 hr tablet TAKE ONE (1) TABLET EACH DAY  30 tablet  6  . levothyroxine (SYNTHROID, LEVOTHROID) 100 MCG tablet Take 100 mcg by mouth daily.        . metoprolol succinate (TOPROL-XL) 100 MG 24 hr tablet TAKE ONE TABLET DAILY  30 tablet  3  . nitroGLYCERIN (NITROSTAT) 0.4 MG SL tablet Place 1 tablet (0.4 mg total) under the tongue every 5 (five) minutes as needed.  15 tablet  0  . omeprazole (PRILOSEC) 20 MG capsule Take 20 mg by mouth daily.        . Tamsulosin HCl (FLOMAX) 0.4 MG CAPS Take 0.4 mg by mouth daily.          Past Medical History  Diagnosis Date  . Coronary artery disease     Cath June 2009, demonstrate left mild calcification, LAD had luminal irregularities, 60% stenosis in the distal portion, circumflex had a stent in the  AV groove which was patent. The RCA had a mid stent which was patent. EF 60%. THere was posterolateral branch with 70% stenosis that was narrow in caliber. The EF of 65%  . Hypothyroidism   . GI bleeding 2006  . Congenital blindness   . Hypertension   . Dyslipidemia   . Benign prostatic hypertrophy     Past Surgical History  Procedure Date  . Esophagogastroduodenoscopy   . Colonoscopy   . Hemorrhoid surgery     ROS: Blind.  Otherwise as stated in the HPI and negative for all other systems.  PHYSICAL EXAM BP 150/85  Pulse 63  Ht 5\' 5"  (1.651 m)  Wt 74.299 kg (163 lb 12.8 oz)  BMI 27.26 kg/m2 GENERAL:  Well appearing HEENT:  Pupils nonreactive, fundi not visualized, lens opacification, oral mucosa unremarkable NECK:  No jugular venous distention, waveform within normal limits, carotid upstroke brisk and symmetric, no bruits, no thyromegaly LYMPHATICS:  No cervical, inguinal adenopathy LUNGS:  Clear to auscultation bilaterally BACK:  No CVA tenderness CHEST:  Unremarkable HEART:  PMI not displaced or sustained,S1 and S2 within normal limits, no S3, no S4, no clicks, no  rubs, no murmurs ABD:  Flat, positive bowel sounds normal in frequency in pitch, no bruits, no rebound, no guarding, no midline pulsatile mass, no hepatomegaly, no splenomegaly EXT:  2 plus pulses throughout, no edema, no cyanosis no clubbing SKIN:  No rashes no nodules NEURO:  Cranial nerves II through XII grossly intact, motor grossly intact throughout PSYCH:  Cognitively intact, oriented to person place and time  EKG:  NSR, rate 63, leftward axis, possible RAE, no acute ST T wave changes.  06/29/2012  ASSESSMENT AND PLAN   CAD -  He's had only 2 episodes of chest discomfort in the last year since he had his stress test which was negative. At this point I don't think further cardiovascular testing is suggested. He will continue with risk reduction in left knee no if he has any increasing symptoms going  forward.  ESSENTIAL HYPERTENSION, BENIGN -  His blood pressure is elevated today.  However, he says it is good when taken elsewhere. I looked back at our previous readings and he's had only 2 systolic blood pressures above 150. Therefore, I won't make any changes. He will try to get somebody to check her.   HYPERLIPIDEMIA -  The patient will come back for a lipid with a goal LDL less than 100.

## 2012-06-29 NOTE — Patient Instructions (Addendum)
The current medical regimen is effective;  continue present plan and medications.  Please return for fasting blood work  Follow up in 1 year with Dr Antoine Poche.  You will receive a letter in the mail 2 months before you are due.  Please call us when you receive this letter to schedule your follow up appointment.

## 2012-07-01 ENCOUNTER — Other Ambulatory Visit (INDEPENDENT_AMBULATORY_CARE_PROVIDER_SITE_OTHER): Payer: Medicare Other

## 2012-07-01 DIAGNOSIS — E785 Hyperlipidemia, unspecified: Secondary | ICD-10-CM

## 2012-07-01 LAB — LIPID PANEL
Cholesterol: 120 mg/dL (ref 0–200)
HDL: 32.5 mg/dL — ABNORMAL LOW (ref >39.00)
Total CHOL/HDL Ratio: 4
Triglycerides: 75 mg/dL (ref 0.0–149.0)

## 2012-07-14 IMAGING — CR DG KNEE COMPLETE 4+V*L*
4 series · 4 of 4 positions shown · non-contrast
Comparison: No priors.

CLINICAL DATA: Left knee pain and swelling.  No history of injury.

LEFT KNEE - COMPLETE 4+ VIEW

[t knee ap left]
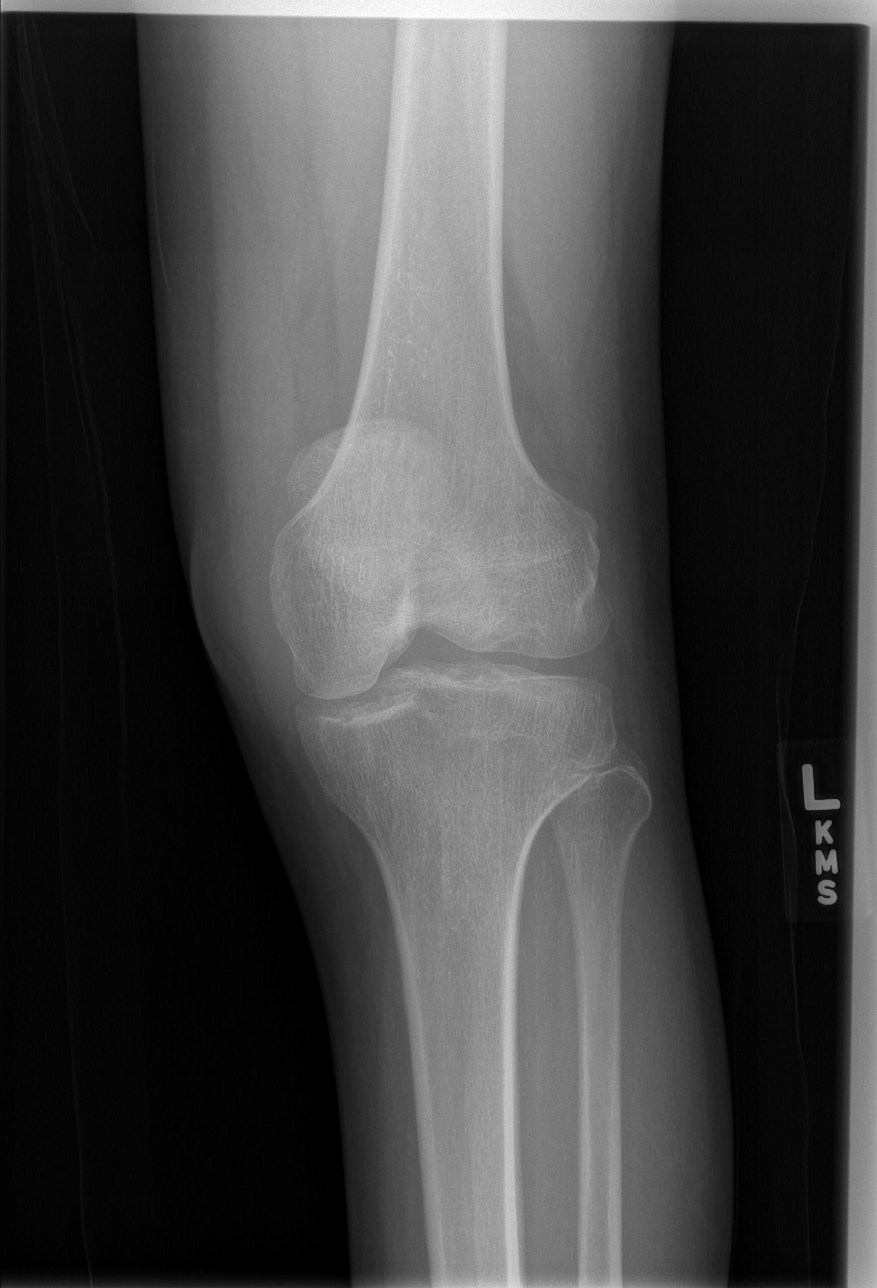

[t knee oblique left (1 of 2)]
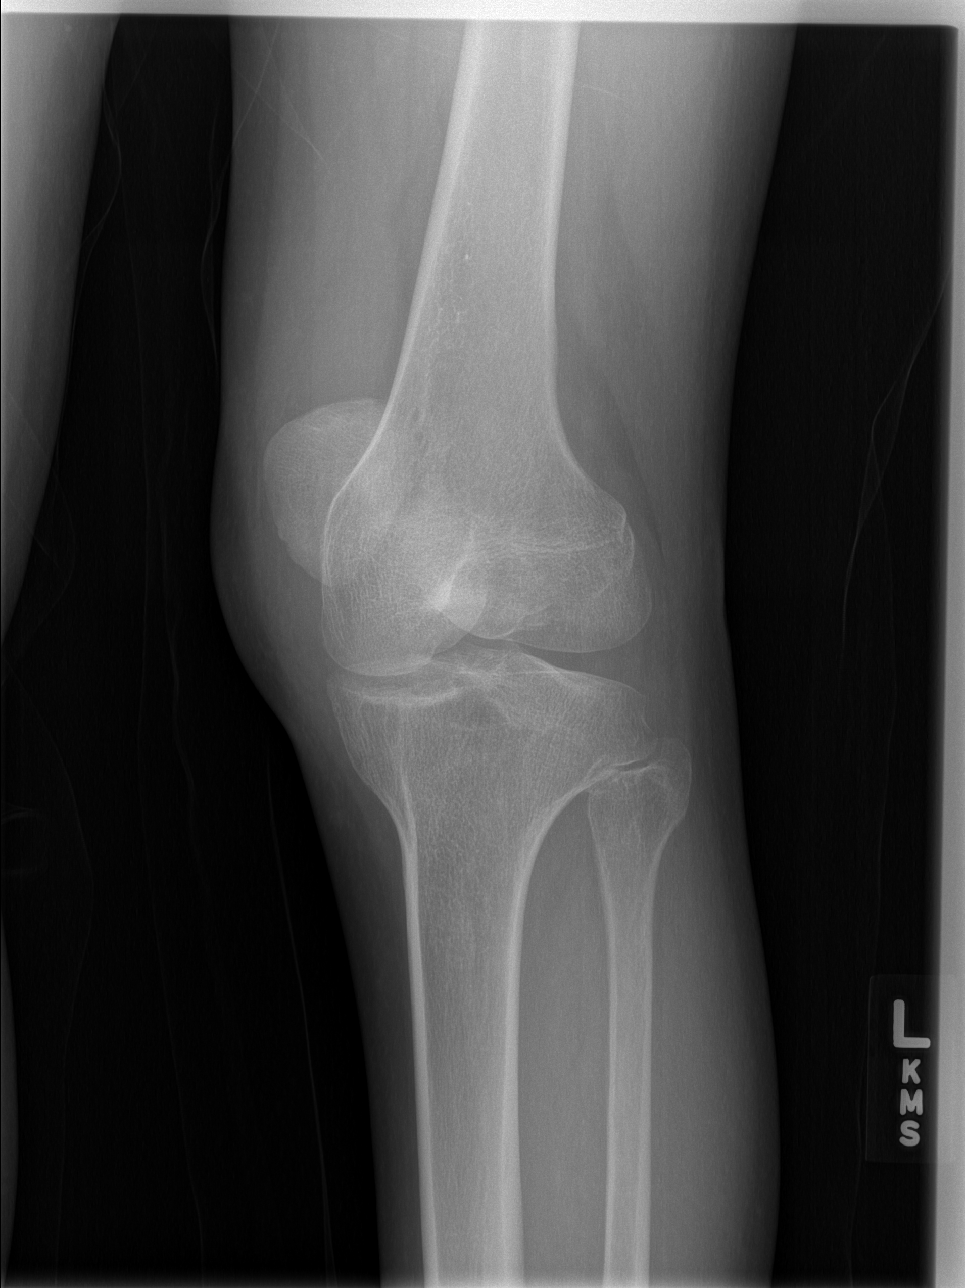

[t knee oblique left (2 of 2)]
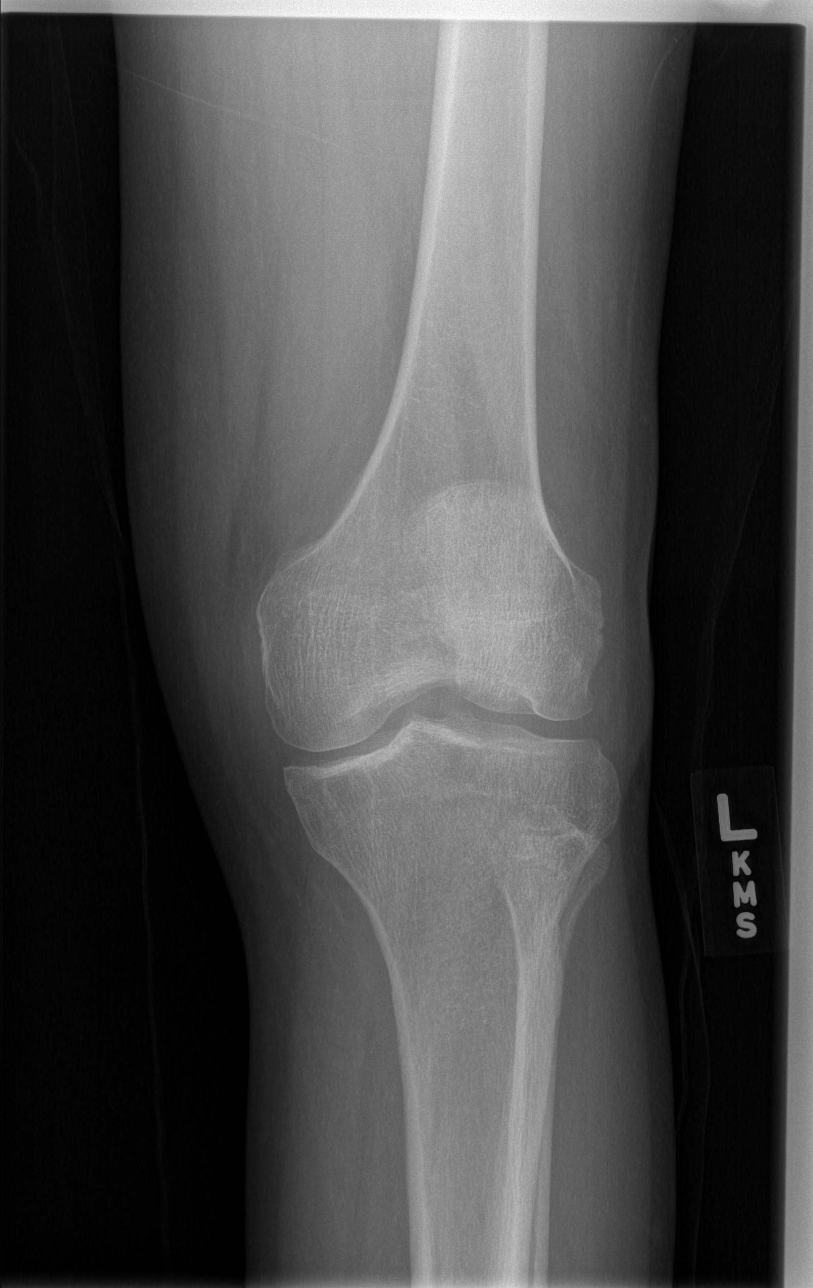

[t knee lat left]
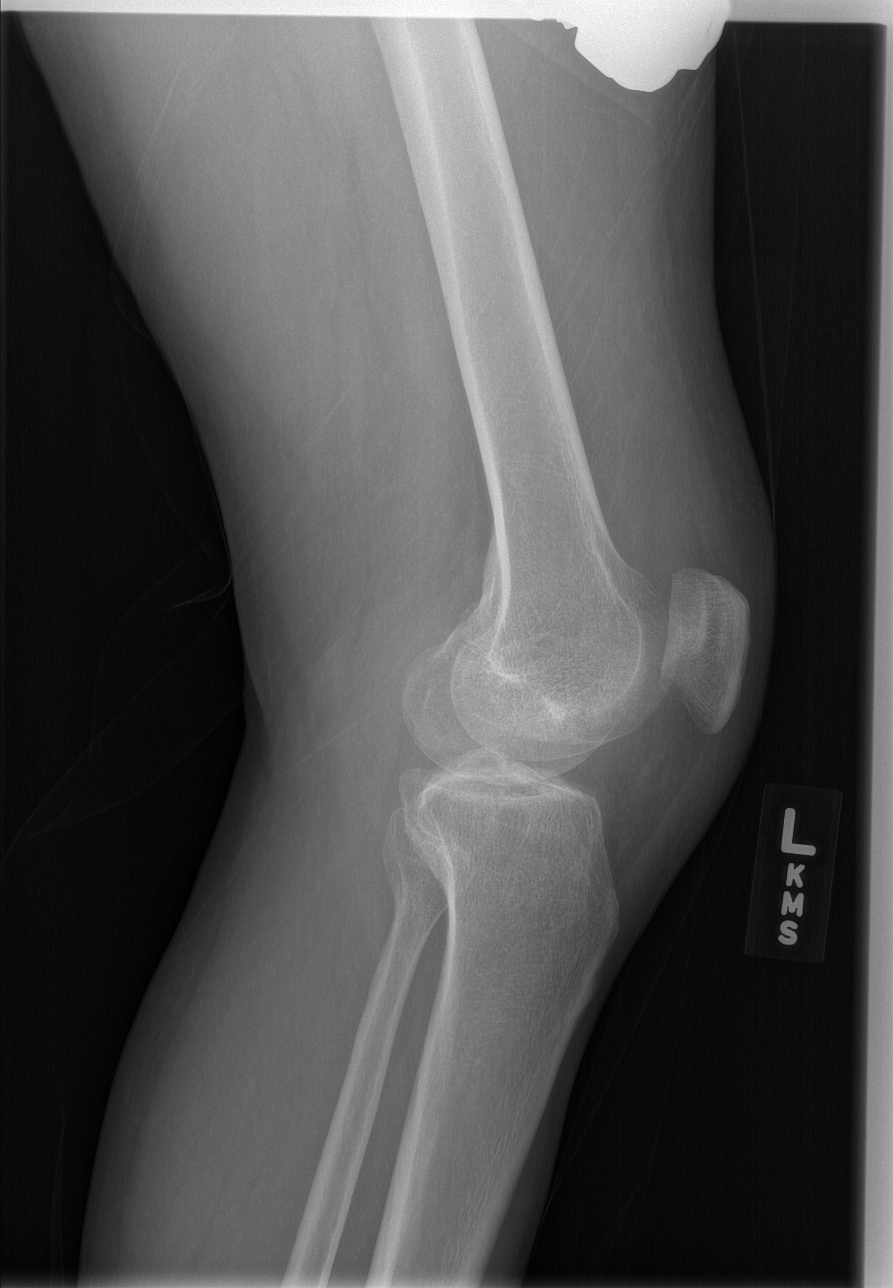

[4 of 4 positions shown; findings below may reference images not displayed]

FINDINGS: Multiple views of the left knee demonstrate no definite
acute fracture, subluxation or dislocation.  There is joint space
narrowing and subchondral sclerosis, most pronounced in the medial
compartment.  In addition, there is irregularity of the articular
surface of the lateral femoral condyle, which could suggest the
presence of an osteochondral defect.
IMPRESSION: 1.  Findings suspicious for potential osteochondral defect in the
articular surface of the lateral femoral condyle.  This could be
further evaluated with MRI if clinically indicated.
2. Mild degenerative changes of osteoarthritis, most pronounced in
the medial compartment.

## 2012-09-21 ENCOUNTER — Other Ambulatory Visit: Payer: Self-pay | Admitting: Cardiology

## 2012-10-01 ENCOUNTER — Other Ambulatory Visit: Payer: Self-pay | Admitting: *Deleted

## 2012-10-01 MED ORDER — ATORVASTATIN CALCIUM 20 MG PO TABS: 20.0000 mg | ORAL_TABLET | Freq: Every day | ORAL | Status: DC

## 2013-01-11 ENCOUNTER — Other Ambulatory Visit: Payer: Self-pay | Admitting: Cardiology

## 2013-01-12 ENCOUNTER — Other Ambulatory Visit: Payer: Self-pay | Admitting: *Deleted

## 2013-01-12 MED ORDER — NITROGLYCERIN 0.4 MG SL SUBL: 0.4000 mg | SUBLINGUAL_TABLET | SUBLINGUAL | Status: DC | PRN

## 2013-03-22 ENCOUNTER — Other Ambulatory Visit: Payer: Self-pay | Admitting: Cardiology

## 2013-04-28 ENCOUNTER — Encounter (HOSPITAL_COMMUNITY): Payer: Self-pay | Admitting: Unknown Physician Specialty

## 2013-04-28 ENCOUNTER — Emergency Department (HOSPITAL_COMMUNITY): Payer: Medicare Other

## 2013-04-28 ENCOUNTER — Inpatient Hospital Stay (HOSPITAL_COMMUNITY)
Admission: EM | Admit: 2013-04-28 | Discharge: 2013-05-01 | DRG: 287 | Disposition: A | Discharge status: Home / Self Care | Payer: Medicare Other | Attending: Internal Medicine | Admitting: Internal Medicine

## 2013-04-28 DIAGNOSIS — I10 Essential (primary) hypertension: Secondary | ICD-10-CM

## 2013-04-28 DIAGNOSIS — D72819 Decreased white blood cell count, unspecified: Secondary | ICD-10-CM | POA: Diagnosis present

## 2013-04-28 DIAGNOSIS — Z8249 Family history of ischemic heart disease and other diseases of the circulatory system: Secondary | ICD-10-CM

## 2013-04-28 DIAGNOSIS — H543 Unqualified visual loss, both eyes: Secondary | ICD-10-CM | POA: Diagnosis present

## 2013-04-28 DIAGNOSIS — N400 Enlarged prostate without lower urinary tract symptoms: Secondary | ICD-10-CM

## 2013-04-28 DIAGNOSIS — Z79899 Other long term (current) drug therapy: Secondary | ICD-10-CM

## 2013-04-28 DIAGNOSIS — H547 Unspecified visual loss: Secondary | ICD-10-CM

## 2013-04-28 DIAGNOSIS — I2510 Atherosclerotic heart disease of native coronary artery without angina pectoris: Principal | ICD-10-CM

## 2013-04-28 DIAGNOSIS — E039 Hypothyroidism, unspecified: Secondary | ICD-10-CM

## 2013-04-28 DIAGNOSIS — Z9861 Coronary angioplasty status: Secondary | ICD-10-CM

## 2013-04-28 DIAGNOSIS — E785 Hyperlipidemia, unspecified: Secondary | ICD-10-CM

## 2013-04-28 DIAGNOSIS — K219 Gastro-esophageal reflux disease without esophagitis: Secondary | ICD-10-CM

## 2013-04-28 DIAGNOSIS — R079 Chest pain, unspecified: Secondary | ICD-10-CM

## 2013-04-28 DIAGNOSIS — I252 Old myocardial infarction: Secondary | ICD-10-CM

## 2013-04-28 DIAGNOSIS — I200 Unstable angina: Secondary | ICD-10-CM

## 2013-04-28 DIAGNOSIS — Z7982 Long term (current) use of aspirin: Secondary | ICD-10-CM

## 2013-04-28 HISTORY — DX: Unspecified osteoarthritis, unspecified site: M19.90

## 2013-04-28 HISTORY — DX: Gastro-esophageal reflux disease without esophagitis: K21.9

## 2013-04-28 LAB — CBC WITH DIFFERENTIAL/PLATELET
Basophils Absolute: 0 10*3/uL (ref 0.0–0.1)
Basophils Relative: 0 % (ref 0–1)
Eosinophils Absolute: 0 10*3/uL (ref 0.0–0.7)
Eosinophils Relative: 1 % (ref 0–5)
HCT: 37.6 % — ABNORMAL LOW (ref 39.0–52.0)
MCH: 29.2 pg (ref 26.0–34.0)
MCHC: 32.4 g/dL (ref 30.0–36.0)
Monocytes Absolute: 0.3 10*3/uL (ref 0.1–1.0)
Neutro Abs: 2.3 10*3/uL (ref 1.7–7.7)
RDW: 13.3 % (ref 11.5–15.5)

## 2013-04-28 LAB — TROPONIN I: Troponin I: <0.3 ng/mL (ref <0.30)

## 2013-04-28 LAB — BASIC METABOLIC PANEL
BUN: 19 mg/dL (ref 6–23)
Calcium: 9.4 mg/dL (ref 8.4–10.5)
Chloride: 103 mEq/L (ref 96–112)
Creatinine, Ser: 1.04 mg/dL (ref 0.50–1.35)
GFR calc Af Amer: 83 mL/min — ABNORMAL LOW (ref >90)
GFR calc non Af Amer: 72 mL/min — ABNORMAL LOW (ref >90)

## 2013-04-28 MED ORDER — ISOSORBIDE MONONITRATE ER 60 MG PO TB24
120.0000 mg | ORAL_TABLET | Freq: Every day | ORAL | Status: DC
Start: 2013-04-28 — End: 2013-04-28
  Filled 2013-04-28: qty 2

## 2013-04-28 MED ORDER — ISOSORBIDE MONONITRATE ER 60 MG PO TB24
120.0000 mg | ORAL_TABLET | Freq: Every day | ORAL | Status: DC
  Administered 2013-04-29 – 2013-05-01 (×3): 120 mg via ORAL
  Filled 2013-04-28 (×3): qty 2

## 2013-04-28 MED ORDER — ISOSORBIDE MONONITRATE ER 60 MG PO TB24: 120.0000 mg | ORAL_TABLET | Freq: Every day | ORAL | Status: DC

## 2013-04-28 MED ORDER — NITROGLYCERIN 0.4 MG SL SUBL: 0.4000 mg | SUBLINGUAL_TABLET | SUBLINGUAL | Status: DC | PRN

## 2013-04-28 MED ORDER — HEPARIN SODIUM (PORCINE) 5000 UNIT/ML IJ SOLN
5000.0000 [IU] | Freq: Three times a day (TID) | INTRAMUSCULAR | Status: DC
  Administered 2013-04-28 – 2013-05-01 (×6): 5000 [IU] via SUBCUTANEOUS
  Filled 2013-04-28 (×11): qty 1

## 2013-04-28 MED ORDER — ASPIRIN EC 81 MG PO TBEC
81.0000 mg | DELAYED_RELEASE_TABLET | Freq: Every day | ORAL | Status: DC
  Filled 2013-04-28: qty 1

## 2013-04-28 MED ORDER — METOPROLOL SUCCINATE ER 100 MG PO TB24
100.0000 mg | ORAL_TABLET | Freq: Every day | ORAL | Status: DC
  Administered 2013-04-29 – 2013-05-01 (×3): 100 mg via ORAL
  Filled 2013-04-28 (×5): qty 1

## 2013-04-28 MED ORDER — LEVOTHYROXINE SODIUM 100 MCG PO TABS
100.0000 ug | ORAL_TABLET | Freq: Every day | ORAL | Status: DC
  Administered 2013-04-29 – 2013-05-01 (×3): 100 ug via ORAL
  Filled 2013-04-28 (×5): qty 1

## 2013-04-28 MED ORDER — LEVOTHYROXINE SODIUM 100 MCG PO TABS
100.0000 ug | ORAL_TABLET | Freq: Every day | ORAL | Status: DC
  Filled 2013-04-28: qty 1

## 2013-04-28 MED ORDER — TAMSULOSIN HCL 0.4 MG PO CAPS
0.4000 mg | ORAL_CAPSULE | Freq: Every day | ORAL | Status: DC
  Administered 2013-04-28 – 2013-05-01 (×4): 0.4 mg via ORAL
  Filled 2013-04-28 (×4): qty 1

## 2013-04-28 MED ORDER — PANTOPRAZOLE SODIUM 40 MG PO TBEC: 40.0000 mg | DELAYED_RELEASE_TABLET | Freq: Every day | ORAL | Status: DC

## 2013-04-28 MED ORDER — ACETAMINOPHEN 325 MG PO TABS: 650.0000 mg | ORAL_TABLET | ORAL | Status: DC | PRN

## 2013-04-28 MED ORDER — DUTASTERIDE 0.5 MG PO CAPS
0.5000 mg | ORAL_CAPSULE | Freq: Every day | ORAL | Status: DC
  Administered 2013-04-28 – 2013-05-01 (×4): 0.5 mg via ORAL
  Filled 2013-04-28 (×4): qty 1

## 2013-04-28 MED ORDER — ASPIRIN 81 MG PO TABS: 81.0000 mg | ORAL_TABLET | Freq: Every day | ORAL | Status: DC

## 2013-04-28 MED ORDER — ONDANSETRON HCL 4 MG/2ML IJ SOLN: 4.0000 mg | Freq: Four times a day (QID) | INTRAMUSCULAR | Status: DC | PRN

## 2013-04-28 MED ORDER — ATORVASTATIN CALCIUM 20 MG PO TABS
20.0000 mg | ORAL_TABLET | Freq: Every day | ORAL | Status: DC
Start: 2013-04-28 — End: 2013-05-01
  Administered 2013-04-28 – 2013-04-30 (×3): 20 mg via ORAL
  Filled 2013-04-28 (×4): qty 1

## 2013-04-28 MED ORDER — ASPIRIN EC 81 MG PO TBEC
81.0000 mg | DELAYED_RELEASE_TABLET | Freq: Every day | ORAL | Status: DC
  Administered 2013-04-29 – 2013-05-01 (×2): 81 mg via ORAL
  Filled 2013-04-28 (×3): qty 1

## 2013-04-28 MED ORDER — PANTOPRAZOLE SODIUM 40 MG PO TBEC
40.0000 mg | DELAYED_RELEASE_TABLET | Freq: Every day | ORAL | Status: DC
  Administered 2013-04-29 – 2013-05-01 (×3): 40 mg via ORAL
  Filled 2013-04-28 (×3): qty 1

## 2013-04-28 MED ORDER — GI COCKTAIL ~~LOC~~: 30.0000 mL | Freq: Four times a day (QID) | ORAL | Status: DC | PRN

## 2013-04-28 MED ORDER — MORPHINE SULFATE 2 MG/ML IJ SOLN: 1.0000 mg | INTRAMUSCULAR | Status: DC | PRN

## 2013-04-28 NOTE — ED Provider Notes (Signed)
CSN: [id number]     Arrival date & time 04/28/13  0805 History     First MD Initiated Contact with Patient 04/28/13 [phone]     Chief Complaint  Patient presents with  . Chest Pain   (Consider location/radiation/quality/duration/timing/severity/associated sxs/prior Treatment) HPI Comments: 67 yo male with CAD, HTN, lipids, non smoker, no hx of blood clots presents with anterior chest pressure this am around 730.  Pt had two episodes of pressure, mild radiation to left arm this am, no diaphoresis or nausea today.  No significant sob.  Sxs resolved on arrival.  Pt had stress test 2 years prior, he recalls normal.  Dr Alphonsa Gin cardiology [facility].  No recent exertional sxs.  No recent surgery or leg swelling.  ASA this am.  Patient is a 67 y.o. male presenting with chest pain. The history is provided by the patient.  Chest Pain Pain location:  Substernal area Associated symptoms: no abdominal pain, no back pain, no cough, no fever, no headache, no shortness of breath and not vomiting     Past Medical History  Diagnosis Date  . Coronary artery disease     Cath June 2009, demonstrate left mild calcification, LAD had luminal irregularities, 60% stenosis in the distal portion, circumflex had a stent in the AV groove which was patent. The RCA had a mid stent which was patent. EF 60%. THere was posterolateral branch with 70% stenosis that was narrow in caliber. The EF of 65%  . Hypothyroidism   . GI bleeding 2006  . Congenital blindness   . Hypertension   . Dyslipidemia   . Benign prostatic hypertrophy    Past Surgical History  Procedure Laterality Date  . Esophagogastroduodenoscopy    . Colonoscopy    . Hemorrhoid surgery     No family history on file. History  Substance Use Topics  . Smoking status: Never Smoker   . Smokeless tobacco: Not on file  . Alcohol Use: No    Review of Systems  Constitutional: Negative for fever and chills.  HENT: Negative for neck pain and neck  stiffness.   Eyes: Negative for visual disturbance.  Respiratory: Negative for cough and shortness of breath.   Cardiovascular: Positive for chest pain. Negative for leg swelling.  Gastrointestinal: Negative for vomiting and abdominal pain.  Genitourinary: Negative for dysuria and flank pain.  Musculoskeletal: Negative for back pain.  Skin: Negative for rash.  Neurological: Negative for light-headedness and headaches.    Allergies  Review of patient's allergies indicates no known allergies.  Home Medications   Current Outpatient Rx  Name  Route  Sig  Dispense  Refill  . aspirin 81 MG tablet   Oral   Take 81 mg by mouth daily.           Marland Kitchen atorvastatin (LIPITOR) 20 MG tablet   Oral   Take 1 tablet (20 mg total) by mouth daily.   30 tablet   12     PLEASE RESPOND THANKS!   Marland Kitchen cetirizine (ZYRTEC) 10 MG tablet   Oral   Take 10 mg by mouth as needed.           . Cholecalciferol (VITAMIN D3) 1000 UNITS CAPS   Oral   Take 1 capsule by mouth daily.           Marland Kitchen dutasteride (AVODART) 0.5 MG capsule   Oral   Take 0.5 mg by mouth daily.           . isosorbide  mononitrate (IMDUR) 120 MG 24 hr tablet      TAKE ONE (1) TABLET EACH DAY   30 tablet   5   . levothyroxine (SYNTHROID, LEVOTHROID) 100 MCG tablet   Oral   Take 100 mcg by mouth daily.           . metoprolol succinate (TOPROL-XL) 100 MG 24 hr tablet      TAKE ONE TABLET DAILY   30 tablet   5   . nitroGLYCERIN (NITROSTAT) 0.4 MG SL tablet   Sublingual   Place 1 tablet (0.4 mg total) under the tongue every 5 (five) minutes as needed.   25 tablet   5   . omeprazole (PRILOSEC) 20 MG capsule   Oral   Take 20 mg by mouth daily.           . Tamsulosin HCl (FLOMAX) 0.4 MG CAPS   Oral   Take 0.4 mg by mouth daily.            BP 160/86  Pulse 96  Temp(Src) 98 F (36.7 C) (Oral)  Resp 20  SpO2 100% Physical Exam  Nursing note and vitals reviewed. Constitutional: He is oriented to person,  place, and time. He appears well-developed and well-nourished.  HENT:  Head: Normocephalic and atraumatic.  Eyes: Conjunctivae are normal. Right eye exhibits no discharge. Left eye exhibits no discharge.  Neck: Normal range of motion. Neck supple. No tracheal deviation present.  Cardiovascular: Normal rate, regular rhythm and intact distal pulses.   No murmur heard. Pulmonary/Chest: Effort normal and breath sounds normal.  Abdominal: Soft. He exhibits no distension. There is no tenderness. There is no guarding.  Musculoskeletal: He exhibits no edema and no tenderness.  Neurological: He is alert and oriented to person, place, and time.  Skin: Skin is warm. No rash noted.  Psychiatric: He has a normal mood and affect.    ED Course   Procedures (including critical care time)  Labs Reviewed  CBC WITH DIFFERENTIAL - Abnormal; Notable for the following:    WBC 3.0 (*)    RBC 4.18 (*)    Hemoglobin 12.2 (*)    HCT 37.6 (*)    Platelets 113 (*)    Neutrophils Relative % 78 (*)    Lymphocytes Relative 10 (*)    Lymphs Abs 0.3 (*)    All other components within normal limits  BASIC METABOLIC PANEL - Abnormal; Notable for the following:    Glucose, Bld 129 (*)    GFR calc non Af Amer 72 (*)    GFR calc Af Amer 83 (*)    All other components within normal limits  TROPONIN I   No results found. No diagnosis found.  MDM  Known CAD and risk factors. Concern for worsening CAD with intermittent sxs. Cardiac eval.  Plan for observation on tele.  Date: 04/28/2013  Rate: 92  Rhythm: normal sinus rhythm  QRS Axis: left  Intervals: QT prolonged  ST/T Wave abnormalities: normal  Conduction Disutrbances:none  Narrative Interpretation:   Old EKG Reviewed: unchanged  Spoke with pt and hospitalist for tele observation, agreed for concern for CAD.      Enid Skeens, MD 04/28/13 [phone]

## 2013-04-28 NOTE — ED Notes (Signed)
Pt c/o mid sternal CP that is now resolved; pt sts some SOB

## 2013-04-28 NOTE — H&P (Signed)
[facility] History and Physical  Dustin Hess ZOX:[id number] DOB: [birth year] DOA: 04/28/2013  Referring physician: Blane Ohara, ER physician PCP: Laurena Slimmer, MD  Specialists: None  Chief Complaint: Chest pain  HPI: Dustin Hess is a 67 y.o. male  Past medical history of hypertension, hypothyroidism, congenital bleeding and CAD status post cath in 2009 with stent who presents to the emergency room chest pain. Patient in usual state of health and then this morning woke up with midsternal chest pain/pressure. Described as heavy and sharp both located midsternal and nonradiating. About a 5/10 in pain. No associated shortness of breath, dizziness or lightheadedness. No associated nausea vomiting. Patient took some nitroglycerin and aspirin and felt better. An hour later, symptoms came back. Patient in the emergency room. Initial set of cardiac enzymes and EKG were unremarkable. Lab work and vitals were all unremarkable. Given nitroglycerin glycerin again this time made the pain completely disappear. Given patient's previous history of felt best to bring him in overnight for observation. Hospitalists were called for admission.  Review of Systems: Patient seen after arrival to floor. Doing okay. Chest pain resolved. Denies any headaches, palpitations, shortness of breath, wheeze, cough, abdominal pain, nausea, vomiting, hematuria, dysuria, constipation, diarrhea, focal extremity numbness or weakness or pain. Review systems otherwise negative.  Past Medical History  Diagnosis Date  . Coronary artery disease     Cath June 2009, demonstrate left mild calcification, LAD had luminal irregularities, 60% stenosis in the distal portion, circumflex had a stent in the AV groove which was patent. The RCA had a mid stent which was patent. EF 60%. THere was posterolateral branch with 70% stenosis that was narrow in caliber. The EF of 65%  . Hypothyroidism   . GI bleeding 2006  .  Congenital blindness   . Hypertension   . Dyslipidemia   . Benign prostatic hypertrophy    Past Surgical History  Procedure Laterality Date  . Esophagogastroduodenoscopy    . Colonoscopy    . Hemorrhoid surgery     Social History:  reports that he has never smoked. He does not have any smokeless tobacco history on file. He reports that he does not drink alcohol or use illicit drugs. The patient was at home with his wife and daughter. He is able to precipitate some activities of daily living, limited by his blindness not by physical exertion  No Known Allergies  Family history: Hypertension  Prior to Admission medications   Medication Sig Start Date End Date Taking? Authorizing Provider  aspirin 81 MG tablet Take 81 mg by mouth daily.     Yes Historical Provider, MD  atorvastatin (LIPITOR) 20 MG tablet Take 1 tablet (20 mg total) by mouth daily. 10/01/12  Yes Rollene Rotunda, MD  Cholecalciferol (VITAMIN D3) 1000 UNITS CAPS Take 1 capsule by mouth daily.     Yes Historical Provider, MD  dutasteride (AVODART) 0.5 MG capsule Take 0.5 mg by mouth daily.     Yes Historical Provider, MD  isosorbide mononitrate (IMDUR) 120 MG 24 hr tablet Take 120 mg by mouth daily.   Yes Historical Provider, MD  levothyroxine (SYNTHROID, LEVOTHROID) 100 MCG tablet Take 100 mcg by mouth daily.     Yes Historical Provider, MD  metoprolol succinate (TOPROL-XL) 100 MG 24 hr tablet Take 100 mg by mouth daily. Take with or immediately following a meal.   Yes Historical Provider, MD  nitroGLYCERIN (NITROSTAT) 0.4 MG SL tablet Place 1 tablet (0.4 mg total) under the tongue every 5 (  five) minutes as needed. 01/12/13  Yes Rollene Rotunda, MD  omeprazole (PRILOSEC) 20 MG capsule Take 20 mg by mouth daily.     Yes Historical Provider, MD  Tamsulosin HCl (FLOMAX) 0.4 MG CAPS Take 0.4 mg by mouth daily.     Yes Historical Provider, MD   Physical Exam: Filed Vitals:   04/28/13 1200 04/28/13 1215 04/28/13 1230 04/28/13 1407   BP: 129/82 124/77 127/81 146/83  Pulse: 73 78 79 74  Temp:    98.2 F (36.8 C)  TempSrc:    Oral  Resp: 18 19 20 20   SpO2: 99% 99% 99% 98%     General:  Alert and RN x3, no acute distress  Eyes: Left eye proptotic, legally blind  ENT: Normocephalic, atraumatic, mucous members are slightly dry  Neck: Supple, no JVD  Cardiovascular: Regular rate and rhythm, S1-S2  Respiratory: Clear to auscultation bilaterally  Abdomen: Soft, nontender, nondistended, positive bowel sounds  Skin: No skin breaks, tears or lesions  Musculoskeletal: No clubbing or cyanosis or edema  Psychiatric: Patient is appropriate, no evidence of psychoses  Neurologic: No focal deficits  Labs on Admission:  Basic Metabolic Panel:  Recent Labs Lab 04/28/13 0852  NA 137  K 3.9  CL 103  CO2 26  GLUCOSE 129*  BUN 19  CREATININE 1.04  CALCIUM 9.4   CBC:  Recent Labs Lab 04/28/13 0852  WBC 3.0*  NEUTROABS 2.3  HGB 12.2*  HCT 37.6*  MCV 90.0  PLT 113*   Cardiac Enzymes:  Recent Labs Lab 04/28/13 0852  TROPONINI <0.30     Radiological Exams on Admission: Dg Chest 2 View  04/28/2013     IMPRESSION: No acute cardiopulmonary disease.   Original Report Authenticated By: Britta Mccreedy, M.D.    EKG: Independently reviewed. Normal sinus rhythm with left axis deviation, looks to be unchanged when compared to previous  Assessment/Plan Principal Problem:   Chest pain, unspecified Active Problems:   Blindness, congenital: Stable    GERD (gastroesophageal reflux disease): Continue PPI    BPH (benign prostatic hypertrophy): Continue Flomax  Hypertension: Stable. Continue home meds  Hyperlipidemia: Stable continue statin    Code Status: Full code  Family Communication: Left message w/ family  Disposition Plan: Home likely tomorrow  Time spent: 25 minutes  Hollice Espy [facility] Pager [phone]  If 7PM-7AM, please contact  night-coverage [email or web address] Password [email or web address] 04/28/2013, 7:11 PM

## 2013-04-28 NOTE — ED Notes (Signed)
Family at bedside. 

## 2013-04-28 NOTE — ED Notes (Signed)
Patient is resting comfortably. 

## 2013-04-28 NOTE — ED Notes (Signed)
Report given to Ceasar, RN unit 3W.

## 2013-04-29 ENCOUNTER — Encounter (HOSPITAL_COMMUNITY): Payer: Self-pay | Admitting: General Practice

## 2013-04-29 DIAGNOSIS — I2510 Atherosclerotic heart disease of native coronary artery without angina pectoris: Secondary | ICD-10-CM

## 2013-04-29 DIAGNOSIS — I200 Unstable angina: Secondary | ICD-10-CM

## 2013-04-29 DIAGNOSIS — H543 Unqualified visual loss, both eyes: Secondary | ICD-10-CM

## 2013-04-29 DIAGNOSIS — N400 Enlarged prostate without lower urinary tract symptoms: Secondary | ICD-10-CM

## 2013-04-29 MED ORDER — ASPIRIN 81 MG PO CHEW
324.0000 mg | CHEWABLE_TABLET | ORAL | Status: AC
  Administered 2013-04-30: 324 mg via ORAL
  Filled 2013-04-29: qty 4

## 2013-04-29 MED ORDER — SODIUM CHLORIDE 0.9 % IJ SOLN: 3.0000 mL | INTRAMUSCULAR | Status: DC | PRN

## 2013-04-29 MED ORDER — SODIUM CHLORIDE 0.9 % IV SOLN
INTRAVENOUS | Status: DC
  Administered 2013-04-30: 03:00:00 via INTRAVENOUS

## 2013-04-29 MED ORDER — SODIUM CHLORIDE 0.9 % IV SOLN: 250.0000 mL | INTRAVENOUS | Status: DC | PRN

## 2013-04-29 MED ORDER — SODIUM CHLORIDE 0.9 % IJ SOLN
3.0000 mL | Freq: Two times a day (BID) | INTRAMUSCULAR | Status: DC
  Administered 2013-04-29: 3 mL via INTRAVENOUS

## 2013-04-29 NOTE — Progress Notes (Signed)
Utilization review completed.  P.J. Ronesha Heenan,RN,BSN Case Manager [phone]  

## 2013-04-29 NOTE — Consult Note (Signed)
CARDIOLOGY CONSULT NOTE  Patient ID: KYRUS HYDE MRN: [id number], DOB/AGE: [birth year]   Admit date: 04/28/2013 Date of Consult: 04/29/2013  Primary Physician: Laurena Slimmer, MD Primary Cardiologist: Shela Commons. Hochrein, MD   Pt. Profile  67 y/o male with h/o CAD s/p inf MI and RCA/LCX stenting in 2005 who presented to the ED yesterday with recurrent nitrate responsive chest pain.  Problem List  Past Medical History  Diagnosis Date  . Coronary artery disease     a. 06/2004 Inf STEMI with PCI/BMS to RCA/LCX;  b. Cath June 2009, demonstrate left mild calcification, LAD had luminal irregularities, 60% stenosis in the distal portion, circumflex had a stent in the AV groove which was patent. The RCA had a mid stent which was patent. EF 60%. THere was posterolateral branch with 70% stenosis that was narrow in caliber. The EF of 65%;  c. 06/2011 Neg MV, EF 67%.  . Hypothyroidism   . GI bleeding 2006  . Congenital blindness   . Hypertension   . Dyslipidemia   . Benign prostatic hypertrophy   . GERD (gastroesophageal reflux disease)   . Arthritis     Past Surgical History  Procedure Laterality Date  . Esophagogastroduodenoscopy    . Colonoscopy    . Hemorrhoid surgery    . Coronary angioplasty with stent placement      Allergies  No Known Allergies  HPI   67 y/o male with h/o CAD s/p inferior MI in 2005.  At that time the RCA was stented and then the CTO of the LCX was stented in a staged fashion.  BMS' were used in each location.  He's done well over the years and works full-time @ [employer] for [employer].  He was in his USOH until yesterday morning.  He had just gotten up from eating breakfast and developed 5/10 left sided chest pressure w/o associated Ss.  He recognized this as angina and sat down and took a ntg.  Ss began to ease off immediately and so he got back up to get ready for work but Ss promptly worsened.  He sat back down and took another ntg and Ss again began to  ease off.  His son came over and decision was made to take him to the ED.  Upon arrival to the ED, he was more or less pain free.  Total duration of Ss was ~ 20 mins.  ECG was non-acute.  Since admission, CE have been negative.  He is pain free this AM.  Inpatient Medications  . aspirin EC  81 mg Oral Daily  . atorvastatin  20 mg Oral q1800  . dutasteride  0.5 mg Oral Daily  . heparin  5,000 Units Subcutaneous Q8H  . isosorbide mononitrate  120 mg Oral Daily  . levothyroxine  100 mcg Oral QAC breakfast  . metoprolol succinate  100 mg Oral Daily  . pantoprazole  40 mg Oral Daily  . tamsulosin  0.4 mg Oral Daily   Family History Family History  Problem Relation Age of Onset  . Cancer Mother     died @ 60  . Cancer Father     died @ 74  . Heart attack Cousin     died in her 21's.    Social History History   Social History  . Marital Status: Married    Spouse Name: N/A    Number of Children: N/A  . Years of Education: N/A   Occupational History  . Not on file.  Social History Main Topics  . Smoking status: Never Smoker   . Smokeless tobacco: Never Used  . Alcohol Use: No  . Drug Use: No  . Sexually Active: Not on file   Other Topics Concern  . Not on file   Social History Narrative   Lives in [place] with your wife and dtr.  Works on [employer] for the [employer].  Does not routinely exercise.    Review of Systems  General:  No chills, fever, night sweats or weight changes.  Blind. Cardiovascular:  +++ chest pain, no dyspnea on exertion, edema, orthopnea, palpitations, paroxysmal nocturnal dyspnea. Dermatological: No rash, lesions/masses Respiratory: No cough, dyspnea Urologic: No hematuria, dysuria Abdominal:   No nausea, vomiting, diarrhea, bright red blood per rectum, melena, or hematemesis Neurologic:  No visual changes, wkns, changes in mental status. MSK:  Occasional calf cramping while @ rest. All other systems reviewed and are otherwise negative  except as noted above.  Physical Exam  Blood pressure 153/79, pulse 80, temperature 98.4 F (36.9 C), temperature source Oral, resp. rate 18, SpO2 99.00%.  General: Pleasant, NAD Psych: Normal affect. Neuro: Alert and oriented X 3. Moves all extremities spontaneously. HEENT: Normal with exception of blindness. Neck: Supple without bruits or JVD. Lungs:  Resp regular and unlabored, CTA. Heart: RRR no s3, s4, or murmurs. Abdomen: Soft, non-tender, non-distended, BS + x 4.  Extremities: No clubbing, cyanosis or edema. DP/PT/Radials 2+ and equal bilaterally.  Labs   Recent Labs  04/28/13 0852 04/28/13 2227  TROPONINI <0.30 <0.30   Lab Results  Component Value Date   WBC 3.0* 04/28/2013   HGB 12.2* 04/28/2013   HCT 37.6* 04/28/2013   MCV 90.0 04/28/2013   PLT 113* 04/28/2013     Recent Labs Lab 04/28/13 0852  NA 137  K 3.9  CL 103  CO2 26  BUN 19  CREATININE 1.04  CALCIUM 9.4  GLUCOSE 129*   Lab Results  Component Value Date   CHOL 120 07/01/2012   HDL 32.50* 07/01/2012   LDLCALC 73 07/01/2012   TRIG 75.0 07/01/2012   Radiology/Studies  Dg Chest 2 View  04/28/2013   *RADIOLOGY REPORT*  Clinical Data: Chest pain and tightness  CHEST - 2 VIEW  Comparison: 12/28/2007  Findings: Normal heart, mediastinal, and hilar contours.  There is atherosclerotic calcification of the thoracic aortic arch. Pulmonary vascularity is upper normal.  The lungs are normally expanded and clear.  No airspace disease, effusion, or edema. Negative for pneumothorax.  No acute osseous abnormality identified.  Visualized upper abdomen is unremarkable  IMPRESSION: No acute cardiopulmonary disease.   Original Report Authenticated By: Britta Mccreedy, M.D.   ECG  Rsr, 92, LAD, no acute st/t changes.  ASSESSMENT AND PLAN  1.  USA/CAD:  Pt is s/p MI in 2005 with bare metal stents to the RCA and LCX at that time.  Cath in 2009 showed stable anatomy and MV in 06/2011 was non-ischemic.  He had nitrate  responsive chest pain yesterday that lasted about 20 mins.  He has no objective evidence of ischemia since admission.  Ss obviously concerning for ischemia.  Cont asa, statin, bb, nitrate.  Will plan cath tomorrow AM to re-evaluate coronary anatomy.  2.  HTN:  BP elevated this AM.  Follow.  May need additional agent.  3.  HL:  LDL 73 in 06/2012.  Cont statin.  Check LFT's.  4.  Leukopenia:  Follow.  Signed, Nicolasa Ducking, NP 04/29/2013, 9:23 AM Patient  seen and examined. I agree with the assessment and plan as detailed above. See also my additional thoughts below.   I have reviewed all the information in the note above and agree. I have discussed the case with Mr. Brion Aliment. I reviewed the symptoms with the patient. This gentleman is very active. He is blind. He does work everyday at [employer] for the blind. He has not been having significant symptoms until yesterday. He is now admitted with rest pain. He had this pain. He took nitroglycerin with relief. However the shortly thereafter the pain returned requiring an additional nitroglycerin. Since this is a new return of symptom and because it occurred at rest we need to proceed with more aggressive evaluation while he is in the hospital. I explained this to the patient and he agrees. We are making plans for catheterization.  Willa Rough, MD, [facility] 04/29/2013 10:59 AM

## 2013-04-29 NOTE — Progress Notes (Signed)
Patient ID: Dustin Hess  male  ZOX:[id number]    DOB: [birth year]    DOA: 04/28/2013  PCP: Laurena Slimmer, MD  Assessment/Plan: Principal Problem:   Chest pain, unspecified: risk factors include history of CAD status post MI and cardiac stent in 2005, hypertension, hyperlipidemia -  chest pain resolved, troponins negative cardiology was consulted (patient of Dr. Antoine Poche ), plan cardiac cath tomorrow  - Continue aspirin, Imdur, Lipitor, Toprol-XL  Active Problems:   HYPOTHYROIDISM: Continue Synthroid    HYPERLIPIDEMIA - Continue statins    Essential hypertension, benign- stable    CAD: As #1    Blindness, congenital    GERD (gastroesophageal reflux disease): Continue PPI    BPH (benign prostatic hypertrophy): Continue Flomax   DVT Prophylaxis:  Code Status:  Disposition:Cardiac cath tomorrow    Subjective: Chest pain resolved, no nausea, vomiting, fever, chills   Objective: Weight change:   Intake/Output Summary (Last 24 hours) at 04/29/13 1409 Last data filed at 04/28/13 1834  Gross per 24 hour  Intake    480 ml  Output      0 ml  Net    480 ml   Blood pressure 106/64, pulse 77, temperature 98.2 F (36.8 C), temperature source Oral, resp. rate 18, SpO2 100.00%.  Physical Exam: General: Alert and awake, oriented x3, not in any acute distress, blindness . CVS: S1-S2 clear, no murmur rubs or gallops Chest: clear to auscultation bilaterally, no wheezing, rales or rhonchi Abdomen: soft nontender, nondistended, normal bowel sounds  Extremities: no cyanosis, clubbing or edema noted bilaterally Neuro: Cranial nerves II-XII intact, no focal neurological deficits  Lab Results: Basic Metabolic Panel:  Recent Labs Lab 04/28/13 0852  NA 137  K 3.9  CL 103  CO2 26  GLUCOSE 129*  BUN 19  CREATININE 1.04  CALCIUM 9.4   CBC:  Recent Labs Lab 04/28/13 0852  WBC 3.0*  NEUTROABS 2.3  HGB 12.2*  HCT 37.6*  MCV 90.0  PLT 113*   Cardiac  Enzymes:  Recent Labs Lab 04/28/13 0852 04/28/13 2227 04/29/13 1202  TROPONINI <0.30 <0.30 <0.30   BNP: No components found with this basename: POCBNP,  CBG: No results found for this basename: GLUCAP,  in the last 168 hours   Micro Results: No results found for this or any previous visit (from the past 240 hour(s)).  Studies/Results: Dg Chest 2 View  04/28/2013   *RADIOLOGY REPORT*  Clinical Data: Chest pain and tightness  CHEST - 2 VIEW  Comparison: 12/28/2007  Findings: Normal heart, mediastinal, and hilar contours.  There is atherosclerotic calcification of the thoracic aortic arch. Pulmonary vascularity is upper normal.  The lungs are normally expanded and clear.  No airspace disease, effusion, or edema. Negative for pneumothorax.  No acute osseous abnormality identified.  Visualized upper abdomen is unremarkable  IMPRESSION: No acute cardiopulmonary disease.   Original Report Authenticated By: Britta Mccreedy, M.D.    Medications: Scheduled Meds: . [START ON 04/30/2013] aspirin  324 mg Oral Pre-Cath  . aspirin EC  81 mg Oral Daily  . atorvastatin  20 mg Oral q1800  . dutasteride  0.5 mg Oral Daily  . heparin  5,000 Units Subcutaneous Q8H  . isosorbide mononitrate  120 mg Oral Daily  . levothyroxine  100 mcg Oral QAC breakfast  . metoprolol succinate  100 mg Oral Daily  . pantoprazole  40 mg Oral Daily  . sodium chloride  3 mL Intravenous Q12H  . tamsulosin  0.4 mg Oral Daily  LOS: 1 day   Jahir Halt M.D. [facility] 04/29/2013, 2:09 PM Pager: [phone]  If 7PM-7AM, please contact night-coverage [email or web address] Password [email or web address]

## 2013-04-30 ENCOUNTER — Other Ambulatory Visit: Payer: Self-pay

## 2013-04-30 ENCOUNTER — Encounter (HOSPITAL_COMMUNITY)
Admission: EM | Disposition: A | Discharge status: Home / Self Care | Payer: Self-pay | Source: Home / Self Care | Attending: Internal Medicine

## 2013-04-30 DIAGNOSIS — I2510 Atherosclerotic heart disease of native coronary artery without angina pectoris: Secondary | ICD-10-CM

## 2013-04-30 HISTORY — PX: LEFT HEART CATHETERIZATION WITH CORONARY ANGIOGRAM: SHX5451

## 2013-04-30 LAB — COMPREHENSIVE METABOLIC PANEL
CO2: 25 mEq/L (ref 19–32)
Calcium: 9 mg/dL (ref 8.4–10.5)
Creatinine, Ser: 1.03 mg/dL (ref 0.50–1.35)
GFR calc Af Amer: 84 mL/min — ABNORMAL LOW (ref >90)
GFR calc non Af Amer: 73 mL/min — ABNORMAL LOW (ref >90)
Glucose, Bld: 96 mg/dL (ref 70–99)

## 2013-04-30 LAB — CBC
Hemoglobin: 13.2 g/dL (ref 13.0–17.0)
MCH: 29.7 pg (ref 26.0–34.0)
MCV: 89.9 fL (ref 78.0–100.0)
RBC: 4.45 MIL/uL (ref 4.22–5.81)

## 2013-04-30 LAB — PROTIME-INR: INR: 1.02 (ref 0.00–1.49)

## 2013-04-30 SURGERY — LEFT HEART CATHETERIZATION WITH CORONARY ANGIOGRAM
Anesthesia: LOCAL

## 2013-04-30 MED ORDER — SODIUM CHLORIDE 0.9 % IV SOLN: 250.0000 mL | INTRAVENOUS | Status: DC | PRN

## 2013-04-30 MED ORDER — HYDRALAZINE HCL 20 MG/ML IJ SOLN: 10.0000 mg | Freq: Four times a day (QID) | INTRAMUSCULAR | Status: DC | PRN

## 2013-04-30 MED ORDER — SODIUM CHLORIDE 0.9 % IJ SOLN: 3.0000 mL | INTRAMUSCULAR | Status: DC | PRN

## 2013-04-30 MED ORDER — HEPARIN (PORCINE) IN NACL 2-0.9 UNIT/ML-% IJ SOLN
INTRAMUSCULAR | Status: AC
  Filled 2013-04-30: qty 1000

## 2013-04-30 MED ORDER — VERAPAMIL HCL 2.5 MG/ML IV SOLN
INTRAVENOUS | Status: AC
  Filled 2013-04-30: qty 2

## 2013-04-30 MED ORDER — NITROGLYCERIN 0.2 MG/ML ON CALL CATH LAB
INTRAVENOUS | Status: AC
  Filled 2013-04-30: qty 1

## 2013-04-30 MED ORDER — SODIUM CHLORIDE 0.9 % IJ SOLN: 3.0000 mL | Freq: Two times a day (BID) | INTRAMUSCULAR | Status: DC

## 2013-04-30 MED ORDER — LABETALOL HCL 5 MG/ML IV SOLN: 10.0000 mg | INTRAVENOUS | Status: DC | PRN

## 2013-04-30 MED ORDER — HEPARIN SODIUM (PORCINE) 1000 UNIT/ML IJ SOLN
INTRAMUSCULAR | Status: AC
  Filled 2013-04-30: qty 1

## 2013-04-30 MED ORDER — MIDAZOLAM HCL 2 MG/2ML IJ SOLN
INTRAMUSCULAR | Status: AC
  Filled 2013-04-30: qty 2

## 2013-04-30 MED ORDER — FENTANYL CITRATE 0.05 MG/ML IJ SOLN
INTRAMUSCULAR | Status: AC
  Filled 2013-04-30: qty 2

## 2013-04-30 MED ORDER — LIDOCAINE HCL (PF) 1 % IJ SOLN
INTRAMUSCULAR | Status: AC
  Filled 2013-04-30: qty 30

## 2013-04-30 MED ORDER — SODIUM CHLORIDE 0.9 % IV SOLN: 1.0000 mL/kg/h | INTRAVENOUS | Status: AC

## 2013-04-30 NOTE — H&P (View-Only) (Signed)
 CARDIOLOGY CONSULT NOTE  Patient ID: Dustin Hess MRN: [id number], DOB/AGE: 11/28/1945   Admit date: 04/28/2013 Date of Consult: 04/29/2013  Primary Physician: CLARK,PRESTON S, MD Primary Cardiologist: J. Hochrein, MD   Pt. Profile  67 y/o male with h/o CAD s/p inf MI and RCA/LCX stenting in 2005 who presented to the ED yesterday with recurrent nitrate responsive chest pain.  Problem List  Past Medical History  Diagnosis Date  . Coronary artery disease     a. 06/2004 Inf STEMI with PCI/BMS to RCA/LCX;  b. Cath June 2009, demonstrate left mild calcification, LAD had luminal irregularities, 60% stenosis in the distal portion, circumflex had a stent in the AV groove which was patent. The RCA had a mid stent which was patent. EF 60%. THere was posterolateral branch with 70% stenosis that was narrow in caliber. The EF of 65%;  c. 06/2011 Neg MV, EF 67%.  . Hypothyroidism   . GI bleeding 2006  . Congenital blindness   . Hypertension   . Dyslipidemia   . Benign prostatic hypertrophy   . GERD (gastroesophageal reflux disease)   . Arthritis     Past Surgical History  Procedure Laterality Date  . Esophagogastroduodenoscopy    . Colonoscopy    . Hemorrhoid surgery    . Coronary angioplasty with stent placement      Allergies  No Known Allergies  HPI   67 y/o male with h/o CAD s/p inferior MI in 2005.  At that time the RCA was stented and then the CTO of the LCX was stented in a staged fashion.  BMS' were used in each location.  He's done well over the years and works full-time @ [employer].  He was in his USOH until yesterday morning.  He had just gotten up from eating breakfast and developed 5/10 left sided chest pressure w/o associated Ss.  He recognized this as angina and sat down and took a ntg.  Ss began to ease off immediately and so he got back up to get ready for work but Ss promptly worsened.  He sat back down and took another ntg and Ss again began to  ease off.  His son came over and decision was made to take him to the ED.  Upon arrival to the ED, he was more or less pain free.  Total duration of Ss was ~ 20 mins.  ECG was non-acute.  Since admission, CE have been negative.  He is pain free this AM.  Inpatient Medications  . aspirin EC  81 mg Oral Daily  . atorvastatin  20 mg Oral q1800  . dutasteride  0.5 mg Oral Daily  . heparin  5,000 Units Subcutaneous Q8H  . isosorbide mononitrate  120 mg Oral Daily  . levothyroxine  100 mcg Oral QAC breakfast  . metoprolol succinate  100 mg Oral Daily  . pantoprazole  40 mg Oral Daily  . tamsulosin  0.4 mg Oral Daily   Family History Family History  Problem Relation Age of Onset  . Cancer Mother     died @ 76  . Cancer Father     died @ 83  . Heart attack Cousin     died in her 40's.    Social History History   Social History  . Marital Status: Married    Spouse Name: N/A    Number of Children: N/A  . Years of Education: N/A   Occupational History  . Not on file.     Social History Main Topics  . Smoking status: Never Smoker   . Smokeless tobacco: Never Used  . Alcohol Use: No  . Drug Use: No  . Sexually Active: Not on file   Other Topics Concern  . Not on file   Social History Narrative   Lives in [place] with your wife and dtr.  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  Does not routinely exercise.    Review of Systems  General:  No chills, fever, night sweats or weight changes.  Blind. Cardiovascular:  +++ chest pain, no dyspnea on exertion, edema, orthopnea, palpitations, paroxysmal nocturnal dyspnea. Dermatological: No rash, lesions/masses Respiratory: No cough, dyspnea Urologic: No hematuria, dysuria Abdominal:   No nausea, vomiting, diarrhea, bright red blood per rectum, melena, or hematemesis Neurologic:  No visual changes, wkns, changes in mental status. MSK:  Occasional calf cramping while @ rest. All other systems reviewed and are otherwise negative  except as noted above.  Physical Exam  Blood pressure 153/79, pulse 80, temperature 98.4 F (36.9 C), temperature source Oral, resp. rate 18, SpO2 99.00%.  General: Pleasant, NAD Psych: Normal affect. Neuro: Alert and oriented X 3. Moves all extremities spontaneously. HEENT: Normal with exception of blindness. Neck: Supple without bruits or JVD. Lungs:  Resp regular and unlabored, CTA. Heart: RRR no s3, s4, or murmurs. Abdomen: Soft, non-tender, non-distended, BS + x 4.  Extremities: No clubbing, cyanosis or edema. DP/PT/Radials 2+ and equal bilaterally.  Labs   Recent Labs  04/28/13 0852 04/28/13 2227  TROPONINI <0.30 <0.30   Lab Results  Component Value Date   WBC 3.0* 04/28/2013   HGB 12.2* 04/28/2013   HCT 37.6* 04/28/2013   MCV 90.0 04/28/2013   PLT 113* 04/28/2013     Recent Labs Lab 04/28/13 0852  NA 137  K 3.9  CL 103  CO2 26  BUN 19  CREATININE 1.04  CALCIUM 9.4  GLUCOSE 129*   Lab Results  Component Value Date   CHOL 120 07/01/2012   HDL 32.50* 07/01/2012   LDLCALC 73 07/01/2012   TRIG 75.0 07/01/2012   Radiology/Studies  Dg Chest 2 View  04/28/2013   *RADIOLOGY REPORT*  Clinical Data: Chest pain and tightness  CHEST - 2 VIEW  Comparison: 12/28/2007  Findings: Normal heart, mediastinal, and hilar contours.  There is atherosclerotic calcification of the thoracic aortic arch. Pulmonary vascularity is upper normal.  The lungs are normally expanded and clear.  No airspace disease, effusion, or edema. Negative for pneumothorax.  No acute osseous abnormality identified.  Visualized upper abdomen is unremarkable  IMPRESSION: No acute cardiopulmonary disease.   Original Report Authenticated By: Susan Turner, M.D.   ECG  Rsr, 92, LAD, no acute st/t changes.  ASSESSMENT AND PLAN  1.  USA/CAD:  Pt is s/p MI in 2005 with bare metal stents to the RCA and LCX at that time.  Cath in 2009 showed stable anatomy and MV in 06/2011 was non-ischemic.  He had nitrate  responsive chest pain yesterday that lasted about 20 mins.  He has no objective evidence of ischemia since admission.  Ss obviously concerning for ischemia.  Cont asa, statin, bb, nitrate.  Will plan cath tomorrow AM to re-evaluate coronary anatomy.  2.  HTN:  BP elevated this AM.  Follow.  May need additional agent.  3.  HL:  LDL 73 in 06/2012.  Cont statin.  Check LFT's.  4.  Leukopenia:  Follow.  Signed, Christopher Berge, NP 04/29/2013, 9:23 AM Patient   seen and examined. I agree with the assessment and plan as detailed above. See also my additional thoughts below.   I have reviewed all the information in the note above and agree. I have discussed the case with Mr. Berge. I reviewed the symptoms with the patient. This gentleman is very active. He is blind. 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 He has not been having significant symptoms until yesterday. He is now admitted with rest pain. He had this pain. He took nitroglycerin with relief. However the shortly thereafter the pain returned requiring an additional nitroglycerin. Since this is a new return of symptom and because it occurred at rest we need to proceed with more aggressive evaluation while he is in the hospital. I explained this to the patient and he agrees. We are making plans for catheterization.  Jeffrey Katz, MD, FACC 04/29/2013 10:59 AM   

## 2013-04-30 NOTE — CV Procedure (Signed)
   Cardiac Catheterization Procedure Note  Name: KOLBIE LEPKOWSKI MRN: [id number] DOB: 03-17-1946  Procedure: Left Heart Cath, Selective Coronary Angiography, LV angiography  Indication: Chest pain concerning for USAP, nitrate responsive. Negative enzymes.   Procedural Details: The right wrist was prepped, draped, and anesthetized with 1% lidocaine. Using the modified Seldinger technique, a 5 French sheath was introduced into the right radial artery. 3 mg of verapamil was administered through the sheath, weight-based unfractionated heparin was administered intravenously. Standard Judkins catheters were used for selective coronary angiography and left ventriculography. The JR 4 could not engage the RCA so an AR-1 was used. Catheter exchanges were performed over an exchange length guidewire. There were no immediate procedural complications. A TR band was used for radial hemostasis at the completion of the procedure.  The patient was transferred to the post catheterization recovery area for further monitoring.  Procedural Findings: Hemodynamics: AO 149/83 LV 143/18  Coronary angiography: Coronary dominance: right  Left mainstem: Widely patent, arises from left cusp, divides into LAD, LCx, and ramus branch  Left anterior descending (LAD): Widely patent throughout with no obstructive disease. Reaches and wraps the LV apex.  Left circumflex (LCx): Intermediate with 40-50% stenosis, large vessel. AV circumflex with widely patent distal stent.  Right coronary artery (RCA): Dominant vessel. Mid 50%, distal 60% within stent, PDA diffusely diseased. Overall appearance grossly unchanged from last study in 2009.  Left ventriculography: Left ventricular systolic function is hyperdynamic, LVEF is estimated at 70-75%%, there is no significant mitral regurgitation   Final Conclusions:   1. Widely patent LCx stent 2. Patent LAD without significant disease 3. Moderate RCA stenosis stable since  2009 cath study 4. Hyperdynamic LV function  Recommendations: med Rx for CAD. Discharge tomorrow if no other issues.  Tonny Bollman 04/30/2013, 6:38 PM

## 2013-04-30 NOTE — Interval H&P Note (Signed)
History and Physical Interval Note:  04/30/2013 5:28 PM  Dustin Hess  has presented today for surgery, with the diagnosis of cp  The various methods of treatment have been discussed with the patient and family. After consideration of risks, benefits and other options for treatment, the patient has consented to  Procedure(s): LEFT HEART CATHETERIZATION WITH CORONARY ANGIOGRAM (N/A) as a surgical intervention .  The patient's history has been reviewed, patient examined, no change in status, stable for surgery.  I have reviewed the patient's chart and labs.  Questions were answered to the patient's satisfaction.    Cath Lab Visit (complete for each Cath Lab visit)  Clinical Evaluation Leading to the Procedure:   ACS: yes  Non-ACS:    Anginal Classification: CCS IV  Anti-ischemic medical therapy: Maximal Therapy (2 or more classes of medications)  Non-Invasive Test Results: No non-invasive testing performed  Prior CABG: No previous CABG         Tonny Bollman

## 2013-04-30 NOTE — Progress Notes (Signed)
Patient ID: Dustin Hess  male  QMV:[id number]    DOB: June 13, 1946    DOA: 04/28/2013  PCP: Laurena Slimmer, MD  Assessment/Plan: Principal Problem:   Chest pain, unspecified: risk factors include history of CAD status post MI and cardiac stent in 2005, hypertension, hyperlipidemia -  chest pain resolved, troponins negative, cardiac cath today  - Continue aspirin, Imdur, Lipitor, Toprol-XL  Active Problems:   HYPOTHYROIDISM: Continue Synthroid    HYPERLIPIDEMIA - Continue statins    Essential hypertension, benign- stable    CAD: As #1    Blindness, congenital    GERD (gastroesophageal reflux disease): Continue PPI    BPH (benign prostatic hypertrophy): Continue Flomax   DVT Prophylaxis:  Code Status:  Disposition:Cardiac cath today    Subjective: Chest pain resolved, no nausea, vomiting, fever, chills   Objective: Weight change:   Intake/Output Summary (Last 24 hours) at 04/30/13 1022 Last data filed at 04/29/13 2114  Gross per 24 hour  Intake    243 ml  Output      0 ml  Net    243 ml   Blood pressure 168/88, pulse 69, temperature 97.6 F (36.4 C), temperature source Oral, resp. rate 16, height 5\' 4"  (1.626 m), weight 76.7 kg (169 lb 1.5 oz), SpO2 99.00%.  Physical Exam: General: Alert and awake, oriented x3, not in any acute distress, blindness . CVS: S1-S2 clear, no murmur rubs or gallops Chest: CTAB Abdomen: soft NT, ND, NBS  Extremities: no cyanosis, clubbing or edema noted bilaterally   Lab Results: Basic Metabolic Panel:  Recent Labs Lab 04/28/13 0852 04/30/13 0557  NA 137 141  K 3.9 4.3  CL 103 108  CO2 26 25  GLUCOSE 129* 96  BUN 19 17  CREATININE 1.04 1.03  CALCIUM 9.4 9.0   CBC:  Recent Labs Lab 04/28/13 0852 04/30/13 0557  WBC 3.0* 3.3*  NEUTROABS 2.3  --   HGB 12.2* 13.2  HCT 37.6* 40.0  MCV 90.0 89.9  PLT 113* 125*   Cardiac Enzymes:  Recent Labs Lab 04/28/13 0852 04/28/13 2227 04/29/13 1202  TROPONINI  <0.30 <0.30 <0.30   BNP: No components found with this basename: POCBNP,  CBG: No results found for this basename: GLUCAP,  in the last 168 hours   Micro Results: No results found for this or any previous visit (from the past 240 hour(s)).  Studies/Results: Dg Chest 2 View  04/28/2013   *RADIOLOGY REPORT*  Clinical Data: Chest pain and tightness  CHEST - 2 VIEW  Comparison: 12/28/2007  Findings: Normal heart, mediastinal, and hilar contours.  There is atherosclerotic calcification of the thoracic aortic arch. Pulmonary vascularity is upper normal.  The lungs are normally expanded and clear.  No airspace disease, effusion, or edema. Negative for pneumothorax.  No acute osseous abnormality identified.  Visualized upper abdomen is unremarkable  IMPRESSION: No acute cardiopulmonary disease.   Original Report Authenticated By: Britta Mccreedy, M.D.    Medications: Scheduled Meds: . aspirin EC  81 mg Oral Daily  . atorvastatin  20 mg Oral q1800  . dutasteride  0.5 mg Oral Daily  . heparin  5,000 Units Subcutaneous Q8H  . isosorbide mononitrate  120 mg Oral Daily  . levothyroxine  100 mcg Oral QAC breakfast  . metoprolol succinate  100 mg Oral Daily  . pantoprazole  40 mg Oral Daily  . sodium chloride  3 mL Intravenous Q12H  . tamsulosin  0.4 mg Oral Daily      LOS: 2  days   Alem Fahl M.D. [facility] 04/30/2013, 10:22 AM Pager: [phone]  If 7PM-7AM, please contact night-coverage [email or web address] Password [email or web address]

## 2013-05-01 MED ORDER — METOPROLOL SUCCINATE ER 100 MG PO TB24: 100.0000 mg | ORAL_TABLET | Freq: Every day | ORAL | Status: DC

## 2013-05-01 MED ORDER — LISINOPRIL 10 MG PO TABS
10.0000 mg | ORAL_TABLET | Freq: Every day | ORAL | Status: DC
  Administered 2013-05-01: 10 mg via ORAL
  Filled 2013-05-01: qty 1

## 2013-05-01 MED ORDER — LISINOPRIL 10 MG PO TABS: 10.0000 mg | ORAL_TABLET | Freq: Every day | ORAL | Status: DC

## 2013-05-01 NOTE — Discharge Summary (Signed)
Physician Discharge Summary  Patient ID: Dustin Hess MRN: [id number] DOB/AGE: November 07, 1945 67 y.o.  Admit date: 04/28/2013 Discharge date: 05/01/2013  Primary Care Physician:  Laurena Slimmer, MD  Discharge Diagnoses:   Unstable angina with history of coronary artery disease  Accelerated hypertension  . Blindness, congenital . Chest pain, unspecified . GERD (gastroesophageal reflux disease) . BPH (benign prostatic hypertrophy) . HYPOTHYROIDISM . CAD .  HYPERLIPIDEMIA  Consults: Cardiology   Recommendations for Outpatient Follow-up:  Lisinopril was added during this admission, please check renal  panel on the follow-up  Allergies:  No Known Allergies   Discharge Medications:   Medication List         aspirin 81 MG tablet  Take 81 mg by mouth daily.     atorvastatin 20 MG tablet  Commonly known as:  LIPITOR  Take 1 tablet (20 mg total) by mouth daily.     dutasteride 0.5 MG capsule  Commonly known as:  AVODART  Take 0.5 mg by mouth daily.     FLOMAX 0.4 MG Caps  Generic drug:  tamsulosin  Take 0.4 mg by mouth daily.     isosorbide mononitrate 120 MG 24 hr tablet  Commonly known as:  IMDUR  Take 120 mg by mouth daily.     levothyroxine 100 MCG tablet  Commonly known as:  SYNTHROID, LEVOTHROID  Take 100 mcg by mouth daily.     lisinopril 10 MG tablet  Commonly known as:  PRINIVIL,ZESTRIL  Take 1 tablet (10 mg total) by mouth daily.     metoprolol succinate 100 MG 24 hr tablet  Commonly known as:  TOPROL-XL  Take 1 tablet (100 mg total) by mouth daily. Take with or immediately following a meal.     nitroGLYCERIN 0.4 MG SL tablet  Commonly known as:  NITROSTAT  Place 1 tablet (0.4 mg total) under the tongue every 5 (five) minutes as needed.     omeprazole 20 MG capsule  Commonly known as:  PRILOSEC  Take 20 mg by mouth daily.     Vitamin D3 1000 UNITS Caps  Take 1 capsule by mouth daily.         Brief H and P: For complete details  please refer to admission H and P, but in brief Dustin Hess is a 67 y.o. male  Past medical history of hypertension, hypothyroidism, congenital bleeding and CAD status post cath in 2009 with stent who presents to the emergency room chest pain. Patient in usual state of health and then this morning woke up with midsternal chest pain/pressure. Described as heavy and sharp both located midsternal and nonradiating. About a 5/10 in pain. No associated shortness of breath, dizziness or lightheadedness. No associated nausea vomiting. Patient took some nitroglycerin and aspirin and felt better. An hour later, symptoms came back. Patient in the emergency room. Initial set of cardiac enzymes and EKG were unremarkable. Lab work and vitals were all unremarkable. Given nitroglycerin glycerin again this time made the pain completely disappear. Given patient's previous history of felt best to bring him in overnight for observation. Hospitalists were called for admission.   Hospital Course:  Chest pain, unspecified/unstable angina: risk factors include history of CAD status post MI and cardiac stent in 2005, hypertension, hyperlipidemia: Chest pain was resolved at the time of admission. Serial cardiac enzymes remain negative. Cardiology was consulted, given his history of MI in 2005 and above risk factors, nitrate responsive chest pain at rest, patient underwent cardiac cath. Cardiac cath showed  widely patent left circumflex stent, patent LAD, moderate RCA stenosis stable since 2009, hyperdynamic LV function EF 70-75%. Continue aspirin, Imdur, Lipitor, Toprol-XL. Lisinopril was added for his uncontrolled hypertension. He was cleared for discharge today by cardiology.   HYPOTHYROIDISM: Continue Synthroid  HYPERLIPIDEMIA  - Continue statins  GERD (gastroesophageal reflux disease): Continue PPI  BPH (benign prostatic hypertrophy): Continue Flomax     Day of Discharge BP 179/95  Pulse 71  Temp(Src) 98.2 F  (36.8 C) (Oral)  Resp 18  Ht 5\' 4"  (1.626 m)  Wt 76.7 kg (169 lb 1.5 oz)  BMI 29.01 kg/m2  SpO2 98%  Physical Exam: General: Alert and awake oriented x3 not in any acute distress. Blindness CVS: S1-S2 clear no murmur rubs or gallops Chest: clear to auscultation bilaterally, no wheezing rales or rhonchi Abdomen: soft nontender, nondistended, normal bowel sounds Extremities: no cyanosis, clubbing or edema noted bilaterally    The results of significant diagnostics from this hospitalization (including imaging, microbiology, ancillary and laboratory) are listed below for reference.    LAB RESULTS: Basic Metabolic Panel:  Recent Labs Lab 04/28/13 0852 04/30/13 0557  NA 137 141  K 3.9 4.3  CL 103 108  CO2 26 25  GLUCOSE 129* 96  BUN 19 17  CREATININE 1.04 1.03  CALCIUM 9.4 9.0   Liver Function Tests:  Recent Labs Lab 04/30/13 0557  AST 20  ALT 21  ALKPHOS 72  BILITOT 0.5  PROT 7.1  ALBUMIN 3.5   No results found for this basename: LIPASE, AMYLASE,  in the last 168 hours No results found for this basename: AMMONIA,  in the last 168 hours CBC:  Recent Labs Lab 04/28/13 0852 04/30/13 0557  WBC 3.0* 3.3*  NEUTROABS 2.3  --   HGB 12.2* 13.2  HCT 37.6* 40.0  MCV 90.0 89.9  PLT 113* 125*   Cardiac Enzymes:  Recent Labs Lab 04/28/13 2227 04/29/13 1202  TROPONINI <0.30 <0.30   BNP: No components found with this basename: POCBNP,  CBG: No results found for this basename: GLUCAP,  in the last 168 hours  Significant Diagnostic Studies:  Dg Chest 2 View  04/28/2013   *RADIOLOGY REPORT*  Clinical Data: Chest pain and tightness  CHEST - 2 VIEW  Comparison: 12/28/2007  Findings: Normal heart, mediastinal, and hilar contours.  There is atherosclerotic calcification of the thoracic aortic arch. Pulmonary vascularity is upper normal.  The lungs are normally expanded and clear.  No airspace disease, effusion, or edema. Negative for pneumothorax.  No acute osseous  abnormality identified.  Visualized upper abdomen is unremarkable  IMPRESSION: No acute cardiopulmonary disease.   Original Report Authenticated By: Britta Mccreedy, M.D.    Coronary angiography: 04/30/2013 Coronary dominance: right  Left mainstem: Widely patent, arises from left cusp, divides into LAD, LCx, and ramus branch  Left anterior descending (LAD): Widely patent throughout with no obstructive disease. Reaches and wraps the LV apex.  Left circumflex (LCx): Intermediate with 40-50% stenosis, large vessel. AV circumflex with widely patent distal stent.  Right coronary artery (RCA): Dominant vessel. Mid 50%, distal 60% within stent, PDA diffusely diseased. Overall appearance grossly unchanged from last study in 2009.  Left ventriculography: Left ventricular systolic function is hyperdynamic, LVEF is estimated at 70-75%%, there is no significant mitral regurgitation  Final Conclusions:  1. Widely patent LCx stent  2. Patent LAD without significant disease  3. Moderate RCA stenosis stable since 2009 cath study  4. Hyperdynamic LV function  Disposition and Follow-up:     Discharge Orders   Future Orders Complete By Expires     Diet - low sodium heart healthy  As directed     Discharge instructions  As directed     Comments:      Avoid lifting anything heavy with right hand for a week    Increase activity slowly  As directed         DISPOSITION: Home DIET: Heart healthy diet ACTIVITY: As tolerated   DISCHARGE FOLLOW-UP Follow-up Information   Follow up with Laurena Slimmer, MD. Schedule an appointment as soon as possible for a visit in 14 days. (for hospital follow-up)    Contact information:   [address] [place] [address] [phone]       Follow up with Rollene Rotunda, MD. Schedule an appointment as soon as possible for a visit in 2 weeks. (for hospital follow-up)    Contact information:   [address] [place] [address] [phone]       Time spent on Discharge: 35 mins  Signed:   Shizue Kaseman M.D. [facility] 05/01/2013, 10:41 AM Pager: [phone]

## 2013-05-01 NOTE — Progress Notes (Signed)
CARDIOLOGY CONSULT NOTE  Patient ID: Dustin Hess MRN: [id number], DOB/AGE: Feb 22, 1946   Admit date: 04/28/2013 Date of Consult: 05/01/2013  Primary Physician: Laurena Slimmer, MD Primary Cardiologist: Shela Commons. Hochrein, MD   Pt. Profile  67 y/o male with h/o CAD s/p inf MI and RCA/LCX stenting in 2005 who presented to the ED yesterday with recurrent nitrate responsive chest pain.  Problem List  Past Medical History  Diagnosis Date  . Coronary artery disease     a. 06/2004 Inf STEMI with PCI/BMS to RCA/LCX;  b. Cath June 2009, demonstrate left mild calcification, LAD had luminal irregularities, 60% stenosis in the distal portion, circumflex had a stent in the AV groove which was patent. The RCA had a mid stent which was patent. EF 60%. THere was posterolateral branch with 70% stenosis that was narrow in caliber. The EF of 65%;  c. 06/2011 Neg MV, EF 67%.  . Hypothyroidism   . GI bleeding 2006  . Congenital blindness   . Hypertension   . Dyslipidemia   . Benign prostatic hypertrophy   . GERD (gastroesophageal reflux disease)   . Arthritis     Past Surgical History  Procedure Laterality Date  . Esophagogastroduodenoscopy    . Colonoscopy    . Hemorrhoid surgery    . Coronary angioplasty with stent placement      Allergies  No Known Allergies  HPI   67 y/o male with h/o CAD s/p inferior MI in 2005.  At that time the RCA was stented and then the CTO of the LCX was stented in a staged fashion.  BMS' were used in each location.  He's done well over the years and works full-time @ [employer] for [employer].  He was in his USOH until yesterday morning.  He had just gotten up from eating breakfast and developed 5/10 left sided chest pressure w/o associated Ss.  He recognized this as angina and sat down and took a ntg.  Ss began to ease off immediately and so he got back up to get ready for work but Ss promptly worsened.  He sat back down and took another ntg and Ss again began to  ease off.  His son came over and decision was made to take him to the ED.  Upon arrival to the ED, he was more or less pain free.  Total duration of Ss was ~ 20 mins.  ECG was non-acute.  Since admission, CE have been negative.  He is pain free this AM.  Inpatient Medications  . aspirin EC  81 mg Oral Daily  . atorvastatin  20 mg Oral q1800  . dutasteride  0.5 mg Oral Daily  . heparin  5,000 Units Subcutaneous Q8H  . isosorbide mononitrate  120 mg Oral Daily  . levothyroxine  100 mcg Oral QAC breakfast  . metoprolol succinate  100 mg Oral Daily  . pantoprazole  40 mg Oral Daily  . sodium chloride  3 mL Intravenous Q12H  . tamsulosin  0.4 mg Oral Daily   Family History Family History  Problem Relation Age of Onset  . Cancer Mother     died @ 63  . Cancer Father     died @ 54  . Heart attack Cousin     died in her 59's.    Social History History   Social History  . Marital Status: Married    Spouse Name: N/A    Number of Children: N/A  . Years of Education: N/A  Occupational History  . Not on file.   Social History Main Topics  . Smoking status: Never Smoker   . Smokeless tobacco: Never Used  . Alcohol Use: No  . Drug Use: No  . Sexually Active: Not on file   Other Topics Concern  . Not on file   Social History Narrative   Lives in [place] with your wife and dtr.  Works on [employer] for the [employer].  Does not routinely exercise.    Review of Systems  General:  No chills, fever, night sweats or weight changes.  Blind. Cardiovascular:  +++ chest pain, no dyspnea on exertion, edema, orthopnea, palpitations, paroxysmal nocturnal dyspnea. Dermatological: No rash, lesions/masses Respiratory: No cough, dyspnea Urologic: No hematuria, dysuria Abdominal:   No nausea, vomiting, diarrhea, bright red blood per rectum, melena, or hematemesis Neurologic:  No visual changes, wkns, changes in mental status. MSK:  Occasional calf cramping while @ rest. All other  systems reviewed and are otherwise negative except as noted above.  Physical Exam  Blood pressure 179/95, pulse 71, temperature 98.2 F (36.8 C), temperature source Oral, resp. rate 18, height 5\' 4"  (1.626 m), weight 169 lb 1.5 oz (76.7 kg), SpO2 98.00%.  General: Pleasant, NAD Psych: Normal affect. Neuro: Alert and oriented X 3. Moves all extremities spontaneously. HEENT: Normal with exception of blindness. Neck: Supple without bruits or JVD. Lungs:  Resp regular and unlabored, CTA. Heart: RRR no s3, s4, or murmurs. Abdomen: Soft, non-tender, non-distended, BS + x 4.  Extremities: No clubbing, cyanosis or edema. DP/PT/Radials 2+ and equal bilaterally.  Labs   Recent Labs  04/28/13 2227 04/29/13 1202  TROPONINI <0.30 <0.30   Lab Results  Component Value Date   WBC 3.3* 04/30/2013   HGB 13.2 04/30/2013   HCT 40.0 04/30/2013   MCV 89.9 04/30/2013   PLT 125* 04/30/2013     Recent Labs Lab 04/30/13 0557  NA 141  K 4.3  CL 108  CO2 25  BUN 17  CREATININE 1.03  CALCIUM 9.0  PROT 7.1  BILITOT 0.5  ALKPHOS 72  ALT 21  AST 20  GLUCOSE 96   Lab Results  Component Value Date   CHOL 120 07/01/2012   HDL 32.50* 07/01/2012   LDLCALC 73 07/01/2012   TRIG 75.0 07/01/2012   Radiology/Studies  Dg Chest 2 View  04/28/2013   *RADIOLOGY REPORT*  Clinical Data: Chest pain and tightness  CHEST - 2 VIEW  Comparison: 12/28/2007  Findings: Normal heart, mediastinal, and hilar contours.  There is atherosclerotic calcification of the thoracic aortic arch. Pulmonary vascularity is upper normal.  The lungs are normally expanded and clear.  No airspace disease, effusion, or edema. Negative for pneumothorax.  No acute osseous abnormality identified.  Visualized upper abdomen is unremarkable  IMPRESSION: No acute cardiopulmonary disease.   Original Report Authenticated By: Britta Mccreedy, M.D.   ECG  Rsr, 92, LAD, no acute st/t changes.  ASSESSMENT AND PLAN  1.  [place]/CAD:  Pt is s/p MI in 2005  with bare metal stents to the RCA and LCX at that time.  Cath in 2009 showed stable anatomy and MV in 06/2011 was non-ischemic.  He had nitrate responsive chest pain yesterday that lasted about 20 mins.  He has no objective evidence of ischemia since admission.  Ss obviously concerning for ischemia.  Cont asa, statin, bb, nitrate.    Cath showed a widely patent stent and minimal CAD elsewhere.  He should not lift anything heavy  with his right hand for a week.  He should be able to go home today.  BP is a bit high.   Will add Lisinopril 10 mg a day.  His other medical problems are stable.   Vesta Mixer, Montez Hageman., MD, [facility] 05/01/2013, 9:54 AM Office - [phone] Pager [phone]

## 2013-05-17 ENCOUNTER — Encounter: Payer: Self-pay | Admitting: Cardiology

## 2013-05-18 ENCOUNTER — Ambulatory Visit (INDEPENDENT_AMBULATORY_CARE_PROVIDER_SITE_OTHER): Payer: Medicare Other | Admitting: Nurse Practitioner

## 2013-05-18 ENCOUNTER — Encounter: Payer: Self-pay | Admitting: Cardiology

## 2013-05-18 ENCOUNTER — Encounter: Payer: Self-pay | Admitting: Nurse Practitioner

## 2013-05-18 VITALS — BP 174/86 | HR 76 | Ht 65.0 in | Wt 170.8 lb

## 2013-05-18 DIAGNOSIS — R079 Chest pain, unspecified: Secondary | ICD-10-CM

## 2013-05-18 DIAGNOSIS — I10 Essential (primary) hypertension: Secondary | ICD-10-CM

## 2013-05-18 MED ORDER — LISINOPRIL 10 MG PO TABS: 10.0000 mg | ORAL_TABLET | Freq: Every day | ORAL | Status: DC

## 2013-05-18 NOTE — Progress Notes (Signed)
[name] Date of Birth: 08-13-1946 Medical Record #[id number]  History of Present Illness: [name] is seen back today for a post hospital visit. Seen for Dr. Antoine Poche. Has known CAD with past inferior MI and RCA/LCX stenting in 2005. Had follow up cath in 2009 showing stable anatomy and a non ischemic Myoview in September of 2012. He is blind.   Presented at the end of July with chest pain - responsive to NTG - no objective evidence of ischemia - high BP was treated and Lisinopril was to be added back to his regimen.   Comes back today. Here alone. He is blind. Doing well. No recurrent chest pain. Did not get the prescription filled for his Lisinopril. No way of checking BP at home. He feels good. No NTG use. He is anxious to go back to work.   Current Outpatient Prescriptions  Medication Sig Dispense Refill  . aspirin 81 MG tablet Take 81 mg by mouth daily.        Marland Kitchen atorvastatin (LIPITOR) 20 MG tablet Take 1 tablet (20 mg total) by mouth daily.  30 tablet  12  . Cholecalciferol (VITAMIN D3) 1000 UNITS CAPS Take 1 capsule by mouth daily.        Marland Kitchen dutasteride (AVODART) 0.5 MG capsule Take 0.5 mg by mouth daily.        . isosorbide mononitrate (IMDUR) 120 MG 24 hr tablet Take 120 mg by mouth daily.      Marland Kitchen levothyroxine (SYNTHROID, LEVOTHROID) 100 MCG tablet Take 100 mcg by mouth daily.        . metoprolol succinate (TOPROL-XL) 100 MG 24 hr tablet Take 1 tablet (100 mg total) by mouth daily. Take with or immediately following a meal.  30 tablet  5  . nitroGLYCERIN (NITROSTAT) 0.4 MG SL tablet Place 1 tablet (0.4 mg total) under the tongue every 5 (five) minutes as needed.  25 tablet  5  . omeprazole (PRILOSEC) 20 MG capsule Take 20 mg by mouth daily.        . Tamsulosin HCl (FLOMAX) 0.4 MG CAPS Take 0.4 mg by mouth daily.         No current facility-administered medications for this visit.    No Known Allergies  Past Medical History  Diagnosis Date  . Coronary  artery disease     a. 06/2004 Inf STEMI with PCI/BMS to RCA/LCX;  b. Cath June 2009, demonstrate left mild calcification, LAD had luminal irregularities, 60% stenosis in the distal portion, circumflex had a stent in the AV groove which was patent. The RCA had a mid stent which was patent. EF 60%. THere was posterolateral branch with 70% stenosis that was narrow in caliber. The EF of 65%;  c. 06/2011 Neg MV, EF 67%.  . Hypothyroidism   . GI bleeding 2006  . Congenital blindness   . Hypertension   . Dyslipidemia   . Benign prostatic hypertrophy   . GERD (gastroesophageal reflux disease)   . Arthritis     Past Surgical History  Procedure Laterality Date  . Esophagogastroduodenoscopy    . Colonoscopy    . Hemorrhoid surgery    . Coronary angioplasty with stent placement      History  Smoking status  . Never Smoker   Smokeless tobacco  . Never Used    History  Alcohol Use No    Family History  Problem Relation Age of Onset  . Cancer Mother     died @ 78  .  Cancer Father     died @ 58  . Heart attack Cousin     died in her 4's.    Review of Systems: The review of systems is per the HPI.  All other systems were reviewed and are negative.  Physical Exam: BP 174/86  Pulse 76  Ht 5\' 5"  (1.651 m)  Wt 170 lb 12.8 oz (77.474 kg)  BMI 28.42 kg/m2 BP by me is 164/90.  Patient is very pleasant and in no acute distress. Skin is warm and dry. Color is normal.  HEENT is unremarkable but he is blind. Normocephalic/atraumatic. PERRL. Sclera are nonicteric. Neck is supple. No masses. No JVD. Lungs are clear. Cardiac exam shows a regular rate and rhythm. Abdomen is soft. Extremities are without edema. Gait and ROM are intact. No gross neurologic deficits noted.  LABORATORY DATA:  Lab Results  Component Value Date   WBC 3.3* 04/30/2013   HGB 13.2 04/30/2013   HCT 40.0 04/30/2013   PLT 125* 04/30/2013   GLUCOSE 96 04/30/2013   CHOL 120 07/01/2012   TRIG 75.0 07/01/2012   HDL 32.50*  07/01/2012   LDLCALC 73 07/01/2012   ALT 21 04/30/2013   AST 20 04/30/2013   NA 141 04/30/2013   K 4.3 04/30/2013   CL 108 04/30/2013   CREATININE 1.03 04/30/2013   BUN 17 04/30/2013   CO2 25 04/30/2013   INR 1.02 04/30/2013    Assessment / Plan: 1. Chest pain - with last cath in 2009 showing stable anatomy, non ischemic Myoview back in September of 2012. He has had NO recurrence of symptoms. Doing well. Ok to return to work.   2. HTN - did not get the prescription filled for the Lisinopril - he will get filled and start taking.   I will see him back in 4 weeks. Check BMETon return.   Patient is agreeable to this plan and will call if any problems develop in the interim.   Dustin Macadamia, RN, ANP-C [facility] [address] [place], [place]  [address]

## 2013-05-18 NOTE — Patient Instructions (Addendum)
We are restarting the Lisinopril at 10 mg daily  I will see you in a month with lab work  Ok to return to work  Call the [employer] office at [phone] if you have any questions, problems or concerns.

## 2013-05-25 ENCOUNTER — Encounter: Payer: Medicare Other | Admitting: Physician Assistant

## 2013-06-15 ENCOUNTER — Encounter: Payer: Self-pay | Admitting: Nurse Practitioner

## 2013-06-15 ENCOUNTER — Ambulatory Visit (INDEPENDENT_AMBULATORY_CARE_PROVIDER_SITE_OTHER): Payer: Medicare Other | Admitting: Nurse Practitioner

## 2013-06-15 VITALS — BP 130/80 | HR 72 | Ht 65.0 in | Wt 173.8 lb

## 2013-06-15 DIAGNOSIS — I10 Essential (primary) hypertension: Secondary | ICD-10-CM

## 2013-06-15 LAB — BASIC METABOLIC PANEL
BUN: 15 mg/dL (ref 6–23)
CO2: 29 mEq/L (ref 19–32)
Calcium: 8.9 mg/dL (ref 8.4–10.5)
Chloride: 107 mEq/L (ref 96–112)
Creatinine, Ser: 1 mg/dL (ref 0.4–1.5)
GFR: 94.42 mL/min (ref >60.00)
Glucose, Bld: 94 mg/dL (ref 70–99)
Potassium: 3.7 mEq/L (ref 3.5–5.1)
Sodium: 140 mEq/L (ref 135–145)

## 2013-06-15 MED ORDER — LOSARTAN POTASSIUM 50 MG PO TABS: 50.0000 mg | ORAL_TABLET | Freq: Every day | ORAL | Status: DC

## 2013-06-15 NOTE — Progress Notes (Signed)
Dustin Hess Date of Birth: 20-Aug-1946 Medical Record #[id number]  History of Present Illness: [name] is seen back today for a one month check. Seen for Dr. Antoine Poche. He has known CAD with past inferior MI and RCA/LCX stenting in 2005. Had follow up cath in 2009 showing stable anatomy and a non ischemic Myoview in September of 2012. Other issues include blindness, HLD, BPH and hypothyroidism.   He presented at the end of July with chest pain - no objective evidence of ischemia. BP was high and ACE was started.  I saw him back a month ago. He was doing well but had not picked up the lisinopril. BP still high. He had had no more chest pain.   Comes back today. Here with his brother. He is doing well. No more chest pain. BP has come down nicely. Does have a cough - on ACE - and just started with the start of the ACE. Not short of breath. Back working.    Current Outpatient Prescriptions  Medication Sig Dispense Refill  . acetaminophen (ARTHRITIS PAIN RELIEVER) 650 MG CR tablet Take 650 mg by mouth every 8 (eight) hours as needed for pain.      Marland Kitchen aspirin 81 MG tablet Take 81 mg by mouth daily.        Marland Kitchen atorvastatin (LIPITOR) 20 MG tablet Take 1 tablet (20 mg total) by mouth daily.  30 tablet  12  . Cholecalciferol (VITAMIN D3) 1000 UNITS CAPS Take 1 capsule by mouth daily.        Marland Kitchen dutasteride (AVODART) 0.5 MG capsule Take 0.5 mg by mouth daily.        . isosorbide mononitrate (IMDUR) 120 MG 24 hr tablet Take 120 mg by mouth daily.      Marland Kitchen levothyroxine (SYNTHROID, LEVOTHROID) 100 MCG tablet Take 100 mcg by mouth daily.        Marland Kitchen lisinopril (PRINIVIL,ZESTRIL) 10 MG tablet Take 1 tablet (10 mg total) by mouth daily.  90 tablet  3  . metoprolol succinate (TOPROL-XL) 100 MG 24 hr tablet Take 1 tablet (100 mg total) by mouth daily. Take with or immediately following a meal.  30 tablet  5  . nitroGLYCERIN (NITROSTAT) 0.4 MG SL tablet Place 1 tablet (0.4 mg total) under the tongue  every 5 (five) minutes as needed.  25 tablet  5  . omeprazole (PRILOSEC) 20 MG capsule Take 20 mg by mouth daily.        . Tamsulosin HCl (FLOMAX) 0.4 MG CAPS Take 0.4 mg by mouth daily.         No current facility-administered medications for this visit.    No Known Allergies  Past Medical History  Diagnosis Date  . Coronary artery disease     a. 06/2004 Inf STEMI with PCI/BMS to RCA/LCX;  b. Cath June 2009, demonstrate left mild calcification, LAD had luminal irregularities, 60% stenosis in the distal portion, circumflex had a stent in the AV groove which was patent. The RCA had a mid stent which was patent. EF 60%. THere was posterolateral branch with 70% stenosis that was narrow in caliber. The EF of 65%;  c. 06/2011 Neg MV, EF 67%.  . Hypothyroidism   . GI bleeding 2006  . Congenital blindness   . Hypertension   . Dyslipidemia   . Benign prostatic hypertrophy   . GERD (gastroesophageal reflux disease)   . Arthritis     Past Surgical History  Procedure Laterality Date  . Esophagogastroduodenoscopy    .  Colonoscopy    . Hemorrhoid surgery    . Coronary angioplasty with stent placement      History  Smoking status  . Never Smoker   Smokeless tobacco  . Never Used    History  Alcohol Use No    Family History  Problem Relation Age of Onset  . Cancer Mother     died @ 93  . Cancer Father     died @ 57  . Heart attack Cousin     died in her 67's.    Review of Systems: The review of systems is per the HPI.  All other systems were reviewed and are negative.  Physical Exam: BP 130/80  Pulse 72  Ht 5\' 5"  (1.651 m)  Wt 173 lb 12.8 oz (78.835 kg)  BMI 28.92 kg/m2 Patient is very pleasant and in no acute distress. Skin is warm and dry. Color is normal.  HEENT is unremarkable except he is blind. Normocephalic/atraumatic. PERRL. Sclera are nonicteric. Neck is supple. No masses. No JVD. Lungs are clear. Cardiac exam shows an irregular rhythm. Rate is ok. Abdomen is  soft. Extremities are without edema. Gait and ROM are intact. No gross neurologic deficits noted.  LABORATORY DATA: BMET is pending  Lab Results  Component Value Date   WBC 3.3* 04/30/2013   HGB 13.2 04/30/2013   HCT 40.0 04/30/2013   PLT 125* 04/30/2013   GLUCOSE 96 04/30/2013   CHOL 120 07/01/2012   TRIG 75.0 07/01/2012   HDL 32.50* 07/01/2012   LDLCALC 73 07/01/2012   ALT 21 04/30/2013   AST 20 04/30/2013   NA 141 04/30/2013   K 4.3 04/30/2013   CL 108 04/30/2013   CREATININE 1.03 04/30/2013   BUN 17 04/30/2013   CO2 25 04/30/2013   INR 1.02 04/30/2013     Assessment / Plan: 1. HTN - BP looks much better. He does report a cough - will change to Losartan 50 mg a day. Check BMET today. See him back in 4 months.  2. Chest pain - last cath in 2009 showing stable anatomy and negative Myoview in 2012. Managed medically. No more chest pain reported. I do not think further testing is indicated at this time. Will follow.   3. Irregular heart rhythm - will check EKG today - this shows sinus rhythm today. Does have some Q's in V2 - tracing reviewed with Dr. Antoine Poche today - he feels like this is ok - no further testing needed at this time.   Tentatively see him back in 4 months.   Patient is agreeable to this plan and will call if any problems develop in the interim.   Dustin Macadamia, RN, ANP-C [facility] [address] [place], [place]  [address]

## 2013-06-15 NOTE — Patient Instructions (Addendum)
We need to check a potassium level today.   Stay on your current medicines but I am going to stop the Lisinopril and put you on Losartan 50 mg a day - this should make your cough go away  We will find out about your FMLA papers  See Dr. Antoine Poche in 4 months  Call the [facility] office at [phone] if you have any questions, problems or concerns.

## 2013-07-20 ENCOUNTER — Other Ambulatory Visit: Payer: Self-pay | Admitting: Cardiology

## 2013-09-29 ENCOUNTER — Encounter: Payer: Self-pay | Admitting: *Deleted

## 2013-10-08 ENCOUNTER — Encounter: Payer: Self-pay | Admitting: Cardiology

## 2013-10-08 ENCOUNTER — Ambulatory Visit (INDEPENDENT_AMBULATORY_CARE_PROVIDER_SITE_OTHER): Payer: Medicare Other | Admitting: Cardiology

## 2013-10-08 VITALS — BP 122/60 | HR 80 | Ht 65.0 in | Wt 166.0 lb

## 2013-10-08 DIAGNOSIS — I2510 Atherosclerotic heart disease of native coronary artery without angina pectoris: Secondary | ICD-10-CM

## 2013-10-08 MED ORDER — NITROGLYCERIN 0.4 MG SL SUBL: 0.4000 mg | SUBLINGUAL_TABLET | SUBLINGUAL | Status: DC | PRN

## 2013-10-08 NOTE — Patient Instructions (Signed)
The current medical regimen is effective;  continue present plan and medications.  Follow up in 1 year with Dr Hochrein.  You will receive a letter in the mail 2 months before you are due.  Please call us when you receive this letter to schedule your follow up appointment.  

## 2013-10-08 NOTE — Progress Notes (Signed)
HPI The patient presents for followup of his known coronary disease.  He was hospitalized in August with some chest discomfort. Cardiac catheterization demonstrated a patent circumflex. There was no clear etiology for his complaints. However, he's had no further chest pain. He continues to work.  The patient denies any new symptoms such as chest discomfort, neck or arm discomfort. There has been no new shortness of breath, PND or orthopnea. There have been no reported palpitations, presyncope or syncope.  He thinks it was related to the stress of his cousin dying.   No Known Allergies  Current Outpatient Prescriptions  Medication Sig Dispense Refill  . acetaminophen (ARTHRITIS PAIN RELIEVER) 650 MG CR tablet Take 650 mg by mouth every 8 (eight) hours as needed for pain.      Marland Kitchen aspirin 81 MG tablet Take 81 mg by mouth daily.        Marland Kitchen atorvastatin (LIPITOR) 20 MG tablet Take 1 tablet (20 mg total) by mouth daily.  30 tablet  12  . Cholecalciferol (VITAMIN D3) 1000 UNITS CAPS Take 1 capsule by mouth daily.        Marland Kitchen dutasteride (AVODART) 0.5 MG capsule Take 0.5 mg by mouth daily.        . isosorbide mononitrate (IMDUR) 120 MG 24 hr tablet TAKE ONE (1) TABLET EACH DAY  30 tablet  2  . levothyroxine (SYNTHROID, LEVOTHROID) 100 MCG tablet Take 100 mcg by mouth daily.        Marland Kitchen losartan (COZAAR) 50 MG tablet Take 1 tablet (50 mg total) by mouth daily.  90 tablet  3  . metoprolol succinate (TOPROL-XL) 100 MG 24 hr tablet Take 1 tablet (100 mg total) by mouth daily. Take with or immediately following a meal.  30 tablet  5  . nitroGLYCERIN (NITROSTAT) 0.4 MG SL tablet Place 1 tablet (0.4 mg total) under the tongue every 5 (five) minutes as needed.  25 tablet  5  . omeprazole (PRILOSEC) 20 MG capsule Take 20 mg by mouth daily.        . Tamsulosin HCl (FLOMAX) 0.4 MG CAPS Take 0.4 mg by mouth daily.         No current facility-administered medications for this visit.    Past Medical History  Diagnosis  Date  . Coronary artery disease     a. 06/2004 Inf STEMI with PCI/BMS to RCA/LCX;  b. Cath June 2009, demonstrate left mild calcification, LAD had luminal irregularities, 60% stenosis in the distal portion, circumflex had a stent in the AV groove which was patent. The RCA had a mid stent which was patent. EF 60%. THere was posterolateral branch with 70% stenosis that was narrow in caliber. The EF of 65%;  c. 06/2011 Neg MV, EF 67%.  . Hypothyroidism   . GI bleeding 2006  . Congenital blindness   . Hypertension   . Dyslipidemia   . Benign prostatic hypertrophy   . GERD (gastroesophageal reflux disease)   . Arthritis     Past Surgical History  Procedure Laterality Date  . Esophagogastroduodenoscopy    . Colonoscopy    . Hemorrhoid surgery    . Coronary angioplasty with stent placement      ROS: Blind.  Otherwise as stated in the HPI and negative for all other systems.  PHYSICAL EXAM BP 122/60  Pulse 80  Ht 5\' 5"  (1.651 m)  Wt 166 lb (75.297 kg)  BMI 27.62 kg/m2 GENERAL:  Well appearing HEENT:  Pupils nonreactive, fundi not  visualized, lens opacification, oral mucosa unremarkable NECK:  No jugular venous distention, waveform within normal limits, carotid upstroke brisk and symmetric, no bruits, no thyromegaly LUNGS:  Clear to auscultation bilaterally CHEST:  Unremarkable HEART:  PMI not displaced or sustained,S1 and S2 within normal limits, no S3, no S4, no clicks, no rubs, no murmurs ABD:  Flat, positive bowel sounds normal in frequency in pitch, no bruits, no rebound, no guarding, no midline pulsatile mass, no hepatomegaly, no splenomegaly EXT:  2 plus pulses throughout, no edema, no cyanosis no clubbing   EKG:  NSR, rate 78, leftward axis, possible RAE, no acute ST T wave changes.   10/08/2013  ASSESSMENT AND PLAN   CAD -  The patient has no new sypmtoms.  No further cardiovascular testing is indicated.  We will continue with aggressive risk reduction and meds as  listed.  ESSENTIAL HYPERTENSION, BENIGN -  The blood pressure is at target. No change in medications is indicated. We will continue with therapeutic lifestyle changes (TLC).  HYPERLIPIDEMIA -

## 2013-10-15 ENCOUNTER — Ambulatory Visit: Payer: Medicare Other | Admitting: Cardiology

## 2013-10-18 ENCOUNTER — Other Ambulatory Visit: Payer: Self-pay | Admitting: Cardiology

## 2013-10-19 ENCOUNTER — Other Ambulatory Visit: Payer: Self-pay | Admitting: Cardiology

## 2013-12-14 ENCOUNTER — Telehealth: Payer: Self-pay | Admitting: Hematology and Oncology

## 2013-12-14 NOTE — Telephone Encounter (Signed)
S/W AMY @  DR. [facility] OFFICE AND GAVE NEW PATIENT APPT FOR 03/25 @ 1:45 W/DR. GORSUCH.  REFERRING DR. Margaretmary BayleyPRESTON CLARK DX- LOW PLTS WELCOME PACKET MAILED.

## 2013-12-21 ENCOUNTER — Telehealth: Payer: Self-pay | Admitting: Hematology and Oncology

## 2013-12-21 NOTE — Telephone Encounter (Signed)
C/D 12/21/13 for appt. 12/22/13 °

## 2013-12-22 ENCOUNTER — Ambulatory Visit (HOSPITAL_BASED_OUTPATIENT_CLINIC_OR_DEPARTMENT_OTHER): Payer: Medicare Other

## 2013-12-22 ENCOUNTER — Ambulatory Visit (HOSPITAL_BASED_OUTPATIENT_CLINIC_OR_DEPARTMENT_OTHER): Payer: Medicare Other | Admitting: Hematology and Oncology

## 2013-12-22 ENCOUNTER — Encounter: Payer: Self-pay | Admitting: Hematology and Oncology

## 2013-12-22 ENCOUNTER — Telehealth: Payer: Self-pay | Admitting: Hematology and Oncology

## 2013-12-22 VITALS — BP 134/66 | HR 77 | Temp 98.2°F | Resp 18 | Ht 65.0 in | Wt 174.2 lb

## 2013-12-22 DIAGNOSIS — D696 Thrombocytopenia, unspecified: Secondary | ICD-10-CM

## 2013-12-22 DIAGNOSIS — D649 Anemia, unspecified: Secondary | ICD-10-CM

## 2013-12-22 DIAGNOSIS — D72819 Decreased white blood cell count, unspecified: Secondary | ICD-10-CM

## 2013-12-22 HISTORY — DX: Decreased white blood cell count, unspecified: D72.819

## 2013-12-22 HISTORY — DX: Anemia, unspecified: D64.9

## 2013-12-22 LAB — IRON AND TIBC CHCC
%SAT: 15 % — AB (ref 20–55)
IRON: 49 ug/dL (ref 42–163)
TIBC: 333 ug/dL (ref 202–409)
UIBC: 283 ug/dL (ref 117–376)

## 2013-12-22 LAB — SEDIMENTATION RATE: Sed Rate: 4 mm/hr (ref 0–16)

## 2013-12-22 LAB — CBC & DIFF AND RETIC
BASO%: 0.6 % (ref 0.0–2.0)
Basophils Absolute: 0 10*3/uL (ref 0.0–0.1)
EOS%: 0.8 % (ref 0.0–7.0)
Eosinophils Absolute: 0 10*3/uL (ref 0.0–0.5)
HCT: 38.5 % (ref 38.4–49.9)
HGB: 12.3 g/dL — ABNORMAL LOW (ref 13.0–17.1)
IMMATURE RETIC FRACT: 12.7 % — AB (ref 3.00–10.60)
LYMPH#: 0.5 10*3/uL — AB (ref 0.9–3.3)
LYMPH%: 14.7 % (ref 14.0–49.0)
MCH: 27.9 pg (ref 27.2–33.4)
MCHC: 31.9 g/dL — AB (ref 32.0–36.0)
MCV: 87.3 fL (ref 79.3–98.0)
MONO#: 0.4 10*3/uL (ref 0.1–0.9)
MONO%: 12.2 % (ref 0.0–14.0)
NEUT#: 2.5 10*3/uL (ref 1.5–6.5)
NEUT%: 71.7 % (ref 39.0–75.0)
Platelets: 125 10*3/uL — ABNORMAL LOW (ref 140–400)
RBC: 4.41 10*6/uL (ref 4.20–5.82)
RDW: 14.6 % (ref 11.0–14.6)
RETIC %: 1.53 % (ref 0.80–1.80)
RETIC CT ABS: 67.47 10*3/uL (ref 34.80–93.90)
WBC: 3.5 10*3/uL — ABNORMAL LOW (ref 4.0–10.3)

## 2013-12-22 LAB — CHCC SMEAR

## 2013-12-22 LAB — TECHNOLOGIST REVIEW

## 2013-12-22 LAB — FERRITIN CHCC: Ferritin: 12 ng/ml — ABNORMAL LOW (ref 22–316)

## 2013-12-22 NOTE — Progress Notes (Signed)
[facility] CONSULT NOTE  Patient Care Team: Laurena Slimmer, MD as PCP - General (Endocrinology) Laurena Slimmer, MD as Referring Physician (Internal Medicine)  CHIEF COMPLAINTS/PURPOSE OF CONSULTATION:  Pancytopenia  HISTORY OF PRESENTING ILLNESS:  Dustin Hess 68 y.o. male is here because of abnormal CBC. He was found to have abnormal CBC from routine blood work. I had the opportunity to review his CBC since March of 2009. At that time, the patient had mild thrombocytopenia with a platelet count of 133,000. Starting July of 2014, the patient was noted to have leukopenia, anemia and thrombocytopenia. His white blood cell count was 3, hemoglobin 12.2 and platelet count of 113,000. That was repeated in August 2014 and the anemia resolved but the patient had persistent leukopenia and thrombocytopenia. Recently, on 12/09/2013, blood test show a white count of 3, hemoglobin 12.1 and platelet count of 132,000. Serum chemistries, thyroid function test were within normal limits. His last B12 and folate level from October 2013 were normal. He denies recent chest pain on exertion, shortness of breath on minimal exertion, pre-syncopal episodes, or palpitations. He had not noticed any recent bleeding such as epistaxis, hematuria or hematochezia The patient denies over the counter NSAID ingestion. He is on antiplatelets agents for prevention against heart disease. He had no prior history or diagnosis of cancer. His age appropriate screening programs are up-to-date. He denies any pica and eats a variety of diet. He never donated blood but did receive blood transfusion for his heart surgery and prior history of GI bleed. EGD was negative. The patient had presumed diverticular bleed. He denies recent infections. MEDICAL HISTORY:  Past Medical History  Diagnosis Date  . Coronary artery disease     a. 06/2004 Inf STEMI with PCI/BMS to RCA/LCX;  b. Cath June 2009, demonstrate left  mild calcification, LAD had luminal irregularities, 60% stenosis in the distal portion, circumflex had a stent in the AV groove which was patent. The RCA had a mid stent which was patent. EF 60%. THere was posterolateral branch with 70% stenosis that was narrow in caliber. The EF of 65%;  c. 06/2011 Neg MV, EF 67%.  . Hypothyroidism   . GI bleeding 2006  . Congenital blindness   . Hypertension   . Dyslipidemia   . Benign prostatic hypertrophy   . GERD (gastroesophageal reflux disease)   . Arthritis   . Leukopenia 12/22/2013  . Anemia, unspecified 12/22/2013    SURGICAL HISTORY: Past Surgical History  Procedure Laterality Date  . Esophagogastroduodenoscopy    . Colonoscopy    . Hemorrhoid surgery    . Coronary angioplasty with stent placement      SOCIAL HISTORY: History   Social History  . Marital Status: Married    Spouse Name: N/A    Number of Children: N/A  . Years of Education: N/A   Occupational History  . Not on file.   Social History Main Topics  . Smoking status: Never Smoker   . Smokeless tobacco: Never Used  . Alcohol Use: No  . Drug Use: No  . Sexual Activity: Not on file   Other Topics Concern  . Not on file   Social History Narrative   Lives in [place] with your wife and dtr.  Works on [employer] for the [employer].  Does not routinely exercise.    FAMILY HISTORY: Family History  Problem Relation Age of Onset  . Cancer Mother 3    kidney  . Cancer  Father [phone]    blood cancer  . Heart attack Cousin     died in her 4040's.    ALLERGIES:  has No Known Allergies.  MEDICATIONS:  Current Outpatient Prescriptions  Medication Sig Dispense Refill  . acetaminophen (ARTHRITIS PAIN RELIEVER) 650 MG CR tablet Take 650 mg by mouth every 8 (eight) hours as needed for pain.      Marland Kitchen. aspirin 81 MG tablet Take 81 mg by mouth daily.        Marland Kitchen. atorvastatin (LIPITOR) 20 MG tablet TAKE ONE (1) TABLET EACH DAY  30 tablet  11  . Cholecalciferol (VITAMIN D3) 1000  UNITS CAPS Take 1 capsule by mouth daily.        Marland Kitchen. dutasteride (AVODART) 0.5 MG capsule Take 0.5 mg by mouth daily.        . isosorbide mononitrate (IMDUR) 120 MG 24 hr tablet TAKE 1 TABLET BY MOUTH EVERY DAY  30 tablet  11  . levothyroxine (SYNTHROID, LEVOTHROID) 100 MCG tablet Take 100 mcg by mouth daily.        Marland Kitchen. losartan (COZAAR) 50 MG tablet Take 1 tablet (50 mg total) by mouth daily.  90 tablet  3  . metoprolol succinate (TOPROL-XL) 100 MG 24 hr tablet Take 1 tablet (100 mg total) by mouth daily. Take with or immediately following a meal.  30 tablet  5  . nitroGLYCERIN (NITROSTAT) 0.4 MG SL tablet Place 1 tablet (0.4 mg total) under the tongue every 5 (five) minutes as needed.  25 tablet  5  . omeprazole (PRILOSEC) 20 MG capsule Take 20 mg by mouth daily.        . Tamsulosin HCl (FLOMAX) 0.4 MG CAPS Take 0.4 mg by mouth daily.         No current facility-administered medications for this visit.    REVIEW OF SYSTEMS:   Constitutional: Denies fevers, chills or abnormal night sweats Eyes: Denies blurriness of vision, double vision or watery eyes Ears, nose, mouth, throat, and face: Denies mucositis or sore throat Respiratory: Denies cough, dyspnea or wheezes Cardiovascular: Denies palpitation, chest discomfort or lower extremity swelling Gastrointestinal:  Denies nausea, heartburn or change in bowel habits Skin: Denies abnormal skin rashes Lymphatics: Denies new lymphadenopathy or easy bruising Neurological:Denies numbness, tingling or new weaknesses. He is legally blind Behavioral/Psych: Mood is stable, no new changes  All other systems were reviewed with the patient and are negative.  PHYSICAL EXAMINATION: ECOG PERFORMANCE STATUS: 0 - Asymptomatic  Filed Vitals:   12/22/13 1409  BP: 134/66  Pulse: 77  Temp: 98.2 F (36.8 C)  Resp: 18   Filed Weights   12/22/13 1409  Weight: 174 lb 3.2 oz (79.017 kg)    GENERAL:alert, no distress and comfortable. The patient is  moderately obese SKIN: skin color, texture, turgor are normal, no rashes or significant lesions EYES: Eyes are not examined. The patient has congenital blindness OROPHARYNX:no exudate, no erythema and lips, buccal mucosa, and tongue normal  NECK: supple, thyroid normal size, non-tender, without nodularity LYMPH:  no palpable lymphadenopathy in the cervical, axillary or inguinal LUNGS: clear to auscultation and percussion with normal breathing effort HEART: regular rate & rhythm and no murmurs and no lower extremity edema ABDOMEN:abdomen soft, non-tender and normal bowel sounds. Limited examination to to moderate abdomen obesity. Unable to appreciate hepato-splenomegaly. Musculoskeletal:no cyanosis of digits and no clubbing  PSYCH: alert & oriented x 3 with fluent speech NEURO: no focal motor/sensory deficits  LABORATORY DATA:  I have reviewed  the data as listed Lab Results  Component Value Date   WBC 3.3* 04/30/2013   HGB 13.2 04/30/2013   HCT 40.0 04/30/2013   MCV 89.9 04/30/2013   PLT 125* 04/30/2013    RADIOGRAPHIC STUDIES: I reviewed his last CT scan of the abdomen and pelvis dated December 2006 and agree with the interpretation that the patient has fatty liver disease. No splenomegaly was seen then. ASSESSMENT & PLAN:  #1 Anemia #2 leukopenia #3 thrombocytopenia I suspect the etiology is benign, likely related to chronic liver disease with possible mild splenomegaly causing sequestration. Nutritional deficiency has been ruled out in the past with normal B12 and folate levels. Due to history of GI bleed, I will order iron studies. Certainly early bone marrow malignancy could be a possibility. I will see him back next week to review test results. All questions were answered. The patient knows to call the clinic with any problems, questions or concerns. I spent 40 minutes counseling the patient face to face. The total time spent in the appointment was 55 minutes and more than 50% was on  counseling.     [facility], Renaye Janicki, MD 12/22/2013 2:36 PM

## 2013-12-22 NOTE — Telephone Encounter (Signed)
per pof labs ordered for today and appt set for 4/3 @ 2:45/[facility]

## 2013-12-30 ENCOUNTER — Ambulatory Visit: Payer: Medicare Other | Admitting: Hematology and Oncology

## 2013-12-30 ENCOUNTER — Other Ambulatory Visit: Payer: Medicare Other

## 2013-12-31 ENCOUNTER — Other Ambulatory Visit: Payer: Medicare Other

## 2013-12-31 ENCOUNTER — Ambulatory Visit: Payer: Medicare Other

## 2013-12-31 ENCOUNTER — Ambulatory Visit (HOSPITAL_BASED_OUTPATIENT_CLINIC_OR_DEPARTMENT_OTHER): Payer: Medicare Other | Admitting: Hematology and Oncology

## 2013-12-31 ENCOUNTER — Encounter: Payer: Self-pay | Admitting: Hematology and Oncology

## 2013-12-31 VITALS — BP 117/65 | HR 75 | Temp 98.9°F | Resp 20 | Ht 65.0 in | Wt 166.0 lb

## 2013-12-31 DIAGNOSIS — D509 Iron deficiency anemia, unspecified: Secondary | ICD-10-CM

## 2013-12-31 DIAGNOSIS — D649 Anemia, unspecified: Secondary | ICD-10-CM

## 2013-12-31 DIAGNOSIS — D696 Thrombocytopenia, unspecified: Secondary | ICD-10-CM

## 2013-12-31 DIAGNOSIS — D72819 Decreased white blood cell count, unspecified: Secondary | ICD-10-CM

## 2013-12-31 DIAGNOSIS — D72829 Elevated white blood cell count, unspecified: Secondary | ICD-10-CM

## 2013-12-31 NOTE — Progress Notes (Signed)
[place], [place] DIAGNOSIS:  Mild pancytopenia with iron deficiency  SUMMARY OF HEMATOLOGIC HISTORY: He was found to have abnormal CBC from routine blood work. I had the opportunity to review his CBC since March of 2009. At that time, the patient had mild thrombocytopenia with a platelet count of 133,000. Starting July of 2014, the patient was noted to have leukopenia, anemia and thrombocytopenia. His white blood cell count was 3, hemoglobin 12.2 and platelet count of 113,000. That was repeated in August 2014 and the anemia resolved but the patient had persistent leukopenia and thrombocytopenia. Recently, on 12/09/2013, blood test show a white count of 3, hemoglobin 12.1 and platelet count of 132,000. Serum chemistries, thyroid function test were within normal limits. His last B12 and folate level from October 2013 were normal. He is on antiplatelets agents for prevention against heart disease. Further work-up revealed mild iron deficiency. INTERVAL HISTORY: Dustin Hess 68 y.o. male returns for return visit. He denies any complaints.  I have reviewed the past medical history, past surgical history, social history and family history with the patient and they are unchanged from previous note.  ALLERGIES:  is allergic to benadryl.  MEDICATIONS:  Current Outpatient Prescriptions  Medication Sig Dispense Refill  . acetaminophen (ARTHRITIS PAIN RELIEVER) 650 MG CR tablet Take 650 mg by mouth every 8 (eight) hours as needed for pain.      Marland Kitchen aspirin 81 MG tablet Take 81 mg by mouth daily.        Marland Kitchen atorvastatin (LIPITOR) 20 MG tablet TAKE ONE (1) TABLET EACH DAY  30 tablet  11  . Cholecalciferol (VITAMIN D3) 1000 UNITS CAPS Take 1 capsule by mouth daily.        Marland Kitchen dutasteride (AVODART) 0.5 MG capsule Take 0.5 mg by mouth daily.        . isosorbide mononitrate (IMDUR) 120 MG 24 hr tablet TAKE 1 TABLET BY MOUTH EVERY DAY  30 tablet  11  .  levothyroxine (SYNTHROID, LEVOTHROID) 100 MCG tablet Take 100 mcg by mouth daily.        Marland Kitchen losartan (COZAAR) 50 MG tablet Take 1 tablet (50 mg total) by mouth daily.  90 tablet  3  . metoprolol succinate (TOPROL-XL) 100 MG 24 hr tablet Take 1 tablet (100 mg total) by mouth daily. Take with or immediately following a meal.  30 tablet  5  . nitroGLYCERIN (NITROSTAT) 0.4 MG SL tablet Place 1 tablet (0.4 mg total) under the tongue every 5 (five) minutes as needed.  25 tablet  5  . omeprazole (PRILOSEC) 20 MG capsule Take 20 mg by mouth daily.        . Tamsulosin HCl (FLOMAX) 0.4 MG CAPS Take 0.4 mg by mouth daily.         No current facility-administered medications for this visit.     REVIEW OF SYSTEMS:   All other systems were reviewed with the patient and are negative.  PHYSICAL EXAMINATION: ECOG PERFORMANCE STATUS: 0 - Asymptomatic  Filed Vitals:   12/31/13 1507  BP: 117/65  Pulse: 75  Temp: 98.9 F (37.2 C)  Resp: 20   Filed Weights   12/31/13 1507  Weight: 166 lb (75.297 kg)    GENERAL:alert, no distress and comfortable NEURO: alert & oriented x 3 with fluent speech, no focal motor/sensory deficits  LABORATORY DATA:  I have reviewed the data as listed No results found for this or any previous visit (from  the past 48 hour(s)).  Lab Results  Component Value Date   WBC 3.5* 12/22/2013   HGB 12.3* 12/22/2013   HCT 38.5 12/22/2013   MCV 87.3 12/22/2013   PLT 125 Few Large platelets present* 12/22/2013   ASSESSMENT & PLAN:  #1 Mild iron deficiency anemia I suspect he may have slight iron deficiency anemia from Gi loss. He is on chronic antiplatelet agents. I recommend iron supplementation and recheck in 3 months #2 Mild leukopenia #3 Mild thrombocytopenia Case is unknown. He is not symptomatic. I recommend observation and recheck in 3 months. If it gets worse, I will order additional work-up then including a bone marrow biopsy All questions were answered. The patient knows to  call the clinic with any problems, questions or concerns. No barriers to learning was detected.  I spent 15 minutes counseling the patient face to face. The total time spent in the appointment was 20 minutes and more than 50% was on counseling.     [facility], [place], [place] 12/31/2013 9:02 PM

## 2014-01-03 ENCOUNTER — Telehealth: Payer: Self-pay | Admitting: Hematology and Oncology

## 2014-01-03 NOTE — Telephone Encounter (Signed)
s.w. pt wife adn advised on July appts...Marland Kitchen.ok and aware

## 2014-04-07 ENCOUNTER — Ambulatory Visit (HOSPITAL_BASED_OUTPATIENT_CLINIC_OR_DEPARTMENT_OTHER): Payer: Medicare Other | Admitting: Hematology and Oncology

## 2014-04-07 ENCOUNTER — Other Ambulatory Visit (HOSPITAL_BASED_OUTPATIENT_CLINIC_OR_DEPARTMENT_OTHER): Payer: Medicare Other

## 2014-04-07 VITALS — BP 130/69 | HR 76 | Temp 97.2°F | Resp 18 | Ht 65.0 in | Wt 167.2 lb

## 2014-04-07 DIAGNOSIS — D696 Thrombocytopenia, unspecified: Secondary | ICD-10-CM

## 2014-04-07 DIAGNOSIS — D509 Iron deficiency anemia, unspecified: Secondary | ICD-10-CM

## 2014-04-07 DIAGNOSIS — D649 Anemia, unspecified: Secondary | ICD-10-CM

## 2014-04-07 DIAGNOSIS — D72819 Decreased white blood cell count, unspecified: Secondary | ICD-10-CM

## 2014-04-07 LAB — CBC & DIFF AND RETIC
BASO%: 0.5 % (ref 0.0–2.0)
BASOS ABS: 0 10*3/uL (ref 0.0–0.1)
EOS ABS: 0.1 10*3/uL (ref 0.0–0.5)
EOS%: 1.9 % (ref 0.0–7.0)
HEMATOCRIT: 41.4 % (ref 38.4–49.9)
HEMOGLOBIN: 13.5 g/dL (ref 13.0–17.1)
Immature Retic Fract: 10 % (ref 3.00–10.60)
LYMPH%: 12.3 % — AB (ref 14.0–49.0)
MCH: 30.6 pg (ref 27.2–33.4)
MCHC: 32.6 g/dL (ref 32.0–36.0)
MCV: 93.9 fL (ref 79.3–98.0)
MONO#: 0.4 10*3/uL (ref 0.1–0.9)
MONO%: 10.9 % (ref 0.0–14.0)
NEUT%: 74.4 % (ref 39.0–75.0)
NEUTROS ABS: 2.7 10*3/uL (ref 1.5–6.5)
PLATELETS: 121 10*3/uL — AB (ref 140–400)
RBC: 4.41 10*6/uL (ref 4.20–5.82)
RDW: 14.3 % (ref 11.0–14.6)
Retic %: 2.66 % — ABNORMAL HIGH (ref 0.80–1.80)
Retic Ct Abs: 117.31 10*3/uL — ABNORMAL HIGH (ref 34.80–93.90)
WBC: 3.7 10*3/uL — ABNORMAL LOW (ref 4.0–10.3)
lymph#: 0.5 10*3/uL — ABNORMAL LOW (ref 0.9–3.3)

## 2014-04-07 LAB — COMPREHENSIVE METABOLIC PANEL (CC13)
ALBUMIN: 3.8 g/dL (ref 3.5–5.0)
ALT: 21 U/L (ref 0–55)
AST: 19 U/L (ref 5–34)
Alkaline Phosphatase: 60 U/L (ref 40–150)
Anion Gap: 9 mEq/L (ref 3–11)
BUN: 19.1 mg/dL (ref 7.0–26.0)
CALCIUM: 9 mg/dL (ref 8.4–10.4)
CHLORIDE: 111 meq/L — AB (ref 98–109)
CO2: 24 mEq/L (ref 22–29)
Creatinine: 1 mg/dL (ref 0.7–1.3)
GLUCOSE: 105 mg/dL (ref 70–140)
POTASSIUM: 4.3 meq/L (ref 3.5–5.1)
Sodium: 143 mEq/L (ref 136–145)
TOTAL PROTEIN: 7.2 g/dL (ref 6.4–8.3)
Total Bilirubin: 0.92 mg/dL (ref 0.20–1.20)

## 2014-04-07 LAB — FERRITIN CHCC: FERRITIN: 86 ng/mL (ref 22–316)

## 2014-04-07 LAB — VITAMIN B12: Vitamin B-12: 411 pg/mL (ref 211–911)

## 2014-04-07 LAB — MORPHOLOGY: PLT EST: DECREASED

## 2014-04-09 ENCOUNTER — Encounter: Payer: Self-pay | Admitting: Hematology and Oncology

## 2014-04-09 NOTE — Assessment & Plan Note (Signed)
I suspect this could be due to sequestration. He had chronic history of fatty liver disease which sometimes can cause mild splenomegaly and mild leukopenia and thrombocytopenia. He is not symptomatic. I recommend observation only.

## 2014-04-09 NOTE — Progress Notes (Signed)
[facility] OFFICE PROGRESS NOTE  Laurena Slimmer, MD DIAGNOSIS:  Mild chronic leukopenia and thrombocytopenia  SUMMARY OF HEMATOLOGIC HISTORY: He was found to have abnormal CBC from routine blood work. I had the opportunity to review his CBC since March of 2009. At that time, the patient had mild thrombocytopenia with a platelet count of 133,000. Starting July of 2014, the patient was noted to have leukopenia, anemia and thrombocytopenia. His white blood cell count was 3, hemoglobin 12.2 and platelet count of 113,000. That was repeated in August 2014 and the anemia resolved but the patient had persistent leukopenia and thrombocytopenia. Recently, on 12/09/2013, blood test show a white count of 3, hemoglobin 12.1 and platelet count of 132,000. Serum chemistries, thyroid function test were within normal limits. His last B12 and folate level from October 2013 were normal. He is on antiplatelets agents for prevention against heart disease. Further work-up revealed mild iron deficiency. His anemia subsequently resolved. INTERVAL HISTORY: Dustin Hess 68 y.o. male returns for further followup. He is doing very well. The patient denies any recent signs or symptoms of bleeding such as spontaneous epistaxis, hematuria or hematochezia. He denies any infections recently.  I have reviewed the past medical history, past surgical history, social history and family history with the patient and they are unchanged from previous note.  ALLERGIES:  is allergic to benadryl.  MEDICATIONS:  Current Outpatient Prescriptions  Medication Sig Dispense Refill  . acetaminophen (ARTHRITIS PAIN RELIEVER) 650 MG CR tablet Take 650 mg by mouth every 8 (eight) hours as needed for pain.      Marland Kitchen aspirin 81 MG tablet Take 81 mg by mouth daily.        Marland Kitchen atorvastatin (LIPITOR) 20 MG tablet TAKE ONE (1) TABLET EACH DAY  30 tablet  11  . Cholecalciferol (VITAMIN D3) 1000 UNITS CAPS Take 1 capsule by mouth  daily.        Marland Kitchen dutasteride (AVODART) 0.5 MG capsule Take 0.5 mg by mouth daily.        . ferrous sulfate 325 (65 FE) MG tablet Take 325 mg by mouth 2 (two) times daily with a meal.      . isosorbide mononitrate (IMDUR) 120 MG 24 hr tablet TAKE 1 TABLET BY MOUTH EVERY DAY  30 tablet  11  . levothyroxine (SYNTHROID, LEVOTHROID) 100 MCG tablet Take 100 mcg by mouth daily.        Marland Kitchen losartan (COZAAR) 50 MG tablet Take 1 tablet (50 mg total) by mouth daily.  90 tablet  3  . metoprolol succinate (TOPROL-XL) 100 MG 24 hr tablet Take 1 tablet (100 mg total) by mouth daily. Take with or immediately following a meal.  30 tablet  5  . Multiple Vitamin (MULTIVITAMIN) tablet Take 1 tablet by mouth daily.      . nitroGLYCERIN (NITROSTAT) 0.4 MG SL tablet Place 1 tablet (0.4 mg total) under the tongue every 5 (five) minutes as needed.  25 tablet  5  . omeprazole (PRILOSEC) 20 MG capsule Take 20 mg by mouth daily.        . Tamsulosin HCl (FLOMAX) 0.4 MG CAPS Take 0.4 mg by mouth daily.         No current facility-administered medications for this visit.     REVIEW OF SYSTEMS:   Constitutional: Denies fevers, chills or night sweats Eyes: Denies blurriness of vision Ears, nose, mouth, throat, and face: Denies mucositis or sore throat Respiratory: Denies cough, dyspnea or wheezes Cardiovascular:  Denies palpitation, chest discomfort or lower extremity swelling Gastrointestinal:  Denies nausea, heartburn or change in bowel habits Skin: Denies abnormal skin rashes Lymphatics: Denies new lymphadenopathy or easy bruising Neurological:Denies numbness, tingling or new weaknesses Behavioral/Psych: Mood is stable, no new changes  All other systems were reviewed with the patient and are negative.  PHYSICAL EXAMINATION: ECOG PERFORMANCE STATUS: 0 - Asymptomatic  Filed Vitals:   04/07/14 1253  BP: 130/69  Pulse: 76  Temp: 97.2 F (36.2 C)  Resp: 18   Filed Weights   04/07/14 1253  Weight: 167 lb 3.2 oz  (75.841 kg)    GENERAL:alert, no distress and comfortable SKIN: skin color, texture, turgor are normal, no rashes or significant lesions Musculoskeletal:no cyanosis of digits and no clubbing  NEURO: alert & oriented x 3 with fluent speech, no focal motor/sensory deficits  LABORATORY DATA:  I have reviewed the data as listed No results found for this or any previous visit (from the past 48 hour(s)).  Lab Results  Component Value Date   WBC 3.7* 04/07/2014   HGB 13.5 04/07/2014   HCT 41.4 04/07/2014   MCV 93.9 04/07/2014   PLT 121* 04/07/2014   ASSESSMENT & PLAN:  Thrombocytopenia, unspecified I suspect this is due to the mild liver disease. He had a CT scan in 2006 which showed diffuse fatty liver infiltration. Assessment stable. I recommend close observation with his primary care provider. He would not benefit from bone marrow aspirate and biopsy. I would be happy to see him back in the future if his platelet count dropped to less than 100,000.  Leukopenia I suspect this could be due to sequestration. He had chronic history of fatty liver disease which sometimes can cause mild splenomegaly and mild leukopenia and thrombocytopenia. He is not symptomatic. I recommend observation only.    All questions were answered. The patient knows to call the clinic with any problems, questions or concerns. No barriers to learning was detected.  I spent 15 minutes counseling the patient face to face. The total time spent in the appointment was 20 minutes and more than 50% was on counseling.     [facility] Dustin [place], MD 04/09/2014 5:19 PM

## 2014-04-09 NOTE — Assessment & Plan Note (Signed)
I suspect this is due to the mild liver disease. He had a CT scan in 2006 which showed diffuse fatty liver infiltration. Assessment stable. I recommend close observation with his primary care provider. He would not benefit from bone marrow aspirate and biopsy. I would be happy to see him back in the future if his platelet count dropped to less than 100,000.

## 2014-05-24 ENCOUNTER — Other Ambulatory Visit: Payer: Self-pay

## 2014-05-24 ENCOUNTER — Other Ambulatory Visit: Payer: Self-pay | Admitting: Nurse Practitioner

## 2014-05-24 DIAGNOSIS — I10 Essential (primary) hypertension: Secondary | ICD-10-CM

## 2014-05-24 MED ORDER — LOSARTAN POTASSIUM 50 MG PO TABS: 50.0000 mg | ORAL_TABLET | Freq: Every day | ORAL | Status: DC

## 2014-06-24 ENCOUNTER — Other Ambulatory Visit (HOSPITAL_COMMUNITY): Payer: Self-pay | Admitting: Internal Medicine

## 2014-06-24 ENCOUNTER — Ambulatory Visit (HOSPITAL_COMMUNITY)
Admission: RE | Admit: 2014-06-24 | Discharge: 2014-06-24 | Disposition: A | Discharge status: Home / Self Care | Payer: Medicare Other | Source: Ambulatory Visit | Attending: Internal Medicine | Admitting: Internal Medicine

## 2014-06-24 DIAGNOSIS — M79609 Pain in unspecified limb: Secondary | ICD-10-CM | POA: Insufficient documentation

## 2014-06-24 DIAGNOSIS — I739 Peripheral vascular disease, unspecified: Secondary | ICD-10-CM | POA: Diagnosis not present

## 2014-06-24 NOTE — Progress Notes (Signed)
VASCULAR LAB PRELIMINARY   ABI completed: bilateral ABI within normal limits.    RIGHT    LEFT    PRESSURE WAVEFORM  PRESSURE WAVEFORM  BRACHIAL 151 Tri BRACHIAL 142 Tri  DP   DP    AT 165 Bi AT 150 Bi  PT 169 Tri PT 167 Tri  PER   PER    GREAT TOE  NA GREAT TOE  NA    RIGHT LEFT  ABI 1.12 1.11    Farrel Demark, RDMS, RVT  06/24/2014, 11:52 AM

## 2014-09-08 ENCOUNTER — Encounter (HOSPITAL_COMMUNITY): Payer: Self-pay | Admitting: Cardiovascular Disease

## 2014-10-26 ENCOUNTER — Other Ambulatory Visit: Payer: Self-pay | Admitting: Cardiology

## 2014-10-28 ENCOUNTER — Other Ambulatory Visit: Payer: Self-pay | Admitting: Cardiology

## 2014-11-25 DIAGNOSIS — R351 Nocturia: Secondary | ICD-10-CM | POA: Diagnosis not present

## 2014-11-25 DIAGNOSIS — N401 Enlarged prostate with lower urinary tract symptoms: Secondary | ICD-10-CM | POA: Diagnosis not present

## 2014-11-25 DIAGNOSIS — R3915 Urgency of urination: Secondary | ICD-10-CM | POA: Diagnosis not present

## 2014-12-01 DIAGNOSIS — I10 Essential (primary) hypertension: Secondary | ICD-10-CM | POA: Diagnosis not present

## 2014-12-01 DIAGNOSIS — E039 Hypothyroidism, unspecified: Secondary | ICD-10-CM | POA: Diagnosis not present

## 2014-12-01 DIAGNOSIS — E780 Pure hypercholesterolemia: Secondary | ICD-10-CM | POA: Diagnosis not present

## 2014-12-21 ENCOUNTER — Other Ambulatory Visit: Payer: Self-pay | Admitting: Cardiology

## 2014-12-22 ENCOUNTER — Other Ambulatory Visit: Payer: Self-pay

## 2014-12-22 MED ORDER — ATORVASTATIN CALCIUM 20 MG PO TABS: 20.0000 mg | ORAL_TABLET | Freq: Every day | ORAL | Status: DC

## 2014-12-22 MED ORDER — ISOSORBIDE MONONITRATE ER 120 MG PO TB24: 120.0000 mg | ORAL_TABLET | Freq: Every day | ORAL | Status: DC

## 2015-01-16 ENCOUNTER — Other Ambulatory Visit: Payer: Self-pay | Admitting: Cardiology

## 2015-01-18 ENCOUNTER — Other Ambulatory Visit: Payer: Self-pay | Admitting: Cardiology

## 2015-01-30 ENCOUNTER — Other Ambulatory Visit: Payer: Self-pay | Admitting: Cardiology

## 2015-02-06 ENCOUNTER — Other Ambulatory Visit: Payer: Self-pay | Admitting: Cardiology

## 2015-02-07 NOTE — Telephone Encounter (Signed)
Please advise on refill. Patient was last give seven tablets and has still failed to schedule an appointment. Thanks, MI

## 2015-02-13 ENCOUNTER — Other Ambulatory Visit: Payer: Self-pay | Admitting: Cardiology

## 2015-02-13 NOTE — Telephone Encounter (Signed)
Rx has been sent to the pharmacy electronically. ° °

## 2015-02-14 ENCOUNTER — Other Ambulatory Visit: Payer: Self-pay | Admitting: *Deleted

## 2015-02-14 MED ORDER — ISOSORBIDE MONONITRATE ER 120 MG PO TB24: ORAL_TABLET | ORAL | Status: DC

## 2015-03-24 ENCOUNTER — Encounter: Payer: Self-pay | Admitting: Cardiology

## 2015-03-24 ENCOUNTER — Ambulatory Visit (INDEPENDENT_AMBULATORY_CARE_PROVIDER_SITE_OTHER): Payer: Medicare Other | Admitting: Cardiology

## 2015-03-24 VITALS — BP 112/70 | HR 86 | Ht 65.0 in | Wt 168.8 lb

## 2015-03-24 DIAGNOSIS — I10 Essential (primary) hypertension: Secondary | ICD-10-CM

## 2015-03-24 MED ORDER — ISOSORBIDE MONONITRATE ER 120 MG PO TB24: 120.0000 mg | ORAL_TABLET | Freq: Every day | ORAL | Status: DC

## 2015-03-24 MED ORDER — ATORVASTATIN CALCIUM 20 MG PO TABS: 20.0000 mg | ORAL_TABLET | Freq: Every day | ORAL | Status: DC

## 2015-03-24 MED ORDER — NITROGLYCERIN 0.4 MG SL SUBL: 0.4000 mg | SUBLINGUAL_TABLET | SUBLINGUAL | Status: DC | PRN

## 2015-03-24 MED ORDER — METOPROLOL SUCCINATE ER 100 MG PO TB24: 100.0000 mg | ORAL_TABLET | Freq: Every day | ORAL | Status: DC

## 2015-03-24 NOTE — Patient Instructions (Signed)
Your physician wants you to follow-up in: 6 Months with Dr Hochrein. You will receive a reminder letter in the mail two months in advance. If you don't receive a letter, please call our office to schedule the follow-up appointment.  

## 2015-03-24 NOTE — Progress Notes (Signed)
03/24/2015 Dustin Hess   August 14, 1946  [id number]  Primary Physician Laurena Slimmer, MD Primary Cardiologist: Dr. Antoine Poche  Reason for Visit/CC: F/u for CAD and HTN  HPI:  The patient is a 69 y/o male, followed by Dr. Antoine Poche, who presents to clinic today for f/u. He has a h/o CAD. He had a STEMI in 2005, s/p stenting to his RCA and LCx. Repeat LHC in 2009 showed patent stents and only mild luminal irregularities in the LAD. LVF was noramal. In 2012, he had a NST that showed no ischemia and normal LVF function. EF 65%. He also has a h/o HTN.  Today in follow-up, he reports that he has done well. He denies any chest pain, dyspnea, syncope/ near syncope, orthopnea, PND, LEE or palpitations. No exertional symptoms/limitation. He reports full medication compliance. Denies tobacco use.   EKG today demonstrates NSR w/o ischemic abnormalities. HR and BP well controlled.    Current Outpatient Prescriptions  Medication Sig Dispense Refill  . acetaminophen (ARTHRITIS PAIN RELIEVER) 650 MG CR tablet Take 650 mg by mouth every 8 (eight) hours as needed for pain.    Marland Kitchen aspirin 81 MG tablet Take 81 mg by mouth daily.      Marland Kitchen atorvastatin (LIPITOR) 20 MG tablet TAKE 1 TABLET (20 MG TOTAL) BY MOUTH DAILY. 7 tablet 0  . Cholecalciferol (VITAMIN D3) 1000 UNITS CAPS Take 1 capsule by mouth daily.      Marland Kitchen dutasteride (AVODART) 0.5 MG capsule Take 0.5 mg by mouth daily.      . ferrous sulfate 325 (65 FE) MG tablet Take 325 mg by mouth 2 (two) times daily with a meal.    . isosorbide mononitrate (IMDUR) 120 MG 24 hr tablet TAKE 1 TABLET MOUTH DAILY. PT NEEDS TO CONTACT KEEP APPOINTMENT FOR FURTHER REFILLS 30 tablet 1  . levothyroxine (SYNTHROID, LEVOTHROID) 100 MCG tablet Take 100 mcg by mouth daily.      Marland Kitchen losartan (COZAAR) 50 MG tablet Take 1 tablet (50 mg total) by mouth daily. 90 tablet 3  . metoprolol succinate (TOPROL-XL) 100 MG 24 hr tablet Take 1 tablet (100 mg total) by mouth daily. Take  with or immediately following a meal. 30 tablet 5  . Multiple Vitamin (MULTIVITAMIN) tablet Take 1 tablet by mouth daily.    . nitroGLYCERIN (NITROSTAT) 0.4 MG SL tablet Place 1 tablet (0.4 mg total) under the tongue every 5 (five) minutes as needed. 25 tablet 5  . omeprazole (PRILOSEC) 20 MG capsule Take 20 mg by mouth daily.      . Tamsulosin HCl (FLOMAX) 0.4 MG CAPS Take 0.4 mg by mouth daily.       No current facility-administered medications for this visit.    Allergies  Allergen Reactions  . Benadryl [Diphenhydramine Hcl] Palpitations    2014  Took one tablet and had heart palpitations    History   Social History  . Marital Status: Married    Spouse Name: N/A  . Number of Children: N/A  . Years of Education: N/A   Occupational History  . Not on file.   Social History Main Topics  . Smoking status: Never Smoker   . Smokeless tobacco: Never Used  . Alcohol Use: No  . Drug Use: No  . Sexual Activity: Not on file   Other Topics Concern  . Not on file   Social History Narrative   Lives in [place] with your wife and dtr.  Works on [employer] for  the [employer].  Does not routinely exercise.     Review of Systems: General: negative for chills, fever, night sweats or weight changes.  Cardiovascular: negative for chest pain, dyspnea on exertion, edema, orthopnea, palpitations, paroxysmal nocturnal dyspnea or shortness of breath Dermatological: negative for rash Respiratory: negative for cough or wheezing Urologic: negative for hematuria Abdominal: negative for nausea, vomiting, diarrhea, bright red blood per rectum, melena, or hematemesis Neurologic: negative for visual changes, syncope, or dizziness All other systems reviewed and are otherwise negative except as noted above.    Blood pressure 112/70, pulse 86, height  (1.651 m), weight 168 lb 12.8 oz (76.567 kg).  General appearance: alert, cooperative and no distress Neck: no carotid bruit and no  JVD Lungs: clear to auscultation bilaterally Heart: regular rate and rhythm, S1, S2 normal, no murmur, click, rub or gallop Extremities: no LEE Pulses: 2+ and symmetric Skin: warm and dry Neurologic: Grossly normal  EKG NSR. No ischemia. HR 86 bpm  ASSESSMENT AND PLAN:   1. CAD: stable w/o angina and no exertional symptoms. EKG nonischemic. Continue medical therapy with ASA, statin BB and ARB. HR and BP stable. No adjustments. Refills provided.  2. HTN: BP well controlled on current medications. No adjustments. Refills provided.   PLAN  Stable from a CV standpoint. F/u with Dr. Antoine Poche in 6 months.    Robbie Lis PA-C 03/24/2015 4:16 PM

## 2015-04-05 DIAGNOSIS — Z0000 Encounter for general adult medical examination without abnormal findings: Secondary | ICD-10-CM | POA: Diagnosis not present

## 2015-04-12 ENCOUNTER — Other Ambulatory Visit: Payer: Self-pay | Admitting: Cardiology

## 2015-04-13 ENCOUNTER — Other Ambulatory Visit: Payer: Self-pay | Admitting: Cardiology

## 2015-04-13 NOTE — Telephone Encounter (Signed)
Rx has been sent to the pharmacy electronically. ° °

## 2015-07-18 DIAGNOSIS — I25710 Atherosclerosis of autologous vein coronary artery bypass graft(s) with unstable angina pectoris: Secondary | ICD-10-CM | POA: Diagnosis not present

## 2015-07-18 DIAGNOSIS — E559 Vitamin D deficiency, unspecified: Secondary | ICD-10-CM | POA: Diagnosis not present

## 2015-07-18 DIAGNOSIS — R799 Abnormal finding of blood chemistry, unspecified: Secondary | ICD-10-CM | POA: Diagnosis not present

## 2015-07-18 DIAGNOSIS — I10 Essential (primary) hypertension: Secondary | ICD-10-CM | POA: Diagnosis not present

## 2015-07-18 DIAGNOSIS — E781 Pure hyperglyceridemia: Secondary | ICD-10-CM | POA: Diagnosis not present

## 2015-07-18 DIAGNOSIS — I2510 Atherosclerotic heart disease of native coronary artery without angina pectoris: Secondary | ICD-10-CM | POA: Diagnosis not present

## 2015-07-18 DIAGNOSIS — D075 Carcinoma in situ of prostate: Secondary | ICD-10-CM | POA: Diagnosis not present

## 2015-07-18 DIAGNOSIS — Z23 Encounter for immunization: Secondary | ICD-10-CM | POA: Diagnosis not present

## 2015-07-18 DIAGNOSIS — E039 Hypothyroidism, unspecified: Secondary | ICD-10-CM | POA: Diagnosis not present

## 2015-07-24 ENCOUNTER — Other Ambulatory Visit: Payer: Self-pay | Admitting: *Deleted

## 2015-07-24 DIAGNOSIS — I10 Essential (primary) hypertension: Secondary | ICD-10-CM

## 2015-07-24 MED ORDER — LOSARTAN POTASSIUM 50 MG PO TABS: 50.0000 mg | ORAL_TABLET | Freq: Every day | ORAL | Status: DC

## 2015-11-07 ENCOUNTER — Ambulatory Visit (INDEPENDENT_AMBULATORY_CARE_PROVIDER_SITE_OTHER): Payer: Medicare Other | Admitting: Cardiology

## 2015-11-07 ENCOUNTER — Encounter: Payer: Self-pay | Admitting: Cardiology

## 2015-11-07 VITALS — BP 102/56 | HR 80 | Ht 65.0 in | Wt 166.0 lb

## 2015-11-07 DIAGNOSIS — I2510 Atherosclerotic heart disease of native coronary artery without angina pectoris: Secondary | ICD-10-CM | POA: Diagnosis not present

## 2015-11-07 NOTE — Progress Notes (Signed)
HPI The patient presents for followup of his known coronary disease.    Since I last saw him he has done well.  The patient denies any new symptoms such as chest discomfort, neck or arm discomfort. There has been no new shortness of breath, PND or orthopnea. There have been no reported palpitations, presyncope or syncope.  He has a chronic stable pattern of rare chest pain.  He has taken NTG twice he thinks in the last two months for vague symptoms.  He still works and has no symptoms with this.     Allergies  Allergen Reactions  . Benadryl [Diphenhydramine Hcl] Palpitations    2014  Took one tablet and had heart palpitations    Current Outpatient Prescriptions  Medication Sig Dispense Refill  . acetaminophen (ARTHRITIS PAIN RELIEVER) 650 MG CR tablet Take 650 mg by mouth every 8 (eight) hours as needed for pain.    Marland Kitchen aspirin 81 MG tablet Take 81 mg by mouth daily.      Marland Kitchen atorvastatin (LIPITOR) 20 MG tablet Take 1 tablet (20 mg total) by mouth daily at 6 PM. 30 tablet 6  . Cholecalciferol (VITAMIN D3) 1000 UNITS CAPS Take 1 capsule by mouth daily.      Marland Kitchen dutasteride (AVODART) 0.5 MG capsule Take 0.5 mg by mouth daily.      . ferrous sulfate 325 (65 FE) MG tablet Take 325 mg by mouth 2 (two) times daily with a meal.    . isosorbide mononitrate (IMDUR) 120 MG 24 hr tablet Take 1 tablet (120 mg total) by mouth daily. 30 tablet 6  . levothyroxine (SYNTHROID, LEVOTHROID) 100 MCG tablet Take 100 mcg by mouth daily.      Marland Kitchen losartan (COZAAR) 50 MG tablet Take 1 tablet (50 mg total) by mouth daily. 90 tablet 2  . metoprolol succinate (TOPROL-XL) 100 MG 24 hr tablet Take 1 tablet (100 mg total) by mouth daily. Take with or immediately following a meal. 30 tablet 6  . Multiple Vitamin (MULTIVITAMIN) tablet Take 1 tablet by mouth daily.    . nitroGLYCERIN (NITROSTAT) 0.4 MG SL tablet Place 1 tablet (0.4 mg total) under the tongue every 5 (five) minutes as needed. 25 tablet 2  . omeprazole (PRILOSEC)  20 MG capsule Take 20 mg by mouth daily.      . Tamsulosin HCl (FLOMAX) 0.4 MG CAPS Take 0.4 mg by mouth daily.       No current facility-administered medications for this visit.    Past Medical History  Diagnosis Date  . Coronary artery disease     a. 06/2004 Inf STEMI with PCI/BMS to RCA/LCX;  b. Cath June 2009, demonstrate left mild calcification, LAD had luminal irregularities, 60% stenosis in the distal portion, circumflex had a stent in the AV groove which was patent. The RCA had a mid stent which was patent. EF 60%. THere was posterolateral branch with 70% stenosis that was narrow in caliber. The EF of 65%;  c. 06/2011 Neg MV, EF 67%.  . Hypothyroidism   . GI bleeding 2006  . Congenital blindness   . Hypertension   . Dyslipidemia   . Benign prostatic hypertrophy   . GERD (gastroesophageal reflux disease)   . Arthritis   . Leukopenia 12/22/2013  . Anemia, unspecified 12/22/2013    Past Surgical History  Procedure Laterality Date  . Esophagogastroduodenoscopy    . Colonoscopy    . Hemorrhoid surgery    . Coronary angioplasty with stent placement    .  Left heart catheterization with coronary angiogram N/A 04/30/2013    Procedure: LEFT HEART CATHETERIZATION WITH CORONARY ANGIOGRAM;  Surgeon: Tonny Bollman, MD;  Location: [facility] CATH LAB;  Service: Cardiovascular;  Laterality: N/A;    ROS: Blind.  Otherwise as stated in the HPI and negative for all other systems.  PHYSICAL EXAM BP 102/56 mmHg  Pulse 80  Ht  (1.651 m)  Wt 166 lb (75.297 kg)  BMI 27.62 kg/m2 GENERAL:  Well appearing HEENT:  Pupils nonreactive, fundi not visualized, lens opacification, oral mucosa unremarkable NECK:  No jugular venous distention, waveform within normal limits, carotid upstroke brisk and symmetric, no bruits, no thyromegaly LUNGS:  Clear to auscultation bilaterally CHEST:  Unremarkable HEART:  PMI not displaced or sustained,S1 and S2 within normal limits, no S3, no S4, no clicks, no rubs, no  murmurs ABD:  Flat, positive bowel sounds normal in frequency in pitch, no bruits, no rebound, no guarding, no midline pulsatile mass, no hepatomegaly, no splenomegaly EXT:  2 plus pulses throughout, no edema, no cyanosis no clubbing    ASSESSMENT AND PLAN   CAD -  The patient has no new sypmtoms.  No further cardiovascular testing is indicated.  We will continue with aggressive risk reduction and meds as listed.  ESSENTIAL HYPERTENSION, BENIGN -  The blood pressure is at target. No change in medications is indicated. We will continue with therapeutic lifestyle changes (TLC).  HYPERLIPIDEMIA -  Per Laurena Slimmer, MD

## 2015-11-07 NOTE — Patient Instructions (Signed)
Your physician wants you to follow-up in: 1 year or sooner if needed. You will receive a reminder letter in the mail two months in advance. If you don't receive a letter, please call our office to schedule the follow-up appointment.   If you need a refill on your cardiac medications before your next appointment, please call your pharmacy.   

## 2015-11-08 ENCOUNTER — Encounter: Payer: Self-pay | Admitting: Cardiology

## 2015-11-08 ENCOUNTER — Other Ambulatory Visit: Payer: Self-pay | Admitting: *Deleted

## 2015-11-08 DIAGNOSIS — I25810 Atherosclerosis of coronary artery bypass graft(s) without angina pectoris: Secondary | ICD-10-CM | POA: Insufficient documentation

## 2015-11-08 MED ORDER — ISOSORBIDE MONONITRATE ER 120 MG PO TB24: 120.0000 mg | ORAL_TABLET | Freq: Every day | ORAL | Status: DC

## 2015-11-23 DIAGNOSIS — B351 Tinea unguium: Secondary | ICD-10-CM | POA: Diagnosis not present

## 2015-11-23 DIAGNOSIS — E039 Hypothyroidism, unspecified: Secondary | ICD-10-CM | POA: Diagnosis not present

## 2015-11-23 DIAGNOSIS — E7800 Pure hypercholesterolemia, unspecified: Secondary | ICD-10-CM | POA: Diagnosis not present

## 2015-11-23 DIAGNOSIS — I10 Essential (primary) hypertension: Secondary | ICD-10-CM | POA: Diagnosis not present

## 2015-11-27 ENCOUNTER — Other Ambulatory Visit: Payer: Self-pay | Admitting: Cardiology

## 2015-11-27 NOTE — Telephone Encounter (Signed)
REFILL 

## 2016-01-17 DIAGNOSIS — K529 Noninfective gastroenteritis and colitis, unspecified: Secondary | ICD-10-CM | POA: Diagnosis not present

## 2016-02-27 DIAGNOSIS — N138 Other obstructive and reflux uropathy: Secondary | ICD-10-CM | POA: Diagnosis not present

## 2016-02-27 DIAGNOSIS — N401 Enlarged prostate with lower urinary tract symptoms: Secondary | ICD-10-CM | POA: Diagnosis not present

## 2016-02-27 DIAGNOSIS — Z0000 Encounter for general adult medical examination without abnormal findings: Secondary | ICD-10-CM | POA: Diagnosis not present

## 2016-04-04 DIAGNOSIS — I25119 Atherosclerotic heart disease of native coronary artery with unspecified angina pectoris: Secondary | ICD-10-CM | POA: Diagnosis not present

## 2016-04-04 DIAGNOSIS — E039 Hypothyroidism, unspecified: Secondary | ICD-10-CM | POA: Diagnosis not present

## 2016-04-04 DIAGNOSIS — I10 Essential (primary) hypertension: Secondary | ICD-10-CM | POA: Diagnosis not present

## 2016-04-04 DIAGNOSIS — K210 Gastro-esophageal reflux disease with esophagitis: Secondary | ICD-10-CM | POA: Diagnosis not present

## 2016-05-06 ENCOUNTER — Other Ambulatory Visit: Payer: Self-pay | Admitting: Cardiology

## 2016-05-06 DIAGNOSIS — I10 Essential (primary) hypertension: Secondary | ICD-10-CM

## 2016-07-16 DIAGNOSIS — H1789 Other corneal scars and opacities: Secondary | ICD-10-CM | POA: Diagnosis not present

## 2016-07-16 DIAGNOSIS — H2702 Aphakia, left eye: Secondary | ICD-10-CM | POA: Diagnosis not present

## 2016-08-08 DIAGNOSIS — R7302 Impaired glucose tolerance (oral): Secondary | ICD-10-CM | POA: Diagnosis not present

## 2016-08-08 DIAGNOSIS — E7800 Pure hypercholesterolemia, unspecified: Secondary | ICD-10-CM | POA: Diagnosis not present

## 2016-08-08 DIAGNOSIS — I10 Essential (primary) hypertension: Secondary | ICD-10-CM | POA: Diagnosis not present

## 2016-08-08 DIAGNOSIS — M2550 Pain in unspecified joint: Secondary | ICD-10-CM | POA: Diagnosis not present

## 2016-08-08 DIAGNOSIS — E039 Hypothyroidism, unspecified: Secondary | ICD-10-CM | POA: Diagnosis not present

## 2016-08-08 DIAGNOSIS — Z125 Encounter for screening for malignant neoplasm of prostate: Secondary | ICD-10-CM | POA: Diagnosis not present

## 2016-08-08 DIAGNOSIS — Z23 Encounter for immunization: Secondary | ICD-10-CM | POA: Diagnosis not present

## 2016-08-28 DIAGNOSIS — E7800 Pure hypercholesterolemia, unspecified: Secondary | ICD-10-CM | POA: Diagnosis not present

## 2016-08-28 DIAGNOSIS — M65221 Calcific tendinitis, right upper arm: Secondary | ICD-10-CM | POA: Diagnosis not present

## 2016-08-28 DIAGNOSIS — I10 Essential (primary) hypertension: Secondary | ICD-10-CM | POA: Diagnosis not present

## 2016-08-28 DIAGNOSIS — E039 Hypothyroidism, unspecified: Secondary | ICD-10-CM | POA: Diagnosis not present

## 2016-09-01 ENCOUNTER — Ambulatory Visit (HOSPITAL_COMMUNITY)
Admission: RE | Admit: 2016-09-01 | Discharge: 2016-09-01 | Disposition: A | Discharge status: Home / Self Care | Payer: Medicare Other | Source: Ambulatory Visit | Attending: Internal Medicine | Admitting: Internal Medicine

## 2016-09-01 ENCOUNTER — Other Ambulatory Visit: Payer: Self-pay | Admitting: Internal Medicine

## 2016-09-01 ENCOUNTER — Ambulatory Visit (HOSPITAL_COMMUNITY)
Admission: EM | Admit: 2016-09-01 | Discharge: 2016-09-01 | Disposition: A | Discharge status: Home / Self Care | Payer: Medicare Other | Source: Intra-hospital | Attending: Internal Medicine | Admitting: Internal Medicine

## 2016-09-01 DIAGNOSIS — R52 Pain, unspecified: Secondary | ICD-10-CM

## 2016-09-01 DIAGNOSIS — M79621 Pain in right upper arm: Secondary | ICD-10-CM | POA: Diagnosis not present

## 2016-10-25 ENCOUNTER — Other Ambulatory Visit: Payer: Self-pay | Admitting: Cardiology

## 2016-12-05 ENCOUNTER — Other Ambulatory Visit: Payer: Self-pay | Admitting: Cardiology

## 2016-12-05 NOTE — Telephone Encounter (Signed)
REFILL 

## 2016-12-11 DIAGNOSIS — E559 Vitamin D deficiency, unspecified: Secondary | ICD-10-CM | POA: Diagnosis not present

## 2016-12-11 DIAGNOSIS — I10 Essential (primary) hypertension: Secondary | ICD-10-CM | POA: Diagnosis not present

## 2016-12-11 DIAGNOSIS — Z125 Encounter for screening for malignant neoplasm of prostate: Secondary | ICD-10-CM | POA: Diagnosis not present

## 2016-12-11 DIAGNOSIS — E119 Type 2 diabetes mellitus without complications: Secondary | ICD-10-CM | POA: Diagnosis not present

## 2016-12-11 DIAGNOSIS — M2550 Pain in unspecified joint: Secondary | ICD-10-CM | POA: Diagnosis not present

## 2016-12-19 ENCOUNTER — Ambulatory Visit: Payer: Self-pay | Admitting: Cardiology

## 2016-12-24 NOTE — Progress Notes (Signed)
HPI The patient presents for followup of his known coronary disease.    Since I last saw him he has done very well.  The patient denies any new symptoms such as chest discomfort, neck or arm discomfort. There has been no new shortness of breath, PND or orthopnea. There have been no reported palpitations, presyncope or syncope.   Allergies  Allergen Reactions  . Benadryl [Diphenhydramine Hcl] Palpitations    2014  Took one tablet and had heart palpitations    Current Outpatient Prescriptions  Medication Sig Dispense Refill  . acetaminophen (ARTHRITIS PAIN RELIEVER) 650 MG CR tablet Take 650 mg by mouth every 8 (eight) hours as needed for pain.    Marland Kitchen aspirin 81 MG tablet Take 81 mg by mouth daily.      Marland Kitchen atorvastatin (LIPITOR) 20 MG tablet TAKE 1 TABLET (20 MG TOTAL) BY MOUTH DAILY AT 6 PM. 30 tablet 11  . Cholecalciferol (VITAMIN D3) 1000 UNITS CAPS Take 1 capsule by mouth daily.      Marland Kitchen dutasteride (AVODART) 0.5 MG capsule Take 0.5 mg by mouth daily.      . ferrous sulfate 325 (65 FE) MG tablet Take 325 mg by mouth 2 (two) times daily with a meal.    . isosorbide mononitrate (IMDUR) 120 MG 24 hr tablet Take 1 tablet (120 mg total) by mouth daily. NEED OV. 15 tablet 0  . levothyroxine (SYNTHROID, LEVOTHROID) 112 MCG tablet Take 112 mcg by mouth daily before breakfast.    . losartan (COZAAR) 50 MG tablet TAKE 1 TABLET BY MOUTH DAILY. 90 tablet 2  . metoprolol succinate (TOPROL-XL) 100 MG 24 hr tablet Take 1 tablet (100 mg total) by mouth daily. Take with or immediately following a meal. 30 tablet 6  . Multiple Vitamin (MULTIVITAMIN) tablet Take 1 tablet by mouth daily.    . nitroGLYCERIN (NITROSTAT) 0.4 MG SL tablet Place 1 tablet (0.4 mg total) under the tongue every 5 (five) minutes as needed. 25 tablet 2  . omeprazole (PRILOSEC) 20 MG capsule Take 20 mg by mouth daily.      . ranitidine (ZANTAC) 150 MG tablet Take 150 mg by mouth 2 (two) times daily as needed.  99  . Tamsulosin HCl  (FLOMAX) 0.4 MG CAPS Take 0.4 mg by mouth daily.       No current facility-administered medications for this visit.     Past Medical History:  Diagnosis Date  . Anemia, unspecified 12/22/2013  . Arthritis   . Benign prostatic hypertrophy   . Congenital blindness   . Coronary artery disease    a. 06/2004 Inf STEMI with PCI/BMS to RCA/LCX;  b. Cath 2104 40-50% focal bradycardia circ stenosis with a patent stent, RCA 50% stenosis, distal 60% stenosis in a previously stented area. 06/2011 Neg MV, EF 67%.  . Dyslipidemia   . GERD (gastroesophageal reflux disease)   . GI bleeding 2006  . Hypertension   . Hypothyroidism   . Leukopenia 12/22/2013    Past Surgical History:  Procedure Laterality Date  . COLONOSCOPY    . CORONARY ANGIOPLASTY WITH STENT PLACEMENT    . ESOPHAGOGASTRODUODENOSCOPY    . HEMORRHOID SURGERY    . LEFT HEART CATHETERIZATION WITH CORONARY ANGIOGRAM N/A 04/30/2013   Procedure: LEFT HEART CATHETERIZATION WITH CORONARY ANGIOGRAM;  Surgeon: Tonny Bollman, MD;  Location: [facility] CATH LAB;  Service: Cardiovascular;  Laterality: N/A;    ROS: Blind.  Otherwise as stated in the HPI and negative for all other systems.  PHYSICAL EXAM BP 126/78   Pulse 76   Ht 5\' 5"  (1.651 m)   Wt 161 lb (73 kg)   BMI 26.79 kg/m  GENERAL:  Well appearing and in no distress  HEENT:  Pupils nonreactive, fundi not visualized, lens opacification, oral mucosa unremarkable NECK:  No jugular venous distention, waveform within normal limits, carotid upstroke brisk and symmetric, no bruits, no thyromegaly LUNGS:  Clear to auscultation bilaterally CHEST:  Unremarkable HEART:  PMI not displaced or sustained,S1 and S2 within normal limits, no S3, no S4, no clicks, no rubs, no murmurs ABD:  Flat, positive bowel sounds normal in frequency in pitch, no bruits, no rebound, no guarding, no midline pulsatile mass, no hepatomegaly, no splenomegaly EXT:  2 plus pulses throughout, no edema, no cyanosis no clubbing     EKG:  Sinus rhythm, rate 76, axis within normal limits, intervals within normal limits, no acute ST-T wave changes.   ASSESSMENT AND PLAN   CAD -   The patient has no new sypmtoms.  No further cardiovascular testing is indicated.  He has not used any NTG recently.   ESSENTIAL HYPERTENSION, BENIGN -  The blood pressure is at target. No change in medications is indicated. We will continue with therapeutic lifestyle changes (TLC).  No change in therapy  HYPERLIPIDEMIA -  Per Laurena SlimmerLARK,PRESTON S, MD

## 2016-12-25 ENCOUNTER — Encounter: Payer: Self-pay | Admitting: Cardiology

## 2016-12-25 ENCOUNTER — Ambulatory Visit (INDEPENDENT_AMBULATORY_CARE_PROVIDER_SITE_OTHER): Payer: Medicare PPO | Admitting: Cardiology

## 2016-12-25 VITALS — BP 126/78 | HR 76 | Ht 65.0 in | Wt 161.0 lb

## 2016-12-25 DIAGNOSIS — I2510 Atherosclerotic heart disease of native coronary artery without angina pectoris: Secondary | ICD-10-CM

## 2016-12-25 DIAGNOSIS — I10 Essential (primary) hypertension: Secondary | ICD-10-CM | POA: Diagnosis not present

## 2016-12-25 MED ORDER — NITROGLYCERIN 0.4 MG SL SUBL: 0.4000 mg | SUBLINGUAL_TABLET | SUBLINGUAL | 2 refills | Status: DC | PRN

## 2016-12-25 NOTE — Patient Instructions (Signed)

## 2017-01-10 ENCOUNTER — Other Ambulatory Visit: Payer: Self-pay | Admitting: Cardiology

## 2017-01-30 IMAGING — CR DG HUMERUS 2V *R*
2 series · 2 of 2 positions shown · non-contrast
Comparison: None.

CLINICAL DATA: Right biceps and elbow pain for 1 month.  No trauma.

EXAM:
RIGHT HUMERUS - 2+ VIEW

[humerus ap]
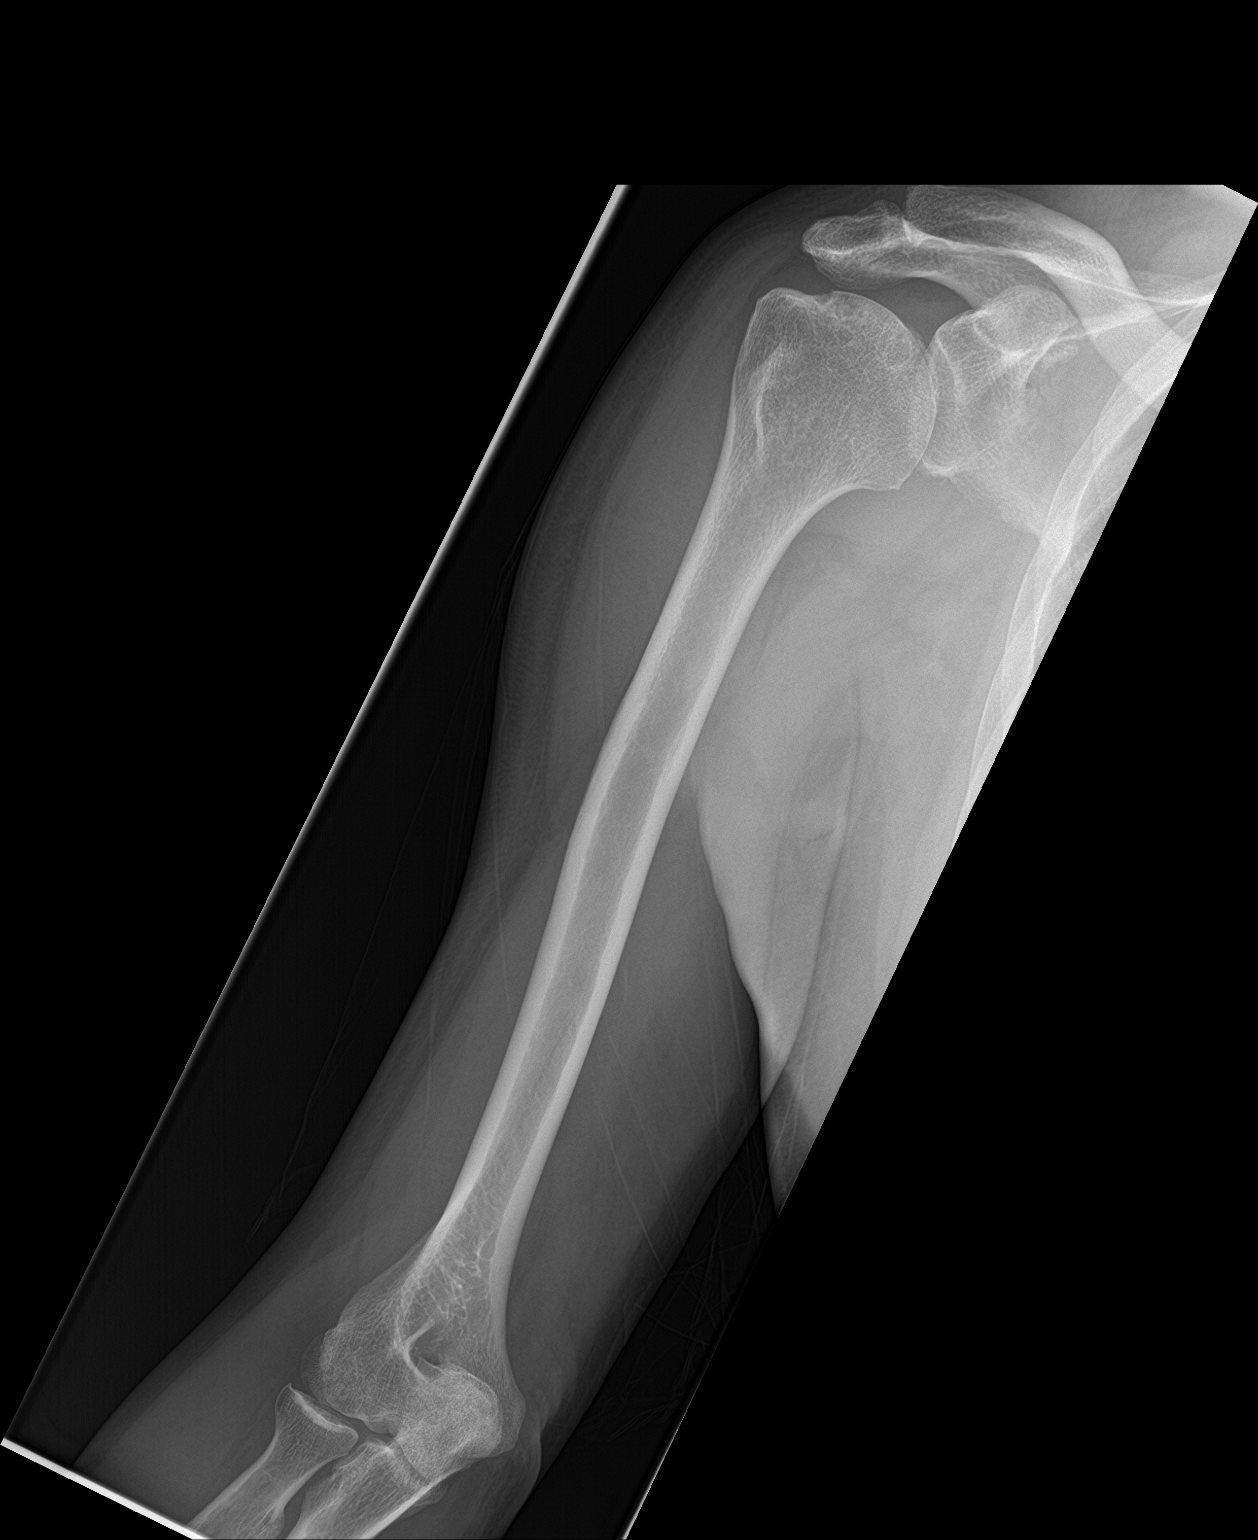

[humerus lat]
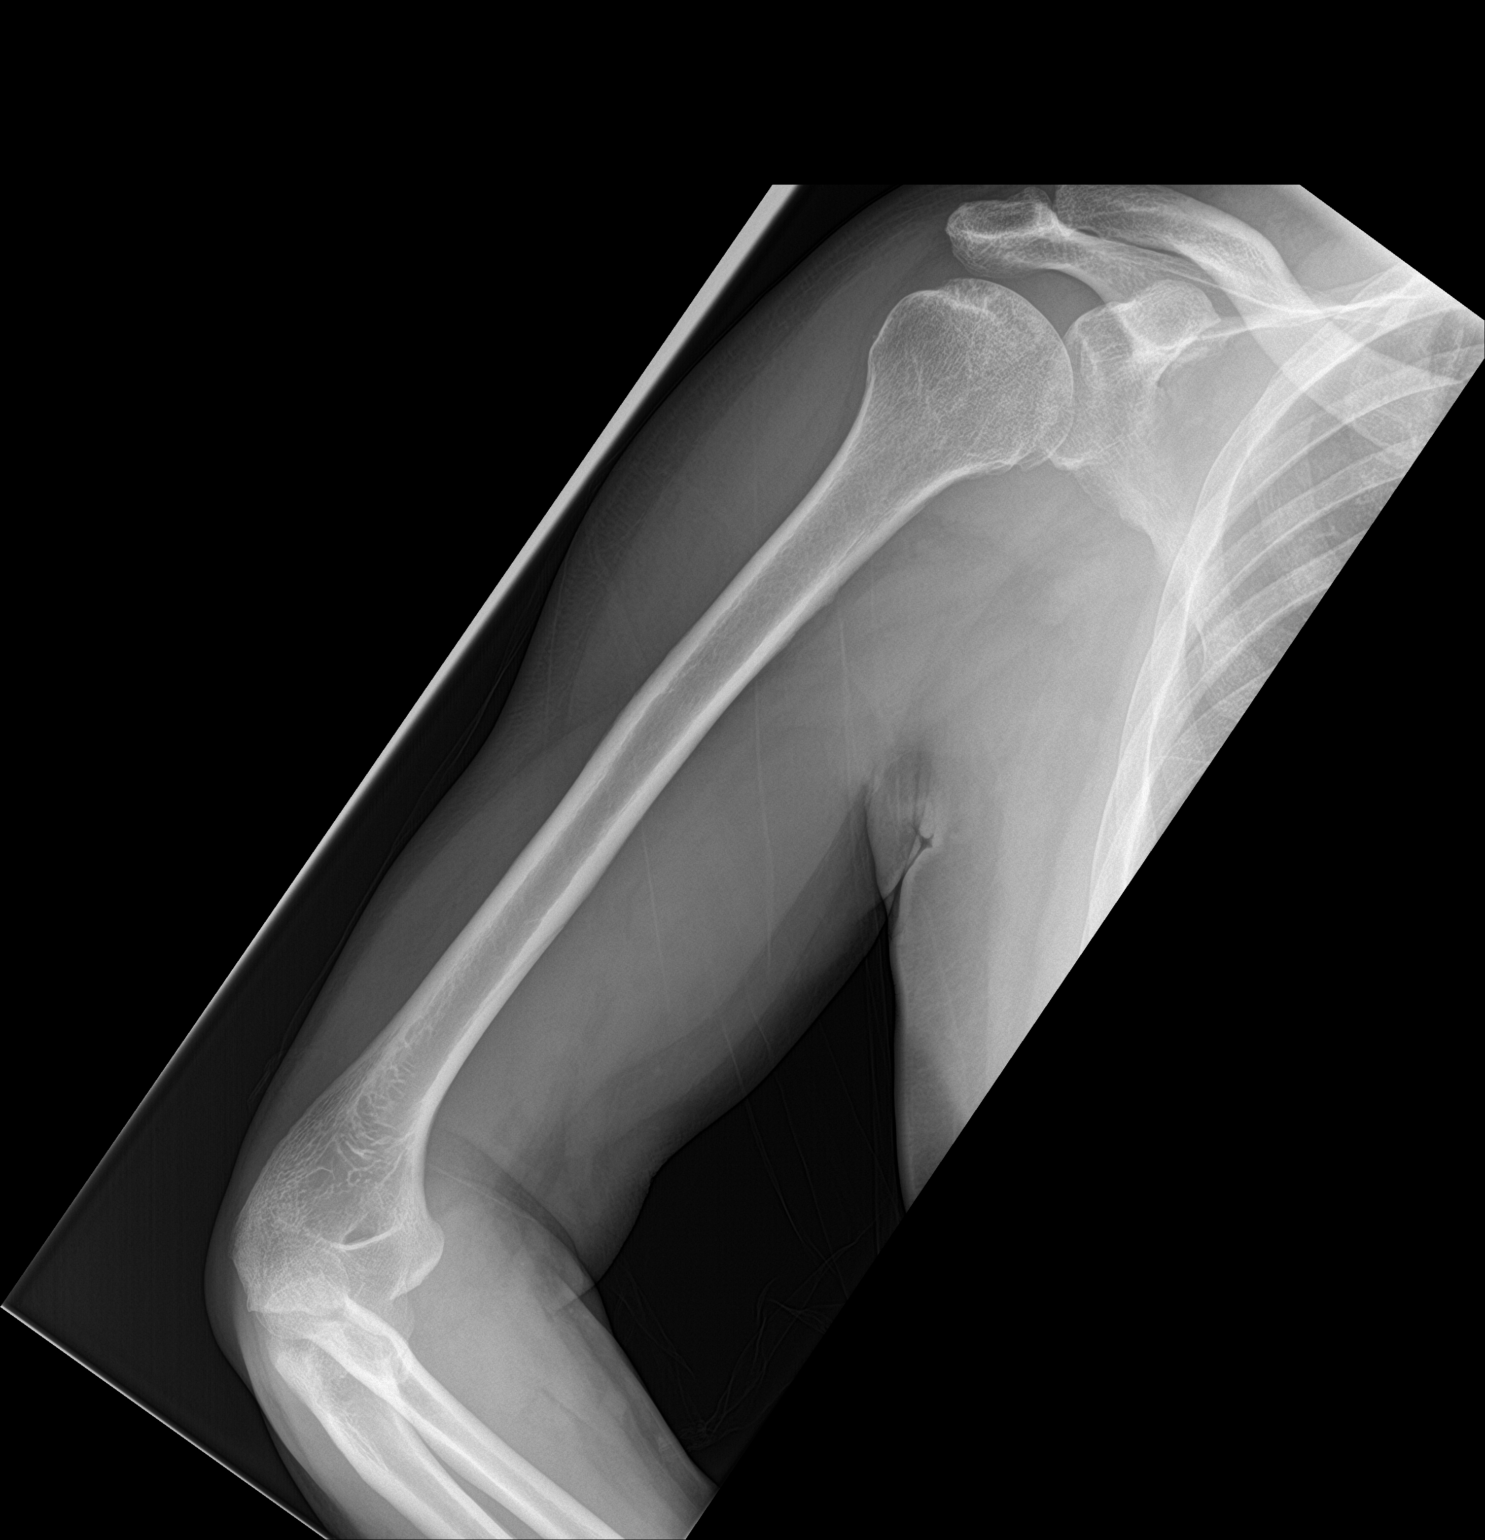

[2 of 2 positions shown; findings below may reference images not displayed]

FINDINGS: There is no evidence of fracture or other focal bone lesions. Soft
tissues are unremarkable.
IMPRESSION: Negative.

## 2017-04-10 DIAGNOSIS — E039 Hypothyroidism, unspecified: Secondary | ICD-10-CM | POA: Diagnosis not present

## 2017-04-10 DIAGNOSIS — Z23 Encounter for immunization: Secondary | ICD-10-CM | POA: Diagnosis not present

## 2017-04-10 DIAGNOSIS — E7800 Pure hypercholesterolemia, unspecified: Secondary | ICD-10-CM | POA: Diagnosis not present

## 2017-04-10 DIAGNOSIS — I10 Essential (primary) hypertension: Secondary | ICD-10-CM | POA: Diagnosis not present

## 2017-04-29 DIAGNOSIS — N401 Enlarged prostate with lower urinary tract symptoms: Secondary | ICD-10-CM | POA: Diagnosis not present

## 2017-04-29 DIAGNOSIS — R351 Nocturia: Secondary | ICD-10-CM | POA: Diagnosis not present

## 2017-05-26 ENCOUNTER — Other Ambulatory Visit: Payer: Self-pay | Admitting: Cardiology

## 2017-05-27 NOTE — Telephone Encounter (Signed)
Rx(s) sent to pharmacy electronically.  

## 2017-11-20 ENCOUNTER — Encounter: Payer: Self-pay | Admitting: Cardiology

## 2017-11-25 NOTE — Progress Notes (Deleted)
HPI The patient presents for followup of his known coronary disease.    Since I last saw him ***   he has done very well.  The patient denies any new symptoms such as chest discomfort, neck or arm discomfort. There has been no new shortness of breath, PND or orthopnea. There have been no reported palpitations, presyncope or syncope.   Allergies  Allergen Reactions  . Benadryl [Diphenhydramine Hcl] Palpitations    2014  Took one tablet and had heart palpitations    Current Outpatient Medications  Medication Sig Dispense Refill  . acetaminophen (ARTHRITIS PAIN RELIEVER) 650 MG CR tablet Take 650 mg by mouth every 8 (eight) hours as needed for pain.    Marland Kitchen. aspirin 81 MG tablet Take 81 mg by mouth daily.      Marland Kitchen. atorvastatin (LIPITOR) 20 MG tablet TAKE 1 TABLET (20 MG TOTAL) BY MOUTH DAILY AT 6 PM. 30 tablet 7  . Cholecalciferol (VITAMIN D3) 1000 UNITS CAPS Take 1 capsule by mouth daily.      Marland Kitchen. dutasteride (AVODART) 0.5 MG capsule Take 0.5 mg by mouth daily.      . ferrous sulfate 325 (65 FE) MG tablet Take 325 mg by mouth 2 (two) times daily with a meal.    . isosorbide mononitrate (IMDUR) 120 MG 24 hr tablet TAKE 1 TABLET (120 MG TOTAL) BY MOUTH DAILY. NEED OV. 30 tablet 11  . levothyroxine (SYNTHROID, LEVOTHROID) 112 MCG tablet Take 112 mcg by mouth daily before breakfast.    . losartan (COZAAR) 50 MG tablet TAKE 1 TABLET BY MOUTH DAILY. 90 tablet 2  . metoprolol succinate (TOPROL-XL) 100 MG 24 hr tablet Take 1 tablet (100 mg total) by mouth daily. Take with or immediately following a meal. 30 tablet 6  . Multiple Vitamin (MULTIVITAMIN) tablet Take 1 tablet by mouth daily.    . nitroGLYCERIN (NITROSTAT) 0.4 MG SL tablet Place 1 tablet (0.4 mg total) under the tongue every 5 (five) minutes as needed. 25 tablet 2  . omeprazole (PRILOSEC) 20 MG capsule Take 20 mg by mouth daily.      . ranitidine (ZANTAC) 150 MG tablet Take 150 mg by mouth 2 (two) times daily as needed.  99  . Tamsulosin  HCl (FLOMAX) 0.4 MG CAPS Take 0.4 mg by mouth daily.       No current facility-administered medications for this visit.     Past Medical History:  Diagnosis Date  . Anemia, unspecified 12/22/2013  . Arthritis   . Benign prostatic hypertrophy   . Congenital blindness   . Coronary artery disease    a. 06/2004 Inf STEMI with PCI/BMS to RCA/LCX;  b. Cath 2104 40-50% focal bradycardia circ stenosis with a patent stent, RCA 50% stenosis, distal 60% stenosis in a previously stented area. 06/2011 Neg MV, EF 67%.  . Dyslipidemia   . GERD (gastroesophageal reflux disease)   . GI bleeding 2006  . Hypertension   . Hypothyroidism   . Leukopenia 12/22/2013    Past Surgical History:  Procedure Laterality Date  . COLONOSCOPY    . CORONARY ANGIOPLASTY WITH STENT PLACEMENT    . ESOPHAGOGASTRODUODENOSCOPY    . HEMORRHOID SURGERY    . LEFT HEART CATHETERIZATION WITH CORONARY ANGIOGRAM N/A 04/30/2013   Procedure: LEFT HEART CATHETERIZATION WITH CORONARY ANGIOGRAM;  Surgeon: Tonny BollmanMichael Cooper, MD;  [facility];  Service: Cardiovascular;  Laterality: N/A;    ROS: Blind.  ***  PHYSICAL EXAM There were no vitals taken for  this visit.  GENERAL:  Well appearing NECK:  No jugular venous distention, waveform within normal limits, carotid upstroke brisk and symmetric, no bruits, no thyromegaly LUNGS:  Clear to auscultation bilaterally CHEST:  Unremarkable HEART:  PMI not displaced or sustained,S1 and S2 within normal limits, no S3, no S4, no clicks, no rubs, *** murmurs ABD:  Flat, positive bowel sounds normal in frequency in pitch, no bruits, no rebound, no guarding, no midline pulsatile mass, no hepatomegaly, no splenomegaly EXT:  2 plus pulses throughout, no edema, no cyanosis no clubbing    GENERAL:  Well appearing and in no distress  HEENT:  Pupils nonreactive, fundi not visualized, lens opacification, oral mucosa unremarkable NECK:  No jugular venous distention, waveform within normal  limits, carotid upstroke brisk and symmetric, no bruits, no thyromegaly LUNGS:  Clear to auscultation bilaterally CHEST:  Unremarkable HEART:  PMI not displaced or sustained,S1 and S2 within normal limits, no S3, no S4, no clicks, no rubs, no murmurs ABD:  Flat, positive bowel sounds normal in frequency in pitch, no bruits, no rebound, no guarding, no midline pulsatile mass, no hepatomegaly, no splenomegaly EXT:  2 plus pulses throughout, no edema, no cyanosis no clubbing   EKG:  Sinus rhythm, rate ***, axis within normal limits, intervals within normal limits, no acute ST-T wave changes.   ASSESSMENT AND PLAN   CAD -  ***  The patient has no new sypmtoms.  No further cardiovascular testing is indicated.  He has not used any NTG recently.   ESSENTIAL HYPERTENSION, BENIGN -  The blood pressure is ***  at target. No change in medications is indicated. We will continue with therapeutic lifestyle changes (TLC).  No change in therapy  HYPERLIPIDEMIA -  ***Per Laurena Slimmer, MD

## 2017-11-27 ENCOUNTER — Ambulatory Visit: Payer: Medicare PPO | Admitting: Cardiology

## 2017-12-29 NOTE — Progress Notes (Signed)
HPI The patient presents for followup of his known coronary disease.    Since I last saw him he has rarely needed NTG.  This is a stable pattern.  He says if he is rushing to get to work he might have some discomfort.  He will take NTG and ASA and symptoms resolve quickly.  He thinks this is been going on for many years and is unchanged.  His symptoms are not like they were when he needed his last catheterization when he had his MI.  He actually thinks he is doing quite well.  He denies any  shortness of breath, PND or orthopnea.  He has had no weight gain or edema.  Allergies  Allergen Reactions  . Benadryl [Diphenhydramine Hcl] Palpitations    2014  Took one tablet and had heart palpitations    Current Outpatient Medications  Medication Sig Dispense Refill  . acetaminophen (ARTHRITIS PAIN RELIEVER) 650 MG CR tablet Take 650 mg by mouth every 8 (eight) hours as needed for pain.    Marland Kitchen. aspirin 81 MG tablet Take 81 mg by mouth daily.      Marland Kitchen. atorvastatin (LIPITOR) 20 MG tablet TAKE 1 TABLET (20 MG TOTAL) BY MOUTH DAILY AT 6 PM. 30 tablet 7  . Cholecalciferol (VITAMIN D3) 1000 UNITS CAPS Take 1 capsule by mouth daily.      Marland Kitchen. dutasteride (AVODART) 0.5 MG capsule Take 0.5 mg by mouth daily.      . ferrous sulfate 325 (65 FE) MG tablet Take 325 mg by mouth 2 (two) times daily with a meal.    . isosorbide mononitrate (IMDUR) 120 MG 24 hr tablet TAKE 1 TABLET (120 MG TOTAL) BY MOUTH DAILY. NEED OV. 30 tablet 11  . levothyroxine (SYNTHROID, LEVOTHROID) 112 MCG tablet Take 112 mcg by mouth daily before breakfast.    . losartan (COZAAR) 50 MG tablet TAKE 1 TABLET BY MOUTH DAILY. 90 tablet 2  . metoprolol succinate (TOPROL-XL) 100 MG 24 hr tablet Take 1 tablet (100 mg total) by mouth daily. Take with or immediately following a meal. 30 tablet 6  . Multiple Vitamin (MULTIVITAMIN) tablet Take 1 tablet by mouth daily.    . nitroGLYCERIN (NITROSTAT) 0.4 MG SL tablet Place 1 tablet (0.4 mg total) under  the tongue every 5 (five) minutes as needed. 25 tablet 2  . omeprazole (PRILOSEC) 20 MG capsule Take 20 mg by mouth daily.      . Tamsulosin HCl (FLOMAX) 0.4 MG CAPS Take 0.4 mg by mouth daily.       No current facility-administered medications for this visit.     Past Medical History:  Diagnosis Date  . Anemia, unspecified 12/22/2013  . Arthritis   . Benign prostatic hypertrophy   . Congenital blindness   . Coronary artery disease    a. 06/2004 Inf STEMI with PCI/BMS to RCA/LCX;  b. Cath 2104 40-50% focal bradycardia circ stenosis with a patent stent, RCA 50% stenosis, distal 60% stenosis in a previously stented area. 06/2011 Neg MV, EF 67%.  . Dyslipidemia   . GERD (gastroesophageal reflux disease)   . GI bleeding 2006  . Hypertension   . Hypothyroidism   . Leukopenia 12/22/2013    Past Surgical History:  Procedure Laterality Date  . COLONOSCOPY    . CORONARY ANGIOPLASTY WITH STENT PLACEMENT    . ESOPHAGOGASTRODUODENOSCOPY    . HEMORRHOID SURGERY    . LEFT HEART CATHETERIZATION WITH CORONARY ANGIOGRAM N/A 04/30/2013   Procedure:  LEFT HEART CATHETERIZATION WITH CORONARY ANGIOGRAM;  Surgeon: Tonny Bollman, MD;  Location: [facility] CATH LAB;  Service: Cardiovascular;  Laterality: N/A;    ROS:   As stated in the HPI and negative for all other systems.  PHYSICAL EXAM BP (!) 162/84   Pulse 78   Ht 5\' 5"  (1.651 m)   Wt 160 lb (72.6 kg)   BMI 26.63 kg/m   GENERAL:  Well appearing NECK:  No jugular venous distention, waveform within normal limits, carotid upstroke brisk and symmetric, no bruits, no thyromegaly LUNGS:  Clear to auscultation bilaterally CHEST:  Unremarkable HEART:  PMI not displaced or sustained,S1 and S2 within normal limits, no S3, no S4, no clicks, no rubs, no murmurs ABD:  Flat, positive bowel sounds normal in frequency in pitch, no bruits, no rebound, no guarding, no midline pulsatile mass, no hepatomegaly, no splenomegaly EXT:  2 plus pulses throughout, no edema, no  cyanosis no clubbing   EKG:  Sinus rhythm, rate 78 axis within normal limits, intervals within normal limits, no acute ST-T wave changes.  PACs.    ASSESSMENT AND PLAN   CAD -  The patient has no new sypmtoms.  No further cardiovascular testing is indicated.  We will continue with aggressive risk reduction and meds as listed.  ESSENTIAL HYPERTENSION, BENIGN -  The blood pressure is elevated.  However, this is unusual and he will keep a blood pressure diary.  I will not adjust the medications unless I see that his pressure continues to be elevated.  HYPERLIPIDEMIA -  I will bring him back for CMET, CBC, Chol.

## 2017-12-30 ENCOUNTER — Encounter: Payer: Self-pay | Admitting: Cardiology

## 2017-12-30 ENCOUNTER — Ambulatory Visit: Payer: Medicare PPO | Admitting: Cardiology

## 2017-12-30 VITALS — BP 162/84 | HR 78 | Ht 65.0 in | Wt 160.0 lb

## 2017-12-30 DIAGNOSIS — E785 Hyperlipidemia, unspecified: Secondary | ICD-10-CM

## 2017-12-30 DIAGNOSIS — I10 Essential (primary) hypertension: Secondary | ICD-10-CM | POA: Diagnosis not present

## 2017-12-30 DIAGNOSIS — I2510 Atherosclerotic heart disease of native coronary artery without angina pectoris: Secondary | ICD-10-CM

## 2017-12-30 NOTE — Patient Instructions (Signed)
Medication Instructions:  Continue current medications  If you need a refill on your cardiac medications before your next appointment, please call your pharmacy.  Labwork: CBC, CMP and Fasting Lipids HERE IN OUR OFFICE AT [facility]  Take the provided lab slips for you to take with you to the lab for you blood draw.   You will NOT need to fast   Testing/Procedures: None Ordered  Follow-Up: Your physician wants you to follow-up in: 1 Year. You should receive a reminder letter in the mail two months in advance. If you do not receive a letter, please call our office [phone].     Thank you for choosing [facility]!!

## 2018-01-01 DIAGNOSIS — I252 Old myocardial infarction: Secondary | ICD-10-CM | POA: Insufficient documentation

## 2018-01-01 DIAGNOSIS — Z7982 Long term (current) use of aspirin: Secondary | ICD-10-CM | POA: Insufficient documentation

## 2018-01-01 DIAGNOSIS — I10 Essential (primary) hypertension: Secondary | ICD-10-CM | POA: Diagnosis present

## 2018-01-01 DIAGNOSIS — Z951 Presence of aortocoronary bypass graft: Secondary | ICD-10-CM | POA: Diagnosis not present

## 2018-01-01 DIAGNOSIS — Z79899 Other long term (current) drug therapy: Secondary | ICD-10-CM | POA: Insufficient documentation

## 2018-01-02 ENCOUNTER — Emergency Department (HOSPITAL_COMMUNITY)
Admission: EM | Admit: 2018-01-02 | Discharge: 2018-01-02 | Disposition: A | Discharge status: Home / Self Care | Payer: Medicare PPO | Attending: Emergency Medicine | Admitting: Emergency Medicine

## 2018-01-02 ENCOUNTER — Other Ambulatory Visit: Payer: Self-pay

## 2018-01-02 ENCOUNTER — Encounter (HOSPITAL_COMMUNITY): Payer: Self-pay | Admitting: Emergency Medicine

## 2018-01-02 DIAGNOSIS — I10 Essential (primary) hypertension: Secondary | ICD-10-CM

## 2018-01-02 MED ORDER — TRIAMTERENE-HCTZ 37.5-25 MG PO CAPS: 1.0000 | ORAL_CAPSULE | Freq: Every day | ORAL | 0 refills | Status: DC

## 2018-01-02 NOTE — ED Triage Notes (Signed)
Pt from home with c/o htn. Pt states he was seen recently by his cardiologist who told him he has htn. Pt was told to keep an eye on his bp. [name] sister who is present at time of assessment took pt's bp this evening and pt's bp was elevated. Pt is still currently elevated. Pt denies h/a.

## 2018-01-02 NOTE — ED Provider Notes (Signed)
[facility]-EMERGENCY DEPT Provider Note   CSN: [id number] Arrival date & time: 01/01/18  2319     History   Chief Complaint Chief Complaint  Patient presents with  . Hypertension    HPI Dustin Hess is a 72 y.o. male.  The history is provided by the patient.  He has history of hypertension, hyperlipidemia, coronary artery disease, blindness and comes in because of elevated blood pressure.  He saw his cardiologist 2 days ago and blood pressure was elevated in the office.  He was advised to monitor his blood pressure at home.  Blood pressure at home was 180/102, so he came to the ED.  He denies headache, tinnitus, epistaxis.  He states he has been taking his medications.  His primary care provider retired in December, and he has not obtained a new primary care provider.  However, he states that his blood pressure has been well controlled up to that point.  Past Medical History:  Diagnosis Date  . Anemia, unspecified 12/22/2013  . Arthritis   . Benign prostatic hypertrophy   . Congenital blindness   . Coronary artery disease    a. 06/2004 Inf STEMI with PCI/BMS to RCA/LCX;  b. Cath 2104 40-50% focal bradycardia circ stenosis with a patent stent, RCA 50% stenosis, distal 60% stenosis in a previously stented area. 06/2011 Neg MV, EF 67%.  . Dyslipidemia   . GERD (gastroesophageal reflux disease)   . GI bleeding 2006  . Hypertension   . Hypothyroidism   . Leukopenia 12/22/2013    Patient Active Problem List   Diagnosis Date Noted  . CAD (coronary artery disease) of artery bypass graft 11/08/2015  . Leukopenia 12/22/2013  . Thrombocytopenia, unspecified (HCC) 12/22/2013  . Blindness, congenital 04/28/2013  . GERD (gastroesophageal reflux disease) 04/28/2013  . BPH (benign prostatic hypertrophy) 04/28/2013  . HYPOTHYROIDISM 12/13/2008  . HYPERLIPIDEMIA 12/13/2008  . Essential hypertension, benign 12/13/2008  . Coronary atherosclerosis 12/13/2008     Past Surgical History:  Procedure Laterality Date  . COLONOSCOPY    . CORONARY ANGIOPLASTY WITH STENT PLACEMENT    . ESOPHAGOGASTRODUODENOSCOPY    . HEMORRHOID SURGERY    . LEFT HEART CATHETERIZATION WITH CORONARY ANGIOGRAM N/A 04/30/2013   Procedure: LEFT HEART CATHETERIZATION WITH CORONARY ANGIOGRAM;  Surgeon: Tonny Bollman, MD;  Location: [facility] CATH LAB;  Service: Cardiovascular;  Laterality: N/A;        Home Medications    Prior to Admission medications   Medication Sig Start Date End Date Taking? Authorizing Provider  acetaminophen (ARTHRITIS PAIN RELIEVER) 650 MG CR tablet Take 650 mg by mouth every 8 (eight) hours as needed for pain.    [provider]  aspirin 81 MG tablet Take 81 mg by mouth daily.      [provider]  atorvastatin (LIPITOR) 20 MG tablet TAKE 1 TABLET (20 MG TOTAL) BY MOUTH DAILY AT 6 PM. 05/27/17   Rollene Rotunda, MD  Cholecalciferol (VITAMIN D3) 1000 UNITS CAPS Take 1 capsule by mouth daily.      [provider]  dutasteride (AVODART) 0.5 MG capsule Take 0.5 mg by mouth daily.      [provider]  ferrous sulfate 325 (65 FE) MG tablet Take 325 mg by mouth 2 (two) times daily with a meal.    [provider]  isosorbide mononitrate (IMDUR) 120 MG 24 hr tablet TAKE 1 TABLET (120 MG TOTAL) BY MOUTH DAILY. NEED OV. 01/10/17   Rollene Rotunda, MD  levothyroxine (SYNTHROID,  LEVOTHROID) 112 MCG tablet Take 112 mcg by mouth daily before breakfast.    [provider]  losartan (COZAAR) 50 MG tablet TAKE 1 TABLET BY MOUTH DAILY. 05/06/16   Rollene RotundaHochrein, James, MD  metoprolol succinate (TOPROL-XL) 100 MG 24 hr tablet Take 1 tablet (100 mg total) by mouth daily. Take with or immediately following a meal. 03/24/15   Robbie LisSimmons, Brittainy M, PA-C  Multiple Vitamin (MULTIVITAMIN) tablet Take 1 tablet by mouth daily.    [provider]  nitroGLYCERIN (NITROSTAT) 0.4 MG SL tablet Place 1 tablet (0.4 mg total) under the  tongue every 5 (five) minutes as needed. 12/25/16   Rollene RotundaHochrein, James, MD  omeprazole (PRILOSEC) 20 MG capsule Take 20 mg by mouth daily.      [provider]  Tamsulosin HCl (FLOMAX) 0.4 MG CAPS Take 0.4 mg by mouth daily.      [provider]    Family History Family History  Problem Relation Age of Onset  . Cancer Mother 7476       kidney  . Cancer Father 9083       blood cancer  . Heart attack Cousin        died in her 6240's.    Social History Social History   Tobacco Use  . Smoking status: Never Smoker  . Smokeless tobacco: Never Used  Substance Use Topics  . Alcohol use: No  . Drug use: No     Allergies   Benadryl [diphenhydramine hcl]   Review of Systems Review of Systems  All other systems reviewed and are negative.    Physical Exam Updated Vital Signs BP (!) 193/98 (BP Location: Left Arm)   Pulse 66   Temp 98.5 F (36.9 C) (Oral)   Resp 16   SpO2 99%   Physical Exam  Nursing note and vitals reviewed.  72 year old male, resting comfortably and in no acute distress. Vital signs are significant for elevated blood pressure. Oxygen saturation is 99%, which is normal. Head is normocephalic and atraumatic. EOMI. Oropharynx is clear. Neck is nontender and supple without adenopathy or JVD. Back is nontender and there is no CVA tenderness. Lungs are clear without rales, wheezes, or rhonchi. Chest is nontender. Heart has regular rate and rhythm without murmur. Abdomen is soft, flat, nontender without masses or hepatosplenomegaly and peristalsis is normoactive. Extremities have trace edema, full range of motion is present. Skin is warm and dry without rash. Neurologic: Mental status is normal, cranial nerves are intact, there are no motor or sensory deficits.  ED Treatments / Results   Procedures Procedures   Medications Ordered in ED Medications - No data to display   Initial Impression / Assessment and Plan / ED Course  I have reviewed  the triage vital signs and the nursing notes.  Essential hypertension.  No evidence of endorgan damage.  Old records are reviewed, confirming recent office visit with cardiology at which time blood pressure was 162/84, and he was instructed to monitor his blood pressure and adjustments will be made if it continued to be elevated.  Prior office visits I had shown normal to low blood pressures.  He is currently on a beta-blocker and arm.  He does show some edema, so I believe a diuretic would be appropriate.  He is given a prescription for triamterene-hydrochlorothiazide.  He is instructed to take his blood pressure every day and keep a record of it.  Advised that it may take 2 weeks to see the effects  of the medication on his blood pressure.  He is also advised to find a new primary care provider as soon as possible.  In the meantime, his cardiologist can make any dosage adjustments that are needed.  Final Clinical Impressions(s) / ED Diagnoses   Final diagnoses:  Essential hypertension    ED Discharge Orders        Ordered    triamterene-hydrochlorothiazide (DYAZIDE) 37.5-25 MG capsule  Daily     01/02/18 0308       Dione Booze, MD 01/02/18 [phone]

## 2018-01-02 NOTE — Discharge Instructions (Addendum)
Take your blood pressure at home once a day.  Keep a record of your blood pressure, and take that with you when you see your doctor.  Please arrange to get a new primary care physician as soon as possible.  Stay on a low-salt diet.  If you need refills on the medication I have prescribed today, and you have not seen your new primary care provider, then your cardiologist should be able to refill the prescription.  If your blood pressure continues to be very elevated after 2 weeks on the new medication, then contact your cardiologist for further adjustments of your medication.

## 2018-01-07 LAB — LIPID PANEL
CHOL/HDL RATIO: 3.6 ratio (ref 0.0–5.0)
Cholesterol, Total: 115 mg/dL (ref 100–199)
HDL: 32 mg/dL — ABNORMAL LOW (ref >39)
LDL Calculated: 63 mg/dL (ref 0–99)
Triglycerides: 102 mg/dL (ref 0–149)
VLDL CHOLESTEROL CAL: 20 mg/dL (ref 5–40)

## 2018-01-07 LAB — COMPREHENSIVE METABOLIC PANEL
ALBUMIN: 4.6 g/dL (ref 3.5–4.8)
ALK PHOS: 57 IU/L (ref 39–117)
ALT: 16 IU/L (ref 0–44)
AST: 18 IU/L (ref 0–40)
Albumin/Globulin Ratio: 1.6 (ref 1.2–2.2)
BILIRUBIN TOTAL: 1.1 mg/dL (ref 0.0–1.2)
BUN / CREAT RATIO: 19 (ref 10–24)
BUN: 20 mg/dL (ref 8–27)
CHLORIDE: 103 mmol/L (ref 96–106)
CO2: 25 mmol/L (ref 20–29)
CREATININE: 1.05 mg/dL (ref 0.76–1.27)
Calcium: 9.7 mg/dL (ref 8.6–10.2)
GFR calc Af Amer: 82 mL/min/{1.73_m2} (ref >59)
GFR calc non Af Amer: 71 mL/min/{1.73_m2} (ref >59)
GLUCOSE: 87 mg/dL (ref 65–99)
Globulin, Total: 2.8 g/dL (ref 1.5–4.5)
Potassium: 4.8 mmol/L (ref 3.5–5.2)
Sodium: 143 mmol/L (ref 134–144)
Total Protein: 7.4 g/dL (ref 6.0–8.5)

## 2018-01-07 LAB — CBC
HEMATOCRIT: 43.7 % (ref 37.5–51.0)
HEMOGLOBIN: 14.7 g/dL (ref 13.0–17.7)
MCH: 31.6 pg (ref 26.6–33.0)
MCHC: 33.6 g/dL (ref 31.5–35.7)
MCV: 94 fL (ref 79–97)
Platelets: 127 10*3/uL — ABNORMAL LOW (ref 150–379)
RBC: 4.65 x10E6/uL (ref 4.14–5.80)
RDW: 13.1 % (ref 12.3–15.4)
WBC: 3.6 10*3/uL (ref 3.4–10.8)

## 2018-01-26 ENCOUNTER — Other Ambulatory Visit: Payer: Self-pay | Admitting: Cardiology

## 2018-01-26 NOTE — Telephone Encounter (Signed)
REFILL 

## 2018-03-23 ENCOUNTER — Telehealth: Payer: Self-pay | Admitting: Cardiology

## 2018-03-23 MED ORDER — NITROGLYCERIN 0.4 MG SL SUBL: 0.4000 mg | SUBLINGUAL_TABLET | SUBLINGUAL | 2 refills | Status: DC | PRN

## 2018-03-23 NOTE — Telephone Encounter (Signed)
Spoke with pt, refill sent to the pharmacy. 

## 2018-03-23 NOTE — Telephone Encounter (Signed)
New Message:        *STAT* If patient is at the pharmacy, call can be transferred to refill team.   1. Which medications need to be refilled? (please list name of each medication and dose if known) nitroGLYCERIN (NITROSTAT) 0.4 MG SL tablet  2. Which pharmacy/location (including street and city if local pharmacy) is medication to be sent to?[id number] - [place], [place] - [address]  3. Do they need a 30 day or 90 day supply? 30

## 2018-03-23 NOTE — Telephone Encounter (Signed)
New Message   Pt c/o medication issue:  1. Name of Medication: nitroGLYCERIN (NITROSTAT) 0.4 MG SL tablet  2. How are you currently taking this medication (dosage and times per day)? Place 1 tablet (0.4 mg total) under the tongue every 5 (five) minutes as needed.  3. Are you having a reaction (difficulty breathing--STAT)? no  4. What is your medication issue? Pt states that they put in a prescrition for nitroGLYCERIN (NITROSTAT) 0.4 MG SL tablet but it was denied and want to know why. Please call

## 2018-10-08 DIAGNOSIS — Z0000 Encounter for general adult medical examination without abnormal findings: Secondary | ICD-10-CM | POA: Diagnosis not present

## 2018-10-08 DIAGNOSIS — E782 Mixed hyperlipidemia: Secondary | ICD-10-CM | POA: Diagnosis not present

## 2018-10-08 DIAGNOSIS — E0789 Other specified disorders of thyroid: Secondary | ICD-10-CM | POA: Diagnosis not present

## 2018-10-08 DIAGNOSIS — I119 Hypertensive heart disease without heart failure: Secondary | ICD-10-CM | POA: Diagnosis not present

## 2018-10-08 DIAGNOSIS — I10 Essential (primary) hypertension: Secondary | ICD-10-CM | POA: Diagnosis not present

## 2018-10-24 DIAGNOSIS — E785 Hyperlipidemia, unspecified: Secondary | ICD-10-CM | POA: Diagnosis not present

## 2018-10-24 DIAGNOSIS — H547 Unspecified visual loss: Secondary | ICD-10-CM | POA: Diagnosis not present

## 2018-10-24 DIAGNOSIS — I209 Angina pectoris, unspecified: Secondary | ICD-10-CM | POA: Diagnosis not present

## 2018-10-24 DIAGNOSIS — I252 Old myocardial infarction: Secondary | ICD-10-CM | POA: Diagnosis not present

## 2018-10-24 DIAGNOSIS — H409 Unspecified glaucoma: Secondary | ICD-10-CM | POA: Diagnosis not present

## 2018-10-24 DIAGNOSIS — E039 Hypothyroidism, unspecified: Secondary | ICD-10-CM | POA: Diagnosis not present

## 2018-10-24 DIAGNOSIS — K219 Gastro-esophageal reflux disease without esophagitis: Secondary | ICD-10-CM | POA: Diagnosis not present

## 2018-10-24 DIAGNOSIS — I10 Essential (primary) hypertension: Secondary | ICD-10-CM | POA: Diagnosis not present

## 2018-10-24 DIAGNOSIS — H9190 Unspecified hearing loss, unspecified ear: Secondary | ICD-10-CM | POA: Diagnosis not present

## 2018-11-03 ENCOUNTER — Other Ambulatory Visit: Payer: Self-pay | Admitting: Cardiology

## 2018-11-23 ENCOUNTER — Other Ambulatory Visit: Payer: Self-pay | Admitting: Cardiology

## 2018-11-30 ENCOUNTER — Other Ambulatory Visit: Payer: Self-pay | Admitting: Cardiology

## 2018-11-30 NOTE — Telephone Encounter (Signed)
° ° °*  STAT* If patient is at the pharmacy, call can be transferred to refill team.   1. Which medications need to be refilled? (please list name of each medication and dose if known) isosorbide mononitrate (IMDUR) 120 MG 24 hr tablet  2. Which pharmacy/location (including street and city if local pharmacy) [facility] [address] [facility]  3. Do they need a 30 day or 90 day supply? 90

## 2018-12-01 MED ORDER — ISOSORBIDE MONONITRATE ER 120 MG PO TB24: 120.0000 mg | ORAL_TABLET | Freq: Every day | ORAL | 0 refills | Status: DC

## 2018-12-01 NOTE — Telephone Encounter (Signed)
Patients spouse notified of medication refill and voiced understanding.

## 2019-01-18 ENCOUNTER — Telehealth: Payer: Self-pay | Admitting: Cardiology

## 2019-01-18 NOTE — Progress Notes (Signed)
Virtual Visit via Telephone Note   This visit type was conducted due to national recommendations for restrictions regarding the COVID-19 Pandemic (e.g. social distancing) in an effort to limit this patient's exposure and mitigate transmission in our community.  Due to his co-morbid illnesses, this patient is at least at moderate risk for complications without adequate follow up.  This format is felt to be most appropriate for this patient at this time.  The patient did not have access to video technology/had technical difficulties with video requiring transitioning to audio format only (telephone).  All issues noted in this document were discussed and addressed.  No physical exam could be performed with this format.  Please refer to the patient's chart for his  consent to telehealth for [facility].   Evaluation Performed:  Follow-up visit  Date:  01/19/2019   ID:  Dustin Hess, DOB Apr 19, 1946, MRN [id number]  Patient Location: Home Provider Location: Home  PCP:  Patient, No Pcp Per  Cardiologist:  Dustin Rotunda, MD  Electrophysiologist:  None   Chief Complaint:  CAD   History of Present Illness:    Dustin Hess is a 73 y.o. male who presents for followup of his known coronary disease.    Since I last saw him he was in the ED April of last year with HTN urgency.  I reviewed these records for this visit.   His BP was 205/98.  Since then I cannot find any BPs in our records.  He says that it is OK.  The patient denies any new symptoms such as chest discomfort, neck or arm discomfort. There has been no new shortness of breath, PND or orthopnea. There have been no reported palpitations, presyncope or syncope.  He is still working during the pandemic.  He does not wear a mask at work.  He did have one episode of chest pain and he needed to take a NTG or two in Feb. however, this is unusual for him.  He is otherwise not required this.  He has not had any new shortness of  breath, PND or orthopnea.  He has had no weight gain or edema.  The patient does not have symptoms concerning for COVID-19 infection (fever, chills, cough, or new shortness of breath).    Past Medical History:  Diagnosis Date   Anemia, unspecified 12/22/2013   Arthritis    Benign prostatic hypertrophy    Congenital blindness    Coronary artery disease    a. 06/2004 Inf STEMI with PCI/BMS to RCA/LCX;  b. Cath 2104 40-50% focal bradycardia circ stenosis with a patent stent, RCA 50% stenosis, distal 60% stenosis in a previously stented area. 06/2011 Neg MV, EF 67%.   Dyslipidemia    GERD (gastroesophageal reflux disease)    GI bleeding 2006   Hypertension    Hypothyroidism    Leukopenia 12/22/2013   Past Surgical History:  Procedure Laterality Date   COLONOSCOPY     CORONARY ANGIOPLASTY WITH STENT PLACEMENT     ESOPHAGOGASTRODUODENOSCOPY     HEMORRHOID SURGERY     LEFT HEART CATHETERIZATION WITH CORONARY ANGIOGRAM N/A 04/30/2013   Procedure: LEFT HEART CATHETERIZATION WITH CORONARY ANGIOGRAM;  Surgeon: Tonny Bollman, MD;  [facility]: [facility] CATH LAB;  Service: Cardiovascular;  Laterality: N/A;     Current Meds  Medication Sig   acetaminophen (ARTHRITIS PAIN RELIEVER) 650 MG CR tablet Take 650 mg by mouth every 8 (eight) hours as needed for pain.   aspirin 81 MG tablet Take  81 mg by mouth daily.     atorvastatin (LIPITOR) 20 MG tablet TAKE 1 TABLET (20 MG TOTAL) BY MOUTH DAILY AT 6 PM.   Cholecalciferol (VITAMIN D3) 1000 UNITS CAPS Take 1 capsule by mouth daily.     dutasteride (AVODART) 0.5 MG capsule Take 0.5 mg by mouth daily.     ferrous sulfate 325 (65 FE) MG tablet Take 325 mg by mouth 2 (two) times daily with a meal.   isosorbide mononitrate (IMDUR) 120 MG 24 hr tablet Take 1 tablet (120 mg total) by mouth daily. Please make yearly appt with Dr. Antoine PocheHochrein for April before anymore refills. 1st attempt   levothyroxine (SYNTHROID, LEVOTHROID) 112 MCG tablet Take  112 mcg by mouth daily before breakfast.   losartan (COZAAR) 50 MG tablet TAKE 1 TABLET BY MOUTH DAILY.   metoprolol succinate (TOPROL-XL) 100 MG 24 hr tablet Take 1 tablet (100 mg total) by mouth daily. Take with or immediately following a meal.   Multiple Vitamin (MULTIVITAMIN) tablet Take 1 tablet by mouth daily.   nitroGLYCERIN (NITROSTAT) 0.4 MG SL tablet Place 1 tablet (0.4 mg total) under the tongue every 5 (five) minutes as needed.   omeprazole (PRILOSEC) 20 MG capsule Take 20 mg by mouth daily.     Tamsulosin HCl (FLOMAX) 0.4 MG CAPS Take 0.4 mg by mouth daily.     triamterene-hydrochlorothiazide (DYAZIDE) 37.5-25 MG capsule Take 1 each (1 capsule total) by mouth daily.     Allergies:   Benadryl [diphenhydramine hcl]   Social History   Tobacco Use   Smoking status: Never Smoker   Smokeless tobacco: Never Used  Substance Use Topics   Alcohol use: No   Drug use: No     Family Hx: The patient's family history includes Cancer (age of onset: 7976) in his mother; Cancer (age of onset: 7683) in his father; Heart attack in his cousin.  ROS:   Please see the history of present illness.    As stated in the HPI and negative for all other systems.   Prior CV studies:   The following studies were reviewed today:  Labs  Labs/Other Tests and Data Reviewed:    EKG:  No ECG reviewed.  Recent Labs: No results found for requested labs within last 8760 hours.   Recent Lipid Panel Lab Results  Component Value Date/Time   CHOL 115 01/07/2018 08:33 AM   TRIG 102 01/07/2018 08:33 AM   HDL 32 (L) 01/07/2018 08:33 AM   CHOLHDL 3.6 01/07/2018 08:33 AM   CHOLHDL 4 07/01/2012 10:09 AM   LDLCALC 63 01/07/2018 08:33 AM    Wt Readings from Last 3 Encounters:  01/19/19 155 lb (70.3 kg)  12/30/17 160 lb (72.6 kg)  12/25/16 161 lb (73 kg)     Objective:    Vital Signs:  Ht 5\' 5"  (1.651 m)    Wt 155 lb (70.3 kg)    BMI 25.79 kg/m     ASSESSMENT & PLAN:    CAD:  The  patient has no new sypmtoms.  No further cardiovascular testing is indicated.  We will continue with aggressive risk reduction and meds as listed.  HTN:   His blood pressure is not been checked recently and was very elevated.  He can check it at home because of his blindness.  He will go to the pharmacy and get this checked 3 times in the next week.  If it is still elevated I will adjust his medications.  He will call  us with these results.  I did review his hospital records and emergency room visit and for now he will be no change in therapy.  DYSLIPIDEMIA:  LDL earlier this year was 7 with an HDL of 31.  He will continue the meds as listed.  COVID-19 Education: The signs and symptoms of COVID-19 were discussed with the patient and how to seek care for testing (follow up with PCP or arrange E-visit).  The importance of social distancing was discussed today.  I asked him to wear a mask at work  Time:   Today, I have spent 20 minutes with the patient with telehealth technology discussing the above problems.     Medication Adjustments/Labs and Tests Ordered: Current medicines are reviewed at length with the patient today.  Concerns regarding medicines are outlined above.   Tests Ordered: No orders of the defined types were placed in this encounter.   Medication Changes: No orders of the defined types were placed in this encounter.   Disposition:  Follow up one year but I will review his BPs before this.   Signed, Dustin Rotunda, MD  01/19/2019 12:10 PM     [facility]

## 2019-01-18 NOTE — Telephone Encounter (Signed)
Homephone/ declined my chart/ virtual consent/ pre reg completed °

## 2019-01-19 ENCOUNTER — Encounter: Payer: Self-pay | Admitting: Cardiology

## 2019-01-19 ENCOUNTER — Telehealth (INDEPENDENT_AMBULATORY_CARE_PROVIDER_SITE_OTHER): Payer: Medicare PPO | Admitting: Cardiology

## 2019-01-19 VITALS — Ht 65.0 in | Wt 155.0 lb

## 2019-01-19 DIAGNOSIS — I2510 Atherosclerotic heart disease of native coronary artery without angina pectoris: Secondary | ICD-10-CM | POA: Diagnosis not present

## 2019-01-19 DIAGNOSIS — Z7189 Other specified counseling: Secondary | ICD-10-CM | POA: Insufficient documentation

## 2019-01-19 DIAGNOSIS — I10 Essential (primary) hypertension: Secondary | ICD-10-CM

## 2019-01-19 NOTE — Patient Instructions (Signed)
Medication Instructions:  Continue current medications  If you need a refill on your cardiac medications before your next appointment, please call your pharmacy.  Labwork: None Ordered   Testing/Procedures: None Ordered  Follow-Up: You will need a follow up appointment in 6 months.  Please call our office 2 months in advance to schedule this appointment.  You may see James Hochrein, MD or one of the following Advanced Practice Providers on your designated Care Team:   Rhonda Barrett, PA-C . Kathryn Donte, DNP, ANP    At [facility], you and your health needs are our priority.  As part of our continuing mission to provide you with exceptional heart care, we have created designated Provider Care Teams.  These Care Teams include your primary Cardiologist (physician) and Advanced Practice Providers (APPs -  Physician Assistants and Nurse Practitioners) who all work together to provide you with the care you need, when you need it.  Thank you for choosing [facility] at [facility]!!     

## 2019-01-25 ENCOUNTER — Telehealth: Payer: Self-pay | Admitting: Cardiology

## 2019-01-25 DIAGNOSIS — I10 Essential (primary) hypertension: Secondary | ICD-10-CM

## 2019-01-25 NOTE — Telephone Encounter (Signed)
New message   Patient's daughter has b/p readings:   01/19/19  162/89 01/22/19 178/95 01/23/19 132/83

## 2019-01-25 NOTE — Telephone Encounter (Signed)
Spoke to patient's daughter.Stated she did not write down pulse readings just B/P.Stated she was told pulse normal.Advised I will send B/P readings to Dr.Hochrein for review.

## 2019-01-26 NOTE — Telephone Encounter (Signed)
Increase Cozaar to 75 mg po daily and schedule follow up BMET in one month.  Keep a BP diary and give me that information in 1 month

## 2019-01-26 NOTE — Telephone Encounter (Signed)
Call and spoke with pt, It seems like pt have not been taking Losartan, it's on his medications list but he do not have that medication at home, please advised?

## 2019-01-26 NOTE — Telephone Encounter (Signed)
New Message ° °Please call patient back. °

## 2019-01-26 NOTE — Telephone Encounter (Signed)
Leave message for pt to call back 

## 2019-01-28 MED ORDER — LOSARTAN POTASSIUM 50 MG PO TABS: 50.0000 mg | ORAL_TABLET | Freq: Every day | ORAL | 3 refills | Status: DC

## 2019-01-28 NOTE — Telephone Encounter (Signed)
Follow Up:   Returning call from this morning, she does not know who called.s

## 2019-01-28 NOTE — Telephone Encounter (Signed)
Called patient back with Dr. Jenene Slicker recommendations. Patient verbalized understanding. Sent medications to patient's pharmacy of choice and ordered BMET for patient to come in on May 15th for lab work.

## 2019-01-28 NOTE — Telephone Encounter (Signed)
Start back at the 50 mg Cozaar previous dose that he was supposed to be taking.  Check a bmet in two weeks.

## 2019-02-09 DIAGNOSIS — I10 Essential (primary) hypertension: Secondary | ICD-10-CM | POA: Diagnosis not present

## 2019-02-09 DIAGNOSIS — E0789 Other specified disorders of thyroid: Secondary | ICD-10-CM | POA: Diagnosis not present

## 2019-02-09 DIAGNOSIS — H540X55 Blindness right eye category 5, blindness left eye category 5: Secondary | ICD-10-CM | POA: Diagnosis not present

## 2019-02-09 DIAGNOSIS — E782 Mixed hyperlipidemia: Secondary | ICD-10-CM | POA: Diagnosis not present

## 2019-02-12 ENCOUNTER — Other Ambulatory Visit: Payer: Self-pay

## 2019-02-12 DIAGNOSIS — I10 Essential (primary) hypertension: Secondary | ICD-10-CM | POA: Diagnosis not present

## 2019-02-12 LAB — BASIC METABOLIC PANEL
BUN/Creatinine Ratio: 15 (ref 10–24)
BUN: 17 mg/dL (ref 8–27)
CO2: 25 mmol/L (ref 20–29)
Calcium: 9.4 mg/dL (ref 8.6–10.2)
Chloride: 104 mmol/L (ref 96–106)
Creatinine, Ser: 1.1 mg/dL (ref 0.76–1.27)
GFR calc Af Amer: 77 mL/min/{1.73_m2} (ref >59)
GFR calc non Af Amer: 66 mL/min/{1.73_m2} (ref >59)
Glucose: 90 mg/dL (ref 65–99)
Potassium: 4.4 mmol/L (ref 3.5–5.2)
Sodium: 142 mmol/L (ref 134–144)

## 2019-03-24 ENCOUNTER — Telehealth: Payer: Self-pay | Admitting: Cardiology

## 2019-03-24 NOTE — Telephone Encounter (Signed)
New message    *STAT* If patient is at the pharmacy, call can be transferred to refill team.   1. Which medications need to be refilled? (please list name of each medication and dose if known)   nitroGLYCERIN (NITROSTAT) 0.4 MG SL tablet     2. Which pharmacy/location (including street and city if local pharmacy) is [id number] [place] [place] [address]  3. Do they need a 30 day or 90 day supply? [place]

## 2019-03-25 MED ORDER — NITROGLYCERIN 0.4 MG SL SUBL: 0.4000 mg | SUBLINGUAL_TABLET | SUBLINGUAL | 2 refills | Status: DC | PRN

## 2019-04-15 DIAGNOSIS — N400 Enlarged prostate without lower urinary tract symptoms: Secondary | ICD-10-CM | POA: Diagnosis not present

## 2019-04-22 DIAGNOSIS — R351 Nocturia: Secondary | ICD-10-CM | POA: Diagnosis not present

## 2019-04-26 ENCOUNTER — Other Ambulatory Visit: Payer: Self-pay | Admitting: Cardiology

## 2019-04-27 ENCOUNTER — Other Ambulatory Visit: Payer: Self-pay | Admitting: *Deleted

## 2019-04-27 ENCOUNTER — Telehealth: Payer: Self-pay | Admitting: Cardiology

## 2019-04-27 MED ORDER — ISOSORBIDE MONONITRATE ER 120 MG PO TB24: 120.0000 mg | ORAL_TABLET | Freq: Every day | ORAL | 3 refills | Status: DC

## 2019-04-27 MED ORDER — METOPROLOL SUCCINATE ER 100 MG PO TB24: 100.0000 mg | ORAL_TABLET | Freq: Every day | ORAL | 3 refills | Status: DC

## 2019-04-27 NOTE — Telephone Encounter (Signed)
REFILL'S SENT  

## 2019-04-27 NOTE — Telephone Encounter (Signed)
° ° °*  STAT* If patient is at the pharmacy, call can be transferred to refill team.   1. Which medications need to be refilled? (please list name of each medication and dose if known)  isosorbide mononitrate (IMDUR) 120 MG 24 hr tablet metoprolol succinate (TOPROL-XL) 100 MG 24 hr tablet  2. Which pharmacy/location (including street and city if local pharmacy) is medication to be sent to? [id number] [place] [place] [facility] [address]  3. Do they need a 30 day or 90 day supply? 30 with refills  Patient has an appointment scheduled to see Dr. Percival Spanish on 08/02/19.

## 2019-06-01 ENCOUNTER — Other Ambulatory Visit: Payer: Self-pay | Admitting: Cardiology

## 2019-06-09 ENCOUNTER — Other Ambulatory Visit: Payer: Self-pay | Admitting: Cardiology

## 2019-06-15 DIAGNOSIS — E782 Mixed hyperlipidemia: Secondary | ICD-10-CM | POA: Diagnosis not present

## 2019-06-15 DIAGNOSIS — H540X55 Blindness right eye category 5, blindness left eye category 5: Secondary | ICD-10-CM | POA: Diagnosis not present

## 2019-06-15 DIAGNOSIS — I119 Hypertensive heart disease without heart failure: Secondary | ICD-10-CM | POA: Diagnosis not present

## 2019-06-15 DIAGNOSIS — E0789 Other specified disorders of thyroid: Secondary | ICD-10-CM | POA: Diagnosis not present

## 2019-06-29 ENCOUNTER — Other Ambulatory Visit: Payer: Self-pay | Admitting: Cardiology

## 2019-06-29 NOTE — Telephone Encounter (Signed)
°*  STAT* If patient is at the pharmacy, call can be transferred to refill team.   1. Which medications need to be refilled? (please list name of each medication and dose if known) Metoprolol 100 mg  2. Which pharmacy/location (including street and city if local pharmacy) is [facility] to be [facility] to? [facility] [address]  3. Do they need a 30 day or 90 day supply? [place]

## 2019-06-30 MED ORDER — METOPROLOL SUCCINATE ER 100 MG PO TB24: 100.0000 mg | ORAL_TABLET | Freq: Every day | ORAL | 0 refills | Status: DC

## 2019-06-30 NOTE — Telephone Encounter (Signed)
Requested Prescriptions   Signed Prescriptions Disp Refills  . metoprolol succinate (TOPROL-XL) 100 MG 24 hr tablet 90 tablet 0    Sig: Take 1 tablet (100 mg total) by mouth daily. Take with or immediately following a meal.    Authorizing Provider: Minus Breeding    Ordering User: Raelene Bott, BRANDY L

## 2019-08-01 DIAGNOSIS — E785 Hyperlipidemia, unspecified: Secondary | ICD-10-CM | POA: Insufficient documentation

## 2019-08-01 NOTE — Progress Notes (Signed)
Cardiology Office Note   Date:  08/02/2019   ID:  Dustin MainlandLawrence A Stirewalt, DOB [birth year], MRN [id number]  PCP:  Patient, No Pcp Per  Cardiologist:   Rollene RotundaJames Kenidi Elenbaas, MD   No chief complaint on file.     History of Present Illness: Dustin Hess is a 73 y.o. male who presents for followup of his known coronary disease.  He was in the ED April of last year with HTN urgency.   His BP was 205/98.  I discussed this with him on a virtual visit in April.  Since I saw him he has done well.  The patient denies any  new shortness of breath, PND or orthopnea. There have been no reported palpitations, presyncope or syncope.  However, he has had a couple episodes of his left arm aching.  He says it kind of draws up on and feels like a heart attack wanting to happen.  He is taken a few nitroglycerin.  He had 1 of these few days ago.  This happens sporadically.  Is moderate.  He can bring it on.  He is probably taking about 3 or 4 nitroglycerin in the last 6 months.  He still works in [employer].  He is making plans right now.  Past Medical History:  Diagnosis Date  . Anemia, unspecified 12/22/2013  . Arthritis   . Benign prostatic hypertrophy   . Congenital blindness   . Coronary artery disease    a. 06/2004 Inf STEMI with PCI/BMS to RCA/LCX;  b. Cath 2104 40-50% focal bradycardia circ stenosis with a patent stent, RCA 50% stenosis, distal 60% stenosis in a previously stented area. 06/2011 Neg MV, EF 67%.  . Dyslipidemia   . GERD (gastroesophageal reflux disease)   . GI bleeding 2006  . Hypertension   . Hypothyroidism   . Leukopenia 12/22/2013    Past Surgical History:  Procedure Laterality Date  . COLONOSCOPY    . CORONARY ANGIOPLASTY WITH STENT PLACEMENT    . ESOPHAGOGASTRODUODENOSCOPY    . HEMORRHOID SURGERY    . LEFT HEART CATHETERIZATION WITH CORONARY ANGIOGRAM N/A 04/30/2013   Procedure: LEFT HEART CATHETERIZATION WITH CORONARY ANGIOGRAM;  Surgeon: Tonny BollmanMichael  Cooper, MD;  Location: [facility] CATH LAB;  Service: Cardiovascular;  Laterality: N/A;     Current Outpatient Medications  Medication Sig Dispense Refill  . acetaminophen (ARTHRITIS PAIN RELIEVER) 650 MG CR tablet Take 650 mg by mouth every 8 (eight) hours as needed for pain.    Marland Kitchen. aspirin 81 MG tablet Take 81 mg by mouth daily.      Marland Kitchen. atorvastatin (LIPITOR) 20 MG tablet TAKE 1 TABLET (20 MG TOTAL) BY MOUTH DAILY AT 6 PM. 90 tablet 0  . Cholecalciferol (VITAMIN D3) 1000 UNITS CAPS Take 1 capsule by mouth daily.      Marland Kitchen. dutasteride (AVODART) 0.5 MG capsule Take 0.5 mg by mouth daily.      . ferrous sulfate 325 (65 FE) MG tablet Take 325 mg by mouth 2 (two) times daily with a meal.    . isosorbide mononitrate (IMDUR) 120 MG 24 hr tablet Take 1 tablet (120 mg total) by mouth daily. KEEP NOV APPT WITH DR Jaren Kearn 30 tablet 3  . levothyroxine (SYNTHROID, LEVOTHROID) 112 MCG tablet Take 112 mcg by mouth daily before breakfast.    . losartan (COZAAR) 100 MG tablet Take 1 tablet (100 mg total) by mouth daily. 90 tablet 3  . metoprolol succinate (TOPROL-XL) 100 MG 24 hr tablet Take  1 tablet (100 mg total) by mouth daily. Take with or immediately following a meal. 90 tablet 0  . Multiple Vitamin (MULTIVITAMIN) tablet Take 1 tablet by mouth daily.    . nitroGLYCERIN (NITROSTAT) 0.4 MG SL tablet Place 1 tablet (0.4 mg total) under the tongue every 5 (five) minutes as needed. 25 tablet 2  . omeprazole (PRILOSEC) 20 MG capsule Take 20 mg by mouth daily.      . Tamsulosin HCl (FLOMAX) 0.4 MG CAPS Take 0.4 mg by mouth daily.      Marland Kitchen triamterene-hydrochlorothiazide (DYAZIDE) 37.5-25 MG capsule Take 1 each (1 capsule total) by mouth daily. 30 capsule 0   No current facility-administered medications for this visit.     Allergies:   Benadryl [diphenhydramine hcl]    ROS:  Please see the history of present illness.   Otherwise, review of systems are positive for none.   All other systems are reviewed and negative.     PHYSICAL EXAM: VS:  BP (!) 179/95   Pulse 69   Temp (!) 97.2 F (36.2 C)   Ht 5\' 9"  (1.753 m)   Wt 164 lb (74.4 kg)   SpO2 99%   BMI 24.22 kg/m  , BMI Body mass index is 24.22 kg/m. GENERAL:  Well appearing NECK:  No jugular venous distention, waveform within normal limits, carotid upstroke brisk and symmetric, no bruits, no thyromegaly LUNGS:  Clear to auscultation bilaterally CHEST:  Unremarkable HEART:  PMI not displaced or sustained,S1 and S2 within normal limits, no S3, no S4, no clicks, no rubs, no murmurs ABD:  Flat, positive bowel sounds normal in frequency in pitch, no bruits, no rebound, no guarding, no midline pulsatile mass, no hepatomegaly, no splenomegaly EXT:  2 plus pulses throughout, no edema, no cyanosis no clubbing   EKG:  EKG is ordered today. The ekg ordered today demonstrates sinus rhythm with premature atrial contractions.  Possible right atrial enlargement, no acute ST-T wave changes.   Recent Labs: 02/12/2019: BUN 17; Creatinine, Ser 1.10; Potassium 4.4; Sodium 142    Lipid Panel    Component Value Date/Time   CHOL 115 01/07/2018 0833   TRIG 102 01/07/2018 0833   HDL 32 (L) 01/07/2018 0833   CHOLHDL 3.6 01/07/2018 0833   CHOLHDL 4 07/01/2012 1009   VLDL 15.0 07/01/2012 1009   LDLCALC 63 01/07/2018 0833      Wt Readings from Last 3 Encounters:  08/02/19 164 lb (74.4 kg)  01/19/19 155 lb (70.3 kg)  12/30/17 160 lb (72.6 kg)      Other studies Reviewed: Additional studies/ records that were reviewed today include: None. Review of the above records demonstrates:  Please see elsewhere in the note.     ASSESSMENT AND PLAN:  CAD:   Given the arm pain and his previous history needs stress testing.  He would be able walk on a treadmill.  Therefore, he will have a  [employer].  HTN: His blood pressure is not controlled.  I am getting increase his Cozaar to 100 mg daily.  Georgina Peer try to get him a blood pressure cuff that is audible given his  blindness.  He will be able to keep a blood pressure diary then.  DYSLIPIDEMIA: I will check a lipid profile when he returns.  Current medicines are reviewed at length with the patient today.  The patient does not have concerns regarding medicines.  The following changes have been made:  As above.   Labs/ tests ordered today include:  Orders Placed This Encounter  Procedures  . Comprehensive metabolic panel  . Lipid panel  . MYOCARDIAL PERFUSION IMAGING  . EKG 12-Lead     Disposition:   FU with me in one year or sooner based on symptoms.      Signed, Rollene Rotunda, MD  08/02/2019 10:41 AM    [facility]

## 2019-08-02 ENCOUNTER — Telehealth: Payer: Self-pay | Admitting: Licensed Clinical Social Worker

## 2019-08-02 ENCOUNTER — Encounter: Payer: Self-pay | Admitting: Cardiology

## 2019-08-02 ENCOUNTER — Other Ambulatory Visit: Payer: Self-pay

## 2019-08-02 ENCOUNTER — Telehealth: Payer: Self-pay

## 2019-08-02 ENCOUNTER — Ambulatory Visit: Payer: Medicare PPO | Admitting: Cardiology

## 2019-08-02 VITALS — BP 179/95 | HR 69 | Temp 97.2°F | Ht 69.0 in | Wt 164.0 lb

## 2019-08-02 DIAGNOSIS — I10 Essential (primary) hypertension: Secondary | ICD-10-CM

## 2019-08-02 DIAGNOSIS — E785 Hyperlipidemia, unspecified: Secondary | ICD-10-CM | POA: Diagnosis not present

## 2019-08-02 DIAGNOSIS — I2510 Atherosclerotic heart disease of native coronary artery without angina pectoris: Secondary | ICD-10-CM | POA: Diagnosis not present

## 2019-08-02 DIAGNOSIS — Z23 Encounter for immunization: Secondary | ICD-10-CM

## 2019-08-02 DIAGNOSIS — M79603 Pain in arm, unspecified: Secondary | ICD-10-CM

## 2019-08-02 MED ORDER — LOSARTAN POTASSIUM 100 MG PO TABS: 100.0000 mg | ORAL_TABLET | Freq: Every day | ORAL | 3 refills | Status: DC

## 2019-08-02 NOTE — Telephone Encounter (Signed)
-----   Message from Louann Liv, [place] sent at 08/02/2019 11:14 AM EST ----- Regarding: RE: AUDIBLE BLOOD PRESSURE CUFF FOR THE BLIND I found one on [employer] for the visually impaired. I ordered it and the patient will receive it on Wednesday. It also provides the instructions for use in audible mode as well.  Thanks, Kennyth Lose  ----- Message ----- From: Annita Brod, RN Sent: 08/02/2019  10:56 AM EST To: Minus Breeding, MD, Louann Liv, LCSW, # Subject: AUDIBLE BLOOD PRESSURE CUFF FOR THE BLIND      Hello,   Dr. Percival Spanish would like you to follow up with this patient so that he can be provided with an audible blood pressure cuff for the blind. Please reach out to [place], RN with any updates on the status of this request.  Thank you,  [place]

## 2019-08-02 NOTE — Telephone Encounter (Signed)
DPR on file. Spoke to pt wife regarding BP cuff for the visually impaired. Informed her of the information below. She verbalized understanding

## 2019-08-02 NOTE — Telephone Encounter (Signed)
CSW referred to assist patient with obtaining a BP cuff. CSW contacted patient to inform cuff will be delivered to home. Patient grateful for support and assistance. CSW available as needed. Jackie Danial Hlavac, LCSW, CCSW-MCS [phone]  

## 2019-08-02 NOTE — Patient Instructions (Addendum)
Medication Instructions:  Your physician has recommended you make the following change in your medication:   INCREASE YOUR COZAAR TO 100 MG BY MOUTH DAILY  *If you need a refill on your cardiac medications before your next appointment, please call your pharmacy*  Lab Work: Your physician recommends that you return for lab work WITHIN 1 WEEK: [place]; FASTING LIPID PROFILE  If you have labs (blood work) drawn today and your tests are completely normal, you will receive your results only by: Marland Kitchen MyChart Message (if you have MyChart) OR . A paper copy in the mail If you have any lab test that is abnormal or we need to change your treatment, we will call you to review the results.  Testing/Procedures: Your physician has requested that you have a lexiscan myoview. For further information please visit [email or web address]. Please follow instruction sheet, as given.    Follow-Up: At [facility], you and your health needs are our priority.  As part of our continuing mission to provide you with exceptional heart care, we have created designated Provider Care Teams.  These Care Teams include your primary Cardiologist (physician) and Advanced Practice Providers (APPs -  Physician Assistants and Nurse Practitioners) who all work together to provide you with the care you need, when you need it.  Your next appointment:   12 months  The format for your next appointment:   In Person  Provider:   Minus Breeding, MD  Other Instructions  YOU WILL BE CONTACTED BY Raquel Sarna REGARDING SUPPLYING YOU WITH AN AUDIBLE BLOOD PRESSURE CUFF

## 2019-08-05 ENCOUNTER — Telehealth (HOSPITAL_COMMUNITY): Payer: Self-pay

## 2019-08-05 NOTE — Telephone Encounter (Signed)
Encounter complete. 

## 2019-08-06 ENCOUNTER — Telehealth (HOSPITAL_COMMUNITY): Payer: Self-pay

## 2019-08-06 NOTE — Telephone Encounter (Signed)
Encounter complete. 

## 2019-08-09 DIAGNOSIS — E785 Hyperlipidemia, unspecified: Secondary | ICD-10-CM | POA: Diagnosis not present

## 2019-08-09 DIAGNOSIS — I10 Essential (primary) hypertension: Secondary | ICD-10-CM | POA: Diagnosis not present

## 2019-08-10 ENCOUNTER — Ambulatory Visit (HOSPITAL_COMMUNITY)
Admission: RE | Admit: 2019-08-10 | Discharge: 2019-08-10 | Disposition: A | Discharge status: Home / Self Care | Payer: Medicare PPO | Source: Ambulatory Visit | Attending: Cardiology | Admitting: Cardiology

## 2019-08-10 ENCOUNTER — Other Ambulatory Visit: Payer: Self-pay

## 2019-08-10 DIAGNOSIS — I2510 Atherosclerotic heart disease of native coronary artery without angina pectoris: Secondary | ICD-10-CM | POA: Diagnosis not present

## 2019-08-10 LAB — MYOCARDIAL PERFUSION IMAGING
LV dias vol: 86 mL (ref 62–150)
LV sys vol: 36 mL
Peak HR: 99 {beats}/min
Rest HR: 61 {beats}/min
SDS: 0
SRS: 0
SSS: 0
TID: 0.94

## 2019-08-10 MED ORDER — REGADENOSON 0.4 MG/5ML IV SOLN: 0.4000 mg | Freq: Once | INTRAVENOUS | Status: DC

## 2019-08-10 MED ORDER — TECHNETIUM TC 99M TETROFOSMIN IV KIT
10.9000 | PACK | Freq: Once | INTRAVENOUS | Status: DC | PRN
  Filled 2019-08-10: qty 11

## 2019-08-10 MED ORDER — TECHNETIUM TC 99M TETROFOSMIN IV KIT
29.4000 | PACK | Freq: Once | INTRAVENOUS | Status: DC | PRN
  Filled 2019-08-10: qty 30

## 2019-08-11 LAB — LIPID PANEL
Chol/HDL Ratio: 3.3 ratio (ref 0.0–5.0)
Cholesterol, Total: 109 mg/dL (ref 100–199)
HDL: 33 mg/dL — ABNORMAL LOW (ref >39)
LDL Chol Calc (NIH): 60 mg/dL (ref 0–99)
Triglycerides: 80 mg/dL (ref 0–149)
VLDL Cholesterol Cal: 16 mg/dL (ref 5–40)

## 2019-08-11 LAB — COMPREHENSIVE METABOLIC PANEL
ALT: 13 IU/L (ref 0–44)
AST: 18 IU/L (ref 0–40)
Albumin/Globulin Ratio: 1.6 (ref 1.2–2.2)
Albumin: 4.3 g/dL (ref 3.7–4.7)
Alkaline Phosphatase: 57 IU/L (ref 39–117)
BUN/Creatinine Ratio: 15 (ref 10–24)
BUN: 17 mg/dL (ref 8–27)
Bilirubin Total: 0.4 mg/dL (ref 0.0–1.2)
CO2: 20 mmol/L (ref 20–29)
Calcium: 9.5 mg/dL (ref 8.6–10.2)
Chloride: 108 mmol/L — ABNORMAL HIGH (ref 96–106)
Creatinine, Ser: 1.16 mg/dL (ref 0.76–1.27)
GFR calc Af Amer: 71 mL/min/{1.73_m2} (ref >59)
GFR calc non Af Amer: 62 mL/min/{1.73_m2} (ref >59)
Globulin, Total: 2.7 g/dL (ref 1.5–4.5)
Glucose: 82 mg/dL (ref 65–99)
Potassium: 4.6 mmol/L (ref 3.5–5.2)
Sodium: 147 mmol/L — ABNORMAL HIGH (ref 134–144)
Total Protein: 7 g/dL (ref 6.0–8.5)

## 2019-08-13 ENCOUNTER — Encounter: Payer: Self-pay | Admitting: *Deleted

## 2019-08-23 ENCOUNTER — Other Ambulatory Visit: Payer: Self-pay

## 2019-08-23 MED ORDER — ISOSORBIDE MONONITRATE ER 120 MG PO TB24: 120.0000 mg | ORAL_TABLET | Freq: Every day | ORAL | 3 refills | Status: DC

## 2019-09-07 ENCOUNTER — Telehealth: Payer: Self-pay | Admitting: Cardiology

## 2019-09-07 NOTE — Telephone Encounter (Signed)
New message   Patient calling status of FMLA forms

## 2019-09-21 NOTE — Telephone Encounter (Signed)
Spoke to patient's wife.She stated husband turned in [facility] paperwork 11/3.He was calling to see when he can pick up.Advised I will check with medical records to find out the status.

## 2019-09-21 NOTE — Telephone Encounter (Signed)
Follow up    Patient calling for status of FMLA paperwork

## 2019-09-28 ENCOUNTER — Telehealth: Payer: Self-pay

## 2019-09-28 NOTE — Telephone Encounter (Signed)
Spoke to patient's wife.I spoke to our medical records about recent FMLA paperwork.They have not received paperwork back.Advised you can call [employer] to check on the status at [phone].

## 2019-10-08 ENCOUNTER — Other Ambulatory Visit: Payer: Self-pay

## 2019-10-08 MED ORDER — METOPROLOL SUCCINATE ER 100 MG PO TB24: 100.0000 mg | ORAL_TABLET | Freq: Every day | ORAL | 0 refills | Status: DC

## 2019-11-16 ENCOUNTER — Ambulatory Visit: Payer: Medicare PPO | Attending: Internal Medicine

## 2019-11-16 ENCOUNTER — Other Ambulatory Visit: Payer: Self-pay

## 2019-11-16 DIAGNOSIS — Z23 Encounter for immunization: Secondary | ICD-10-CM | POA: Insufficient documentation

## 2019-11-16 NOTE — Progress Notes (Signed)
   Covid-19 Vaccination Clinic  Name:  Dustin Hess    MRN: [id number] DOB: May 14, 1946  11/16/2019  [name] was observed post Covid-19 immunization for 15 minutes without incidence. He was provided with Vaccine Information Sheet and instruction to access the V-Safe system.   [name] was instructed to call 911 with any severe reactions post vaccine: Marland Kitchen Difficulty breathing  . Swelling of your face and throat  . A fast heartbeat  . A bad rash all over your body  . Dizziness and weakness    Immunizations Administered    Name Date Dose VIS Date Route   Moderna COVID-19 Vaccine 11/16/2019 11:49 AM 0.5 mL 08/31/2019 Intramuscular   Manufacturer: Moderna   Lot: [id number]   NDC: 96722-773-75

## 2019-11-23 ENCOUNTER — Other Ambulatory Visit: Payer: Self-pay

## 2019-11-23 MED ORDER — ATORVASTATIN CALCIUM 20 MG PO TABS: ORAL_TABLET | ORAL | 2 refills | Status: DC

## 2019-12-15 ENCOUNTER — Ambulatory Visit: Payer: Medicare Other | Attending: Internal Medicine

## 2019-12-15 DIAGNOSIS — Z23 Encounter for immunization: Secondary | ICD-10-CM

## 2019-12-15 NOTE — Progress Notes (Signed)
   Covid-19 Vaccination Clinic  Name:  Dustin Hess    MRN: [id number] DOB: 06/20/1946  12/15/2019  Dustin Hess was observed post Covid-19 immunization for 15 minutes without incident. He was provided with Vaccine Information Sheet and instruction to access the V-Safe system.   Dustin Hess was instructed to call 911 with any severe reactions post vaccine: Marland Kitchen Difficulty breathing  . Swelling of face and throat  . A fast heartbeat  . A bad rash all over body  . Dizziness and weakness   Immunizations Administered    Name Date Dose VIS Date Route   Moderna COVID-19 Vaccine 12/15/2019 10:26 AM 0.5 mL 08/31/2019 Intramuscular   Manufacturer: Moderna   Lot: [id number]   NDC: 55015-868-25

## 2019-12-23 ENCOUNTER — Other Ambulatory Visit: Payer: Self-pay | Admitting: Cardiology

## 2019-12-28 ENCOUNTER — Other Ambulatory Visit: Payer: Self-pay | Admitting: Cardiology

## 2019-12-28 NOTE — Telephone Encounter (Signed)
*  STAT* If patient is at the pharmacy, call can be transferred to refill team.   1. Which medications need to be refilled? (please list name of each medication and dose if known)  isosorbide mononitrate (IMDUR) 120 MG 24 hr tablet nitroGLYCERIN (NITROSTAT) 0.4 MG SL tablet  2. Which pharmacy/location (including street and city if local pharmacy) is medication to be sent to? [id number] - [place], [place] - [address]  3. Do they need a 30 day or 90 day supply? 90 day supply   Patient is out of Nitroglycerin and only has two tablets of isosorbide mononitrate left.

## 2019-12-29 MED ORDER — NITROGLYCERIN 0.4 MG SL SUBL: 0.4000 mg | SUBLINGUAL_TABLET | SUBLINGUAL | 0 refills | Status: DC | PRN

## 2019-12-29 MED ORDER — ISOSORBIDE MONONITRATE ER 120 MG PO TB24: 120.0000 mg | ORAL_TABLET | Freq: Every day | ORAL | 0 refills | Status: DC

## 2020-01-27 ENCOUNTER — Other Ambulatory Visit: Payer: Self-pay | Admitting: Cardiology

## 2020-01-27 NOTE — Telephone Encounter (Signed)
° ° °*  STAT* If patient is at the pharmacy, call can be transferred to refill team.   1. Which medications need to be refilled? (please list name of each medication and dose if known) metoprolol succinate (TOPROL-XL) 100 MG 24 hr tablet, isosorbide mononitrate (IMDUR) 120 MG 24 hr tablet  2. Which pharmacy/location (including street and city if local pharmacy) is medication to be sent to? [id number] - [place], [place] - [address]  3. Do they need a 30 day or 90 day supply? 90 days

## 2020-01-28 MED ORDER — METOPROLOL SUCCINATE ER 100 MG PO TB24: 100.0000 mg | ORAL_TABLET | Freq: Every day | ORAL | 3 refills | Status: DC

## 2020-01-28 MED ORDER — ISOSORBIDE MONONITRATE ER 120 MG PO TB24: 120.0000 mg | ORAL_TABLET | Freq: Every day | ORAL | 3 refills | Status: DC

## 2020-04-24 DIAGNOSIS — R3915 Urgency of urination: Secondary | ICD-10-CM | POA: Diagnosis not present

## 2020-04-26 ENCOUNTER — Other Ambulatory Visit: Payer: Self-pay | Admitting: Cardiology

## 2020-04-26 DIAGNOSIS — I10 Essential (primary) hypertension: Secondary | ICD-10-CM

## 2020-05-18 DIAGNOSIS — I10 Essential (primary) hypertension: Secondary | ICD-10-CM | POA: Diagnosis not present

## 2020-05-18 DIAGNOSIS — E0789 Other specified disorders of thyroid: Secondary | ICD-10-CM | POA: Diagnosis not present

## 2020-07-31 NOTE — Progress Notes (Signed)
Cardiology Office Note   Date:  08/02/2020   ID:  Dustin Hess, DOB [birth year], MRN [id number]  PCP:  Renaye Rakers, MD  Cardiologist:   Rollene Rotunda, MD   Chief Complaint  Patient presents with  . Coronary Artery Disease      History of Present Illness: Dustin Hess is a 74 y.o. male who presents for followup of his known coronary disease. Since I last saw him he has done well.  He still works at [employer] for [employer].  He rarely has to take a nitroglycerin and this has been a stable pattern. The patient denies any new symptoms such as neck or arm discomfort. There has been no new shortness of breath, PND or orthopnea. There have been no reported palpitations, presyncope or syncope.  Of note he has had no change in symptoms since his negative perfusion study late last year.    Past Medical History:  Diagnosis Date  . Anemia, unspecified 12/22/2013  . Arthritis   . Benign prostatic hypertrophy   . Congenital blindness   . Coronary artery disease    a. 06/2004 Inf STEMI with PCI/BMS to RCA/LCX;  b. Cath 2014 40-50% focal bradycardia circ stenosis with a patent stent, RCA 50% stenosis, distal 60% stenosis in a previously stented area. 06/2011 Neg MV, EF 67%.  . Dyslipidemia   . GERD (gastroesophageal reflux disease)   . GI bleeding 2006  . Hypertension   . Hypothyroidism   . Leukopenia 12/22/2013    Past Surgical History:  Procedure Laterality Date  . COLONOSCOPY    . CORONARY ANGIOPLASTY WITH STENT PLACEMENT    . ESOPHAGOGASTRODUODENOSCOPY    . HEMORRHOID SURGERY    . LEFT HEART CATHETERIZATION WITH CORONARY ANGIOGRAM N/A 04/30/2013   Procedure: LEFT HEART CATHETERIZATION WITH CORONARY ANGIOGRAM;  Surgeon: Tonny Bollman, MD;  Location: [facility];  Service: Cardiovascular;  Laterality: N/A;     Current Outpatient Medications  Medication Sig Dispense Refill  . acetaminophen (ARTHRITIS PAIN RELIEVER) 650 MG CR tablet Take 650 mg by mouth every  8 (eight) hours as needed for pain.    Marland Kitchen aspirin 81 MG tablet Take 81 mg by mouth daily.      Marland Kitchen atorvastatin (LIPITOR) 20 MG tablet TAKE 1 TABLET (20 MG TOTAL) BY MOUTH DAILY AT 6 PM. 90 tablet 2  . Cholecalciferol (VITAMIN D3) 1000 UNITS CAPS Take 1 capsule by mouth daily.      Marland Kitchen dutasteride (AVODART) 0.5 MG capsule Take 0.5 mg by mouth daily.      . ferrous sulfate 325 (65 FE) MG tablet Take 325 mg by mouth 2 (two) times daily with a meal.    . isosorbide mononitrate (IMDUR) 120 MG 24 hr tablet Take 1 tablet (120 mg total) by mouth daily. 90 tablet 3  . levothyroxine (SYNTHROID, LEVOTHROID) 112 MCG tablet Take 112 mcg by mouth daily before breakfast.    . losartan (COZAAR) 100 MG tablet Take 1 tablet (100 mg total) by mouth daily. 90 tablet 3  . metoprolol succinate (TOPROL-XL) 100 MG 24 hr tablet Take 1 tablet (100 mg total) by mouth daily. Take with or immediately following a meal. 90 tablet 3  . Multiple Vitamin (MULTIVITAMIN) tablet Take 1 tablet by mouth daily.    . Tamsulosin HCl (FLOMAX) 0.4 MG CAPS Take 0.4 mg by mouth daily.      Marland Kitchen triamterene-hydrochlorothiazide (DYAZIDE) 37.5-25 MG capsule Take 1 each (1 capsule total) by mouth daily. 30 capsule  0  . losartan (COZAAR) 50 MG tablet TAKE 1 TABLET BY MOUTH EVERY DAY (Patient not taking: Reported on 08/02/2020) 90 tablet 3  . nitroGLYCERIN (NITROSTAT) 0.4 MG SL tablet Place 1 tablet (0.4 mg total) under the tongue every 5 (five) minutes as needed. Must call and schedule appt for future refills 25 tablet 6  . omeprazole (PRILOSEC) 20 MG capsule Take 20 mg by mouth daily.   (Patient not taking: Reported on 08/02/2020)     No current facility-administered medications for this visit.    Allergies:   Benadryl [diphenhydramine hcl]    ROS:  Please see the history of present illness.   Otherwise, review of systems are positive for none.   All other systems are reviewed and negative.    PHYSICAL EXAM: VS:  BP 122/70 (BP Location: Left Arm,  Patient Position: Sitting)   Pulse 71   Ht 5\' 5"  (1.651 m)   Wt 157 lb 12.8 oz (71.6 kg)   SpO2 97%   BMI 26.26 kg/m  , BMI Body mass index is 26.26 kg/m. GENERAL:  Well appearing NECK:  No jugular venous distention, waveform within normal limits, carotid upstroke brisk and symmetric, no bruits, no thyromegaly LUNGS:  Clear to auscultation bilaterally CHEST:  Unremarkable HEART:  PMI not displaced or sustained,S1 and S2 within normal limits, no S3, no S4, no clicks, no rubs, no murmurs ABD:  Flat, positive bowel sounds normal in frequency in pitch, no bruits, no rebound, no guarding, no midline pulsatile mass, no hepatomegaly, no splenomegaly EXT:  2 plus pulses throughout, no edema, no cyanosis no clubbing   EKG:  EKG is n ordered today. The ekg ordered  demonstrates sinus rhythm with premature atrial contractions.  Possible right atrial enlargement, no acute ST-T wave changes.  Short PR interval   Recent Labs: 08/09/2019: ALT 13; BUN 17; Creatinine, Ser 1.16; Potassium 4.6; Sodium 147    Lipid Panel    Component Value Date/Time   CHOL 109 08/09/2019 0830   TRIG 80 08/09/2019 0830   HDL 33 (L) 08/09/2019 0830   CHOLHDL 3.3 08/09/2019 0830   CHOLHDL 4 07/01/2012 1009   VLDL 15.0 07/01/2012 1009   LDLCALC 60 08/09/2019 0830      Wt Readings from Last 3 Encounters:  08/02/20 157 lb 12.8 oz (71.6 kg)  08/10/19 164 lb (74.4 kg)  08/02/19 164 lb (74.4 kg)      Other studies Reviewed: Additional studies/ records that were reviewed today include: Labs. Review of the above records demonstrates:  Please see elsewhere in the note.     ASSESSMENT AND PLAN:  CAD:  The patient has no new sypmtoms.  No further cardiovascular testing is indicated.  We will continue with aggressive risk reduction and meds as listed.  HTN: His blood pressure is well controlled.  No change in therapy.   DYSLIPIDEMIA:   LDL was 51 with an HDL of 30.  No change in therapy.  PACs:  No change in  therapy.  He has not noticed these.  Current medicines are reviewed at length with the patient today.  The patient does not have concerns regarding medicines.  The following changes have been made:  None  Labs/ tests ordered today include: None  Orders Placed This Encounter  Procedures  . EKG 12-Lead     Disposition:   FU with me in one year.    Signed, 13/02/20, MD  08/02/2020 11:42 AM    [facility]

## 2020-08-02 ENCOUNTER — Encounter: Payer: Self-pay | Admitting: Cardiology

## 2020-08-02 ENCOUNTER — Other Ambulatory Visit: Payer: Self-pay

## 2020-08-02 ENCOUNTER — Ambulatory Visit (INDEPENDENT_AMBULATORY_CARE_PROVIDER_SITE_OTHER): Payer: Medicare Other | Admitting: Cardiology

## 2020-08-02 VITALS — BP 122/70 | HR 71 | Ht 65.0 in | Wt 157.8 lb

## 2020-08-02 DIAGNOSIS — I10 Essential (primary) hypertension: Secondary | ICD-10-CM

## 2020-08-02 DIAGNOSIS — E785 Hyperlipidemia, unspecified: Secondary | ICD-10-CM

## 2020-08-02 DIAGNOSIS — I2510 Atherosclerotic heart disease of native coronary artery without angina pectoris: Secondary | ICD-10-CM

## 2020-08-02 MED ORDER — NITROGLYCERIN 0.4 MG SL SUBL: 0.4000 mg | SUBLINGUAL_TABLET | SUBLINGUAL | 6 refills | Status: DC | PRN

## 2020-08-02 NOTE — Patient Instructions (Signed)
Medication Instructions:   No changes   *If you need a refill on your cardiac medications before your next appointment, please call your pharmacy*   Lab Work:  Not needed     Testing/Procedures:  Not needed  Follow-Up: At [facility], you and your health needs are our priority.  As part of our continuing mission to provide you with exceptional heart care, we have created designated Provider Care Teams.  These Care Teams include your primary Cardiologist (physician) and Advanced Practice Providers (APPs -  Physician Assistants and Nurse Practitioners) who all work together to provide you with the care you need, when you need it.  We recommend signing up for the patient portal called "MyChart".  Sign up information is provided on this After Visit Summary.  MyChart is used to connect with patients for Virtual Visits (Telemedicine).  Patients are able to view lab/test results, encounter notes, upcoming appointments, etc.  Non-urgent messages can be sent to your provider as well.   To learn more about what you can do with MyChart, go to [email or web address].    Your next appointment:   12 month(s)  The format for your next appointment:   In Person  Provider:   James Hochrein, MD     

## 2020-10-20 ENCOUNTER — Other Ambulatory Visit: Payer: Self-pay | Admitting: Cardiology

## 2020-10-20 DIAGNOSIS — I10 Essential (primary) hypertension: Secondary | ICD-10-CM

## 2021-01-18 ENCOUNTER — Other Ambulatory Visit: Payer: Self-pay | Admitting: Cardiology

## 2021-05-08 DIAGNOSIS — R3915 Urgency of urination: Secondary | ICD-10-CM | POA: Diagnosis not present

## 2021-05-21 DIAGNOSIS — K119 Disease of salivary gland, unspecified: Secondary | ICD-10-CM | POA: Diagnosis not present

## 2021-05-21 DIAGNOSIS — I119 Hypertensive heart disease without heart failure: Secondary | ICD-10-CM | POA: Diagnosis not present

## 2021-05-21 DIAGNOSIS — E0789 Other specified disorders of thyroid: Secondary | ICD-10-CM | POA: Diagnosis not present

## 2021-05-21 DIAGNOSIS — M549 Dorsalgia, unspecified: Secondary | ICD-10-CM | POA: Diagnosis not present

## 2021-05-21 DIAGNOSIS — I10 Essential (primary) hypertension: Secondary | ICD-10-CM | POA: Diagnosis not present

## 2021-05-21 DIAGNOSIS — H540X55 Blindness right eye category 5, blindness left eye category 5: Secondary | ICD-10-CM | POA: Diagnosis not present

## 2021-05-21 DIAGNOSIS — E782 Mixed hyperlipidemia: Secondary | ICD-10-CM | POA: Diagnosis not present

## 2021-06-07 ENCOUNTER — Ambulatory Visit
Admission: RE | Admit: 2021-06-07 | Discharge: 2021-06-07 | Disposition: A | Discharge status: Home / Self Care | Payer: Medicare Other | Source: Ambulatory Visit | Attending: Family Medicine | Admitting: Family Medicine

## 2021-06-07 ENCOUNTER — Other Ambulatory Visit: Payer: Self-pay | Admitting: Family Medicine

## 2021-06-07 DIAGNOSIS — R52 Pain, unspecified: Secondary | ICD-10-CM

## 2021-06-07 DIAGNOSIS — M545 Low back pain, unspecified: Secondary | ICD-10-CM | POA: Diagnosis not present

## 2021-07-05 ENCOUNTER — Other Ambulatory Visit: Payer: Self-pay | Admitting: Cardiology

## 2021-07-17 DIAGNOSIS — H543 Unqualified visual loss, both eyes: Secondary | ICD-10-CM | POA: Diagnosis not present

## 2021-07-17 DIAGNOSIS — E785 Hyperlipidemia, unspecified: Secondary | ICD-10-CM | POA: Diagnosis not present

## 2021-07-17 DIAGNOSIS — I2510 Atherosclerotic heart disease of native coronary artery without angina pectoris: Secondary | ICD-10-CM | POA: Diagnosis not present

## 2021-07-17 DIAGNOSIS — I10 Essential (primary) hypertension: Secondary | ICD-10-CM | POA: Diagnosis not present

## 2021-07-17 DIAGNOSIS — I700 Atherosclerosis of aorta: Secondary | ICD-10-CM | POA: Diagnosis not present

## 2021-07-17 DIAGNOSIS — E039 Hypothyroidism, unspecified: Secondary | ICD-10-CM | POA: Diagnosis not present

## 2021-07-17 DIAGNOSIS — M5136 Other intervertebral disc degeneration, lumbar region: Secondary | ICD-10-CM | POA: Diagnosis not present

## 2021-07-17 DIAGNOSIS — Z23 Encounter for immunization: Secondary | ICD-10-CM | POA: Diagnosis not present

## 2021-07-17 DIAGNOSIS — Z79899 Other long term (current) drug therapy: Secondary | ICD-10-CM | POA: Diagnosis not present

## 2021-07-17 DIAGNOSIS — I252 Old myocardial infarction: Secondary | ICD-10-CM | POA: Diagnosis not present

## 2021-07-24 ENCOUNTER — Other Ambulatory Visit: Payer: Self-pay | Admitting: Cardiology

## 2021-09-14 NOTE — Progress Notes (Signed)
Cardiology Office Note   Date:  09/17/2021   ID:  Dustin Hess, DOB November 14, 1945, MRN [id number]  PCP:  Dustin Rakers, MD  Cardiologist:   Rollene Rotunda, MD   Chief Complaint  Patient presents with   Coronary Artery Disease       History of Present Illness: Dustin Hess is a 75 y.o. male who presents for followup of his known coronary disease.     He returns today for follow-up.  He did have chest discomfort once about a week ago.  This was similar to the symptoms that he had before.  It is a stable anginal pattern.  He takes 1 or 2 nitroglycerin and it goes away.  It happens a couple times per year.  He is not saying that happens with any increased frequency or intensity.  He gets some chest and left arm discomfort.  He will get hot and sweaty.  He is finally retired after 55 years with the [employer].  He is not doing as much physical activity as he was.   Past Medical History:  Diagnosis Date   Anemia, unspecified 12/22/2013   Arthritis    Benign prostatic hypertrophy    Congenital blindness    Coronary artery disease    a. 06/2004 Inf STEMI with PCI/BMS to RCA/LCX;  b. Cath 2014 40-50% focal bradycardia circ stenosis with a patent stent, RCA 50% stenosis, distal 60% stenosis in a previously stented area. 06/2011 Neg MV, EF 67%.   Dyslipidemia    GERD (gastroesophageal reflux disease)    GI bleeding 2006   Hypertension    Hypothyroidism    Leukopenia 12/22/2013    Past Surgical History:  Procedure Laterality Date   COLONOSCOPY     CORONARY ANGIOPLASTY WITH STENT PLACEMENT     ESOPHAGOGASTRODUODENOSCOPY     HEMORRHOID SURGERY     LEFT HEART CATHETERIZATION WITH CORONARY ANGIOGRAM N/A 04/30/2013   Procedure: LEFT HEART CATHETERIZATION WITH CORONARY ANGIOGRAM;  Surgeon: Tonny Bollman, MD;  Location: [facility] CATH LAB;  Service: Cardiovascular;  Laterality: N/A;     Current Outpatient Medications  Medication Sig Dispense Refill    acetaminophen (TYLENOL) 650 MG CR tablet Take 650 mg by mouth every 8 (eight) hours as needed for pain.     amLODipine (NORVASC) 5 MG tablet Take 1 tablet (5 mg total) by mouth daily. 90 tablet 3   aspirin 81 MG tablet Take 81 mg by mouth daily.       atorvastatin (LIPITOR) 20 MG tablet TAKE 1 TABLET BY MOUTH DAILY AT 6 PM. 90 tablet 0   Cholecalciferol (VITAMIN D3) 1000 UNITS CAPS Take 1 capsule by mouth daily.       dutasteride (AVODART) 0.5 MG capsule Take 0.5 mg by mouth daily.       ferrous sulfate 325 (65 FE) MG tablet Take 325 mg by mouth 2 (two) times daily with a meal.     isosorbide mononitrate (IMDUR) 120 MG 24 hr tablet TAKE 1 TABLET (120 MG TOTAL) BY MOUTH DAILY. 90 tablet 3   levothyroxine (SYNTHROID, LEVOTHROID) 112 MCG tablet Take 112 mcg by mouth daily before breakfast.     losartan (COZAAR) 100 MG tablet TAKE 1 TABLET BY MOUTH EVERY DAY 90 tablet 3   metoprolol succinate (TOPROL-XL) 100 MG 24 hr tablet TAKE 1 TABLET (100 MG TOTAL) BY MOUTH DAILY. TAKE WITH OR IMMEDIATELY FOLLOWING A MEAL. 90 tablet 3   Multiple Vitamin (MULTIVITAMIN) tablet Take 1 tablet  by mouth daily.     nitroGLYCERIN (NITROSTAT) 0.4 MG SL tablet Place 1 tablet (0.4 mg total) under the tongue every 5 (five) minutes as needed. Must call and schedule appt for future refills 25 tablet 6   omeprazole (PRILOSEC) 20 MG capsule Take 20 mg by mouth daily.     tamsulosin (FLOMAX) 0.4 MG CAPS capsule Take 0.4 mg by mouth daily.       triamterene-hydrochlorothiazide (DYAZIDE) 37.5-25 MG capsule Take 1 each (1 capsule total) by mouth daily. 30 capsule 0   No current facility-administered medications for this visit.    Allergies:   Benadryl [diphenhydramine hcl]    ROS:  Please see the history of present illness.   Otherwise, review of systems are positive for none.   All other systems are reviewed and negative.    PHYSICAL EXAM: VS:  BP (!) 195/98    Pulse 75    Ht 5\' 1"  (1.549 m)    Wt 158 lb 6.4 oz (71.8 kg)     SpO2 99%    BMI 29.93 kg/m  , BMI Body mass index is 29.93 kg/m. GENERAL:  Well appearing NECK:  No jugular venous distention, waveform within normal limits, carotid upstroke brisk and symmetric, no bruits, no thyromegaly LUNGS:  Clear to auscultation bilaterally CHEST:  Unremarkable HEART:  PMI not displaced or sustained,S1 and S2 within normal limits, no S3, no S4, no clicks, no rubs, no murmurs ABD:  Flat, positive bowel sounds normal in frequency in pitch, no bruits, no rebound, no guarding, no midline pulsatile mass, no hepatomegaly, no splenomegaly EXT:  2 plus pulses throughout, no edema, no cyanosis no clubbing   EKG:  EKG is  ordered today. The ekg ordered  demonstrates sinus rhythm, rate 75 possible right atrial enlargement, no acute ST-T wave changes.  Short PR interval   Recent Labs: No results found for requested labs within last 8760 hours.    Lipid Panel    Component Value Date/Time   CHOL 109 08/09/2019 0830   TRIG 80 08/09/2019 0830   HDL 33 (L) 08/09/2019 0830   CHOLHDL 3.3 08/09/2019 0830   CHOLHDL 4 07/01/2012 1009   VLDL 15.0 07/01/2012 1009   LDLCALC 60 08/09/2019 0830      Wt Readings from Last 3 Encounters:  09/17/21 158 lb 6.4 oz (71.8 kg)  08/02/20 157 lb 12.8 oz (71.6 kg)  08/10/19 164 lb (74.4 kg)      Other studies Reviewed: Additional studies/ records that were reviewed today include: Labs. Review of the above records demonstrates:  Please see elsewhere in the note.     ASSESSMENT AND PLAN:  CAD: He has a stable anginal pattern.  No change in therapy.  He would let me know if this increases in frequency or intensity.  HTN: His blood pressure is not controlled.  It does not look like he has been taking the triamterene hydrochlorothiazide and that is off his list.  We will need to clarify.  I am going to add amlodipine 5 mg daily.  He has an audible blood pressure cuff (he is legally blind) and I am going to have him enrolled in our  Pharm  [facility] HTN Clinic to follow and manage his blood pressure.    DYSLIPIDEMIA:   LDL was 58.  No change in therapy.    PACs: He does not feel these.  No change in therapy.   Current medicines are reviewed at length with the patient today.  The  patient does not have concerns regarding medicines.  The following changes have been made: As above  Labs/ tests ordered today include: None  Orders Placed This Encounter  Procedures   EKG 12-Lead      Disposition:   FU with me HTN Clinic.    Signed, Rollene Rotunda, MD  09/17/2021 9:34 AM    [facility]

## 2021-09-17 ENCOUNTER — Other Ambulatory Visit: Payer: Self-pay

## 2021-09-17 ENCOUNTER — Ambulatory Visit: Payer: Medicare Other | Admitting: Cardiology

## 2021-09-17 ENCOUNTER — Encounter: Payer: Self-pay | Admitting: Cardiology

## 2021-09-17 ENCOUNTER — Telehealth: Payer: Self-pay

## 2021-09-17 VITALS — BP 195/98 | HR 75 | Ht 61.0 in | Wt 158.4 lb

## 2021-09-17 DIAGNOSIS — I10 Essential (primary) hypertension: Secondary | ICD-10-CM | POA: Diagnosis not present

## 2021-09-17 DIAGNOSIS — Z1589 Genetic susceptibility to other disease: Secondary | ICD-10-CM

## 2021-09-17 DIAGNOSIS — I25708 Atherosclerosis of coronary artery bypass graft(s), unspecified, with other forms of angina pectoris: Secondary | ICD-10-CM | POA: Diagnosis not present

## 2021-09-17 DIAGNOSIS — E785 Hyperlipidemia, unspecified: Secondary | ICD-10-CM | POA: Diagnosis not present

## 2021-09-17 DIAGNOSIS — I2510 Atherosclerotic heart disease of native coronary artery without angina pectoris: Secondary | ICD-10-CM

## 2021-09-17 MED ORDER — AMLODIPINE BESYLATE 5 MG PO TABS: 5.0000 mg | ORAL_TABLET | Freq: Every day | ORAL | 3 refills | Status: DC

## 2021-09-17 NOTE — Telephone Encounter (Signed)
-----   Message from Olene Floss, RPH-CPP sent at 09/17/2021 10:35 AM EST ----- Regarding: FW: htn consult Looks like a NL pt- please schedule thanks ----- Message ----- From: Melina Modena, RN Sent: 09/17/2021   9:13 AM EST To: [facility] Pharm[facility] Subject: htn consult                                    Please schedule Dustin Hess in one month. Thank you.

## 2021-09-17 NOTE — Patient Instructions (Addendum)
Medication Instructions:  Start taking amlodipine 5 mg each day.  *If you need a refill on your cardiac medications before your next appointment, please call your pharmacy*   Follow-Up: At [facility], you and your health needs are our priority.  As part of our continuing mission to provide you with exceptional heart care, we have created designated Provider Care Teams.  These Care Teams include your primary Cardiologist (physician) and Advanced Practice Providers (APPs -  Physician Assistants and Nurse Practitioners) who all work together to provide you with the care you need, when you need it.  We recommend signing up for the patient portal called "MyChart".  Sign up information is provided on this After Visit Summary.  MyChart is used to connect with patients for Virtual Visits (Telemedicine).  Patients are able to view lab/test results, encounter notes, upcoming appointments, etc.  Non-urgent messages can be sent to your provider as well.   To learn more about what you can do with MyChart, go to [email or web address].    Your next appointment:   6 month(s)  The format for your next appointment:   In Person  Provider:   APP   Other Instructions You will be enrolled with a pharmacist regarding your hypertension.

## 2021-09-17 NOTE — Telephone Encounter (Signed)
Called and scheduled appt for 1 month from today since they saw hochrein today and they can followup w/pharmd regarding htn concerns

## 2021-10-02 ENCOUNTER — Other Ambulatory Visit (HOSPITAL_COMMUNITY): Payer: Self-pay | Admitting: Family Medicine

## 2021-10-02 DIAGNOSIS — I10 Essential (primary) hypertension: Secondary | ICD-10-CM

## 2021-10-03 ENCOUNTER — Ambulatory Visit (HOSPITAL_COMMUNITY)
Admission: RE | Admit: 2021-10-03 | Discharge: 2021-10-03 | Disposition: A | Discharge status: Home / Self Care | Payer: Medicare Other | Source: Ambulatory Visit | Attending: Family Medicine | Admitting: Family Medicine

## 2021-10-03 ENCOUNTER — Other Ambulatory Visit: Payer: Self-pay

## 2021-10-03 DIAGNOSIS — E785 Hyperlipidemia, unspecified: Secondary | ICD-10-CM | POA: Diagnosis not present

## 2021-10-03 DIAGNOSIS — I10 Essential (primary) hypertension: Secondary | ICD-10-CM | POA: Insufficient documentation

## 2021-10-03 DIAGNOSIS — I358 Other nonrheumatic aortic valve disorders: Secondary | ICD-10-CM | POA: Diagnosis not present

## 2021-10-03 LAB — ECHOCARDIOGRAM COMPLETE
Area-P 1/2: 2.87 cm2
S' Lateral: 2.6 cm

## 2021-10-16 ENCOUNTER — Other Ambulatory Visit: Payer: Self-pay | Admitting: Cardiology

## 2021-10-16 DIAGNOSIS — I10 Essential (primary) hypertension: Secondary | ICD-10-CM

## 2021-10-18 ENCOUNTER — Other Ambulatory Visit: Payer: Self-pay

## 2021-10-18 ENCOUNTER — Ambulatory Visit: Payer: Medicare Other | Admitting: Pharmacist

## 2021-10-18 VITALS — BP 136/72 | HR 74 | Resp 16 | Ht 61.0 in | Wt 160.0 lb

## 2021-10-18 DIAGNOSIS — I10 Essential (primary) hypertension: Secondary | ICD-10-CM

## 2021-10-18 NOTE — Patient Instructions (Addendum)
It was nice meeting you today  We would like your blood pressure to be less than 130/80  Continue your: Losartan 100mg  daily Amlodipine 5mg  daily Isosorbide 120mg  daily Triamterene-HCTZ 37.5-25mg  daily Metoprolol succinate 100mg  daily  Have come over a few times a week and check your blood pressure and write it down.  We will see you back in 1 month and if blood pressure is still above your goal we can consider increasing your amlodipine  Please call with any questions!  , PharmD, BCACP, CDCES, CPP [address] [place], Corrie Dandy, [place] Phone: [phone], Fax: [phone]

## 2021-10-18 NOTE — Progress Notes (Signed)
Patient ID: Dustin Hess                 DOB: [birth year]                      MRN: [id number]     HPI: Dustin Hess is a 76 y.o. male referred by Dr. Ardeth Perfect to HTN clinic. PMH is significant for CAD, HTN, and HLD.    Patient presents today in good spirits. Reports back pain but in general he feels well. Uses Tylenol arthritis 8 hour.  Has a home health nurse named Corrie Dandy who comes over frequently to help him with his medications.  Currently on losartan, amlodipine, isosorbide, triamterene/hydrochlorothiazide and metoprolol. No patient reported adverse effects.  Wife does the majority of cooking at home and does not add salt.  Current HTN meds:  Losartan 100mg  daily Amlodipine 5mg  daily Isosorbide 120mg  daily Triamterene-HCTZ 37.5-25mg  daily Metoprolol succinate 100mg  daily  BP goal: <130/80  Wt Readings from Last 3 Encounters:  09/17/21 158 lb 6.4 oz (71.8 kg)  08/02/20 157 lb 12.8 oz (71.6 kg)  08/10/19 164 lb (74.4 kg)   BP Readings from Last 3 Encounters:  09/17/21 (!) 195/98  08/02/20 122/70  08/02/19 (!) 179/95   Pulse Readings from Last 3 Encounters:  09/17/21 75  08/02/20 71  08/02/19 69    Renal function: CrCl cannot be calculated (Patient's most recent lab result is older than the maximum 21 days allowed.).  Past Medical History:  Diagnosis Date   Anemia, unspecified 12/22/2013   Arthritis    Benign prostatic hypertrophy    Congenital blindness    Coronary artery disease    a. 06/2004 Inf STEMI with PCI/BMS to RCA/LCX;  b. Cath 2014 40-50% focal bradycardia circ stenosis with a patent stent, RCA 50% stenosis, distal 60% stenosis in a previously stented area. 06/2011 Neg MV, EF 67%.   Dyslipidemia    GERD (gastroesophageal reflux disease)    GI bleeding 2006   Hypertension    Hypothyroidism    Leukopenia 12/22/2013    Current Outpatient Medications on File Prior to Visit  Medication Sig Dispense Refill   acetaminophen (TYLENOL) 650  MG CR tablet Take 650 mg by mouth every 8 (eight) hours as needed for pain.     amLODipine (NORVASC) 5 MG tablet Take 1 tablet (5 mg total) by mouth daily. 90 tablet 3   aspirin 81 MG tablet Take 81 mg by mouth daily.       atorvastatin (LIPITOR) 20 MG tablet TAKE 1 TABLET BY MOUTH DAILY AT 6 PM. 90 tablet 3   Cholecalciferol (VITAMIN D3) 1000 UNITS CAPS Take 1 capsule by mouth daily.       dutasteride (AVODART) 0.5 MG capsule Take 0.5 mg by mouth daily.       ferrous sulfate 325 (65 FE) MG tablet Take 325 mg by mouth 2 (two) times daily with a meal.     isosorbide mononitrate (IMDUR) 120 MG 24 hr tablet TAKE 1 TABLET (120 MG TOTAL) BY MOUTH DAILY. 90 tablet 3   levothyroxine (SYNTHROID, LEVOTHROID) 112 MCG tablet Take 112 mcg by mouth daily before breakfast.     losartan (COZAAR) 100 MG tablet TAKE 1 TABLET BY MOUTH EVERY DAY 90 tablet 3   metoprolol succinate (TOPROL-XL) 100 MG 24 hr tablet TAKE 1 TABLET (100 MG TOTAL) BY MOUTH DAILY. TAKE WITH OR IMMEDIATELY FOLLOWING A MEAL. 90 tablet 3   Multiple Vitamin (MULTIVITAMIN) tablet Take  1 tablet by mouth daily.     nitroGLYCERIN (NITROSTAT) 0.4 MG SL tablet Place 1 tablet (0.4 mg total) under the tongue every 5 (five) minutes as needed. Must call and schedule appt for future refills 25 tablet 6   omeprazole (PRILOSEC) 20 MG capsule Take 20 mg by mouth daily.     tamsulosin (FLOMAX) 0.4 MG CAPS capsule Take 0.4 mg by mouth daily.       triamterene-hydrochlorothiazide (DYAZIDE) 37.5-25 MG capsule Take 1 each (1 capsule total) by mouth daily. 30 capsule 0   No current facility-administered medications on file prior to visit.    Allergies  Allergen Reactions   Benadryl [Diphenhydramine Hcl] Palpitations    2014  Took one tablet and had heart palpitations     Assessment/Plan:  1. Hypertension -  Patient BP is 136/72 which is improved but slightly above goal of <130/80. Unknown what home pressures are. Recommended he have Corrie Dandy take his BP at  home a few times a week and report back with results. If home readings above goal will consider increasing amlodipine.  Continue: Losartan 100mg  daily Amlodipine 5mg  daily Isosorbide 120mg  daily Triamterene-HCTZ 37.5-25mg  daily Metoprolol succinate 100mg  daily  [place], PharmD, BCACP, CDCES, CPP [address], [address] [place], [place], [address] Phone: [phone], Fax: [phone]

## 2021-11-15 ENCOUNTER — Ambulatory Visit: Payer: Medicare Other

## 2021-12-13 ENCOUNTER — Ambulatory Visit: Payer: Medicare Other | Admitting: Pharmacist Clinician (PhC)/ Clinical Pharmacy Specialist

## 2021-12-13 ENCOUNTER — Other Ambulatory Visit: Payer: Self-pay

## 2021-12-13 DIAGNOSIS — I10 Essential (primary) hypertension: Secondary | ICD-10-CM | POA: Diagnosis not present

## 2021-12-13 NOTE — Progress Notes (Signed)
? ? ? ?12/17/2021 ?[name] ?April 08, 1946 ?[id number] ? ? ?HPI:  Dustin Hess is a 76 y.o. male patient of Dr Antoine Poche, with a PMH below who presents today for hypertension clinic evaluation.  He was seen by Dr. Antoine Poche in December, at which time his pressure was noted to be 195/98.  There was some confusion about whether he was taking the triamterene/hctz.  He was encouraged to resume, as well as starting amlodipine 5 mg daily.  At a follow up in January his pressure was much improved, down to 136/72.  He reported compliance with medications.   ? ?Today he is in the office with his wife.  He is blind and they note that his wife gives him his medications each day.  He has an aide that comes in some days, and among other things, she checks his blood pressure.   They have not been writing down any of these numbers.    ? 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  ? ?Blood Pressure Goal:  130/80 ? ?Current Medications:  Losartan 100mg  daily ?Amlodipine 5mg  daily ?Isosorbide 120mg  daily ?Triamterene-HCTZ 37.5-25mg  daily ?Metoprolol succinate 100mg  daily ? ?Family Hx: both parents with hypertension, neither died from heart disease, both cancer (leukemia, kidney); siblings all with hypertension (9 total siblings); 3 children - two with hypertension ? ?Social Hx: no tobacco, no alcohol, 1 coffee per day (home brewed) ? ?Diet: home cooked meals, only occasionally eats out; no added salt when cooking, good mix of meats, vegetables with all meals - mostly frozen, some fresh ? ?Exercise: walks laps around yard as weather permits, plans to start chair aerobics ? ?Home BP readings: no readings with him today, notes most home readings are 130-140 systolic range, diastolic usually < 80 ? ?Intolerances: no cardiac medication intolerances ? ?Labs:  2/23:  Na 142, K 4.4, Glu 85, BUN 19, SCr  1.03 ? ? ?Wt Readings from Last 3 Encounters:  ?12/13/21 159 lb (72.1 kg)  ?10/18/21 160 lb (72.6 kg)  ?09/17/21 158 lb 6.4 oz (71.8 kg)  ? ?BP Readings from Last 3 Encounters:  ?12/13/21 120/71  ?10/18/21 136/72  ?09/17/21 (!) 195/98  ? ?Pulse Readings from Last 3 Encounters:  ?12/13/21 94  ?10/18/21 74  ?09/17/21 75  ? ? ?Current Outpatient Medications  ?Medication Sig Dispense Refill  ? amLODipine (NORVASC) 5 MG tablet Take 1 tablet (5 mg total) by mouth daily. 90 tablet 3  ? aspirin 81 MG tablet Take 81 mg by mouth daily.      ? atorvastatin (LIPITOR) 20 MG tablet TAKE 1 TABLET BY MOUTH DAILY AT 6 PM. 90 tablet 3  ? Cholecalciferol (VITAMIN D3) 1000 UNITS CAPS Take 1 capsule by mouth daily.      ? dutasteride (AVODART) 0.5 MG capsule Take 0.5 mg by mouth daily.      ? isosorbide mononitrate (IMDUR) 120 MG 24 hr tablet TAKE 1 TABLET (120 MG TOTAL) BY MOUTH DAILY. 90 tablet 3  ? levothyroxine (SYNTHROID, LEVOTHROID) 112 MCG tablet Take 112 mcg by mouth daily before breakfast.    ? losartan (COZAAR) 100 MG tablet TAKE 1 TABLET BY MOUTH EVERY DAY 90 tablet 3  ? metoprolol succinate (TOPROL-XL) 100 MG 24 hr tablet TAKE 1 TABLET (100 MG TOTAL) BY MOUTH DAILY. TAKE WITH OR IMMEDIATELY FOLLOWING A MEAL. 90 tablet 3  ? Multiple Vitamin (MULTIVITAMIN) tablet  Take 1 tablet by mouth daily.    ? nitroGLYCERIN (NITROSTAT) 0.4 MG SL tablet Place 1 tablet (0.4 mg total) under the tongue every 5 (five) minutes as needed. Must call and schedule appt for future refills 25 tablet 6  ? tamsulosin (FLOMAX) 0.4 MG CAPS capsule Take 0.4 mg by mouth daily.      ? triamterene-hydrochlorothiazide (DYAZIDE) 37.5-25 MG capsule Take 1 each (1 capsule total) by mouth daily. 30 capsule 0  ? acetaminophen (TYLENOL) 650 MG CR tablet Take 650 mg by mouth every 8 (eight) hours as needed for pain.    ? ?No current facility-administered medications for this visit.  ? ? ?Allergies  ?Allergen Reactions  ? Benadryl [Diphenhydramine Hcl] Palpitations  ?   2014  Took one tablet and had heart palpitations  ? ? ?Past Medical History:  ?Diagnosis Date  ? Anemia, unspecified 12/22/2013  ? Arthritis   ? Benign prostatic hypertrophy   ? Congenital blindness   ? Coronary artery disease   ? a. 06/2004 Inf STEMI with PCI/BMS to RCA/LCX;  b. Cath 2014 40-50% focal bradycardia circ stenosis with a patent stent, RCA 50% stenosis, distal 60% stenosis in a previously stented area. 06/2011 Neg MV, EF 67%.  ? Dyslipidemia   ? GERD (gastroesophageal reflux disease)   ? GI bleeding 2006  ? Hypertension   ? Hypothyroidism   ? Leukopenia 12/22/2013  ? ? ?Blood pressure 120/71, pulse 94, height 5\' 3"  (1.6 m), weight 159 lb (72.1 kg). ? ?Essential hypertension, benign ?Patient with essential hypertension, currently doing well with medication regimen.  Reviewed with patient as well as wife and aide, how to properly check BP readings.  Advised he continue to check readings when aide is with him and keep track.  Should his readings increase > 140/80 consistently he should report back to our office ? ? ? PharmD CPP [facility] ?[facility] ?[address] ?[place], [place] ?[phone] ?

## 2021-12-13 NOTE — Patient Instructions (Signed)
?  Check your blood pressure at home weekly and keep record of the readings. ? ?Take your BP meds as follows: ? Continue to take all your current medications ? ?Bring all of your meds, your BP cuff and your record of home blood pressures to your next appointment.  Exercise as you?re able, try to walk approximately 30 minutes per day.  Keep salt intake to a minimum, especially watch canned and prepared boxed foods.  Eat more fresh fruits and vegetables and fewer canned items.  Avoid eating in fast food restaurants.  ? ? HOW TO TAKE YOUR BLOOD PRESSURE: ?Rest 5 minutes before taking your blood pressure. ? Don?t smoke or drink caffeinated beverages for at least 30 minutes before. ?Take your blood pressure before (not after) you eat. ?Sit comfortably with your back supported and both feet on the floor (don?t cross your legs). ?Elevate your arm to heart level on a table or a desk. ?Use the proper sized cuff. It should fit smoothly and snugly around your bare upper arm. There should be enough room to slip a fingertip under the cuff. The bottom edge of the cuff should be 1 inch above the crease of the elbow. ?Ideally, take 3 measurements at one sitting and record the average. ? ? ?

## 2021-12-17 NOTE — Assessment & Plan Note (Signed)
Patient with essential hypertension, currently doing well with medication regimen.  Reviewed with patient as well as wife and aide, how to properly check BP readings.  Advised he continue to check readings when aide is with him and keep track.  Should his readings increase > 140/80 consistently he should report back to our office ?

## 2022-01-21 ENCOUNTER — Other Ambulatory Visit: Payer: Self-pay | Admitting: Cardiology

## 2022-03-01 ENCOUNTER — Ambulatory Visit: Payer: Medicare Other | Admitting: Adult Health

## 2022-03-04 NOTE — Progress Notes (Unsigned)
Cardiology Office Note:    Date:  03/05/2022   ID:  Dustin Hess, DOB 1946/03/24, MRN [id number]  PCP:  Ellyn Hack, MD [facility] Cardiologist: Rollene Rotunda, MD   Reason for visit: 23-month follow-up  History of Present Illness:    Dustin Hess is a 76 y.o. male with a hx of coronary disease with STEMI in 2005 status post PCI to RCA and LCx, dyslipidemia, hypertension, PACs.  He is blind and they note that his wife gives him his medications each day  He last saw Dr. Antoine Poche in December 2022.  Patient mentioned a couple anginal episodes per year that were stable and relieved with 1-2 nitroglycerin.  Angina episodes include chest and left arm discomfort and diaphoresis.  His blood pressure was elevated.  Amlodipine 5 mg was added with Pharm.D. follow-up.  BP was 136/72 and 120/71 at follow-up.  Today, patient states he is doing well.  Chest pain, shortness of breath, lightheadedness, palpitations in lower extremity edema.  They have a home health nurse that comes the house once a week and blood pressure has been consistently good.  No issues with his medications.  No bleeding.  He is planned to have eye removal soon.  His wife states she has been cooking healthier.     Past Medical History:  Diagnosis Date   Anemia, unspecified 12/22/2013   Arthritis    Benign prostatic hypertrophy    Congenital blindness    Coronary artery disease    a. 06/2004 Inf STEMI with PCI/BMS to RCA/LCX;  b. Cath 2014 40-50% focal bradycardia circ stenosis with a patent stent, RCA 50% stenosis, distal 60% stenosis in a previously stented area. 06/2011 Neg MV, EF 67%.   Dyslipidemia    GERD (gastroesophageal reflux disease)    GI bleeding 2006   Hypertension    Hypothyroidism    Leukopenia 12/22/2013    Past Surgical History:  Procedure Laterality Date   COLONOSCOPY     CORONARY ANGIOPLASTY WITH STENT PLACEMENT     ESOPHAGOGASTRODUODENOSCOPY     HEMORRHOID SURGERY      LEFT HEART CATHETERIZATION WITH CORONARY ANGIOGRAM N/A 04/30/2013   Procedure: LEFT HEART CATHETERIZATION WITH CORONARY ANGIOGRAM;  Surgeon: Tonny Bollman, MD;  Location: [facility] CATH LAB;  Service: Cardiovascular;  Laterality: N/A;    Current Medications: No outpatient medications have been marked as taking for the 03/05/22 encounter (Office Visit) with Cannon Kettle, PA-C.     Allergies:   Benadryl [diphenhydramine hcl]   Social History   Socioeconomic History   Marital status: Married    Spouse name: Not on file   Number of children: Not on file   Years of education: Not on file   Highest education level: Not on file  Occupational History   Not on file  Tobacco Use   Smoking status: Never   Smokeless tobacco: Never  Vaping Use   Vaping Use: Never used  Substance and Sexual Activity   Alcohol use: No   Drug use: No   Sexual activity: Not on file  Other Topics Concern   Not on file  Social History Narrative   Lives in [place] with your wife and dtr.  Works on [employer] for the [employer].  Does not routinely exercise.   Social Determinants of Health   Financial Resource Strain: Not on file  Food Insecurity: Not on file  Transportation Needs: Not on file  Physical Activity: Not on file  Stress: Not  on file  Social Connections: Not on file     Family History: The patient's family history includes Cancer (age of onset: 4376) in his mother; Cancer (age of onset: 283) in his father; Heart attack in his cousin.  ROS:   Please see the history of present illness.     EKGs/Labs/Other Studies Reviewed:    EKG:  The ekg ordered today demonstrates normal sinus rhythm, heart rate 73.  Recent Labs: No results found for requested labs within last 8760 hours.   Recent Lipid Panel Lab Results  Component Value Date/Time   CHOL 109 08/09/2019 08:30 AM   TRIG 80 08/09/2019 08:30 AM   HDL 33 (L) 08/09/2019 08:30 AM   LDLCALC 60 08/09/2019 08:30 AM    Physical Exam:     VS:  BP 116/64 (BP Location: Right Arm, Patient Position: Sitting, Cuff Size: Normal)   Pulse 73   Resp 20   Ht 5\' 3"  (1.6 m)   Wt 154 lb (69.9 kg)   SpO2 98%   BMI 27.28 kg/m    No data found.  Wt Readings from Last 3 Encounters:  03/05/22 154 lb (69.9 kg)  12/13/21 159 lb (72.1 kg)  10/18/21 160 lb (72.6 kg)     GEN:  Well nourished, well developed in no acute distress HEENT: Normal; wearing glasses NECK: No JVD; No carotid bruits CARDIAC: RRR, no murmurs, rubs, gallops RESPIRATORY:  Clear to auscultation without rales, wheezing or rhonchi  ABDOMEN: Soft, non-tender, non-distended MUSCULOSKELETAL: No edema; No deformity  SKIN: Warm and dry NEUROLOGIC:  Alert and oriented PSYCHIATRIC:  Normal affect     ASSESSMENT AND PLAN   Coronary artery disease, no angina -06/2004 Inf STEMI with PCI/BMS to RCA/LCX;  b. Cath 2014 40-50% focal bradycardia circ stenosis with a patent stent, RCA 50% stenosis, distal 60% stenosis in a previously stented area.  -EKG today without ischemic changes. -Continue aspirin 81 mg daily, statin therapy, beta-blocker. -Continue anti-anginals including Imdur and amlodipine. -Patient is low risk to proceed with eye surgery.  No further testing required.  Marland Kitchen.asa  Hypertension, well controlled -Continue current medications.  Normal renal function February 2023. -Goal BP is <130/80.  Recommend DASH diet (high in vegetables, fruits, low-fat dairy products, whole grains, poultry, fish, and nuts and low in sweets, sugar-sweetened beverages, and red meats), salt restriction and increase physical activity.  Hyperlipidemia with goal LDL less than 70 -LDL 58 in August 2022.  Continue Lipitor 20 mg daily.  Disposition - Follow-up in 1 year Dr. Antoine PocheHochrein.   Medication Adjustments/Labs and Tests Ordered: Current medicines are reviewed at length with the patient today.  Concerns regarding medicines are outlined above.  Orders Placed This Encounter  Procedures    EKG 12-Lead   No orders of the defined types were placed in this encounter.   Patient Instructions  Medication Instructions:  Your physician recommends that you continue on your current medications as directed. Please refer to the Current Medication list given to you today.  *If you need a refill on your cardiac medications before your next appointment, please call your pharmacy*  Lab Work: NONE ordered at this time of appointment   If you have labs (blood work) drawn today and your tests are completely normal, you will receive your results only by: MyChart Message (if you have MyChart) OR A paper copy in the mail If you have any lab test that is abnormal or we need to change your treatment, we will call you to review  the results.  Testing/Procedures: NONE ordered at this time of appointment   Follow-Up: At [facility], you and your health needs are our priority.  As part of our continuing mission to provide you with exceptional heart care, we have created designated Provider Care Teams.  These Care Teams include your primary Cardiologist (physician) and Advanced Practice Providers (APPs -  Physician Assistants and Nurse Practitioners) who all work together to provide you with the care you need, when you need it.  We recommend signing up for the patient portal called "MyChart".  Sign up information is provided on this After Visit Summary.  MyChart is used to connect with patients for Virtual Visits (Telemedicine).  Patients are able to view lab/test results, encounter notes, upcoming appointments, etc.  Non-urgent messages can be sent to your provider as well.   To learn more about what you can do with MyChart, go to [email or web address].    Your next appointment:   1 year(s)  The format for your next appointment:   In Person  Provider:   Rollene Rotunda, MD     Other Instructions   Important Information About Sugar         Signed, Bernette Mayers   03/05/2022 11:53 AM    [facility]

## 2022-03-05 ENCOUNTER — Ambulatory Visit (INDEPENDENT_AMBULATORY_CARE_PROVIDER_SITE_OTHER): Payer: Medicare Other | Admitting: Physician Assistant

## 2022-03-05 ENCOUNTER — Encounter: Payer: Self-pay | Admitting: Physician Assistant

## 2022-03-05 VITALS — BP 116/64 | HR 73 | Resp 20 | Ht 63.0 in | Wt 154.0 lb

## 2022-03-05 DIAGNOSIS — I25708 Atherosclerosis of coronary artery bypass graft(s), unspecified, with other forms of angina pectoris: Secondary | ICD-10-CM | POA: Diagnosis not present

## 2022-03-05 DIAGNOSIS — I10 Essential (primary) hypertension: Secondary | ICD-10-CM

## 2022-03-05 DIAGNOSIS — E785 Hyperlipidemia, unspecified: Secondary | ICD-10-CM | POA: Diagnosis not present

## 2022-03-05 NOTE — Patient Instructions (Addendum)
Medication Instructions:  ?Your physician recommends that you continue on your current medications as directed. Please refer to the Current Medication list given to you today.  ? ?*If you need a refill on your cardiac medications before your next appointment, please call your pharmacy* ? ? ?Lab Work: ?NONE ordered at this time of appointment   ? ?If you have labs (blood work) drawn today and your tests are completely normal, you will receive your results only by: ?MyChart Message (if you have MyChart) OR ?A paper copy in the mail ?If you have any lab test that is abnormal or we need to change your treatment, we will call you to review the results. ? ? ?Testing/Procedures: ?NONE ordered at this time of appointment  ? ? ? ?Follow-Up: ?At [facility], you and your health needs are our priority.  As part of our continuing mission to provide you with exceptional heart care, we have created designated Provider Care Teams.  These Care Teams include your primary Cardiologist (physician) and Advanced Practice Providers (APPs -  Physician Assistants and Nurse Practitioners) who all work together to provide you with the care you need, when you need it. ? ?We recommend signing up for the patient portal called "MyChart".  Sign up information is provided on this After Visit Summary.  MyChart is used to connect with patients for Virtual Visits (Telemedicine).  Patients are able to view lab/test results, encounter notes, upcoming appointments, etc.  Non-urgent messages can be sent to your provider as well.   ?To learn more about what you can do with MyChart, go to [email or web address].   ? ?Your next appointment:   ?1 year(s) ? ?The format for your next appointment:   ?In Person ? ?Provider:   ?James Hochrein, MD   ? ? ?Other Instructions ? ?Important Information About Sugar ? ? ? ? ? ? ?

## 2022-03-06 ENCOUNTER — Telehealth: Payer: Self-pay | Admitting: Cardiology

## 2022-03-06 NOTE — Telephone Encounter (Signed)
Pt c/o medication issue:  1. Name of Medication: acetaminophen (TYLENOL) 650 MG CR tablet  2. How are you currently taking this medication (dosage and times per day)? Take 650 mg by mouth every 8 (eight) hours as needed for pain.  3. Are you having a reaction (difficulty breathing--STAT)? no  4. What is your medication issue?  Patient states the the tylenol l is not working. Can he take ibuprofen and aleve

## 2022-03-06 NOTE — Telephone Encounter (Signed)
Called and spoke with pt's wife and daughter. Gave them the recommendations, they verbalized understanding. No further questions or concerns voiced at this time.

## 2022-03-06 NOTE — Telephone Encounter (Signed)
NSAIDs like ibuprofen and Aleve increase the risk of CV events like MI and stroke in patients who already have a history of CAD like pt does. He also has a history of HTN and GI bleed, and NSAIDs can increase BP and risk of GI bleed. Not the safest option to use for long term pain management. Depending on location of his pain, could try topical NSAID like Voltaren gel that's less absorbed systemically, otherwise would recommend he follow up with PCP for other pain medication options.

## 2022-05-09 ENCOUNTER — Telehealth: Payer: Self-pay

## 2022-05-09 NOTE — Telephone Encounter (Signed)
   Patient Name: Dustin Hess  DOB: 07/27/1946 MRN: [id number]  Primary Cardiologist: Rollene Rotunda, MD  Chart reviewed as part of pre-operative protocol coverage. Patient was just seen 03/05/22 by Bobbye Morton PA-C for this procedure and granted permission to proceed. The patient affirms he has been doing well without any new cardiac symptoms since that visit. Therefore, based on ACC/AHA guidelines, the patient would be at acceptable risk for the planned procedure without further cardiovascular testing. The patient was advised that if he develops new symptoms prior to surgery to contact our office to arrange for a follow-up visit, and he verbalized understanding.  Regarding aspirin, per our perioperative antiplatelet protocol and detailed review of prior cath notes, patient may hold aspirin for up to 7 days if necessary. If procedure can safely be performed on aspirin, would continue perioperatively. We will defer to surgeon to relay final instructions to patient.  Will route this bundled recommendation to requesting provider via Epic fax function. Please call with questions.   Laurann Montana, PA-C 05/09/2022, 1:52 PM

## 2022-05-09 NOTE — Telephone Encounter (Signed)
   Pre-operative Risk Assessment    Patient Name: Dustin Hess  DOB: Jun 25, 1946 MRN: [id number]      Request for Surgical Clearance    Procedure:   Left eye evisceration with implant. Left temporary stitch of eyelid   Date of Surgery:  Clearance 05/21/22                                 Surgeon:  Dr. Sarajane Marek  Surgeon's Group or Practice Name:  [facility]  Phone number:  [phone] Fax number:  [phone]   Type of Clearance Requested:   - Medical  - Pharmacy:  Hold Aspirin instructions    Type of Anesthesia:  MAC   Additional requests/questions:    SignedJacqulynn Cadet   05/09/2022, 1:33 PM

## 2022-09-20 ENCOUNTER — Other Ambulatory Visit: Payer: Self-pay | Admitting: Cardiology

## 2022-09-20 DIAGNOSIS — E785 Hyperlipidemia, unspecified: Secondary | ICD-10-CM

## 2022-09-20 DIAGNOSIS — I10 Essential (primary) hypertension: Secondary | ICD-10-CM

## 2022-09-20 DIAGNOSIS — I25708 Atherosclerosis of coronary artery bypass graft(s), unspecified, with other forms of angina pectoris: Secondary | ICD-10-CM

## 2022-10-22 ENCOUNTER — Telehealth: Payer: Self-pay | Admitting: Cardiology

## 2022-10-22 MED ORDER — ISOSORBIDE MONONITRATE ER 120 MG PO TB24: 120.0000 mg | ORAL_TABLET | Freq: Every day | ORAL | 3 refills | Status: DC

## 2022-10-22 NOTE — Telephone Encounter (Signed)
*  STAT* If patient is at the pharmacy, call can be transferred to refill team.   1. Which medications need to be refilled? (please list name of each medication and dose if known)  isosorbide mononitrate (IMDUR) 120 MG 24 hr tablet   2. Which pharmacy/location (including street and city if local pharmacy) is medication to be sent to? [id number] - [place], [place] [address]    3. Do they need a 30 day or 90 day supply? 90 day  Patient only has 1 pill left.

## 2023-03-26 ENCOUNTER — Ambulatory Visit: Payer: Medicare Other | Admitting: Cardiology

## 2023-04-15 ENCOUNTER — Ambulatory Visit
Admission: RE | Admit: 2023-04-15 | Discharge: 2023-04-15 | Disposition: A | Discharge status: Home / Self Care | Payer: Medicare HMO | Source: Ambulatory Visit | Attending: Family Medicine | Admitting: Family Medicine

## 2023-04-15 ENCOUNTER — Other Ambulatory Visit: Payer: Self-pay | Admitting: Family Medicine

## 2023-04-15 DIAGNOSIS — M5136 Other intervertebral disc degeneration, lumbar region: Secondary | ICD-10-CM

## 2023-05-01 NOTE — Progress Notes (Signed)
  Cardiology Office Note:   Date:  05/02/2023  ID:  Dustin Hess, DOB 11/26/1945, MRN [id number] PCP: Ellyn Hack, MD   [facility] Providers Cardiologist:  Rollene Rotunda, MD {  History of Present Illness:   Dustin Hess is a 77 y.o. male  with a hx of coronary disease with STEMI in 2005 status post PCI to RCA and LCx, dyslipidemia, hypertension, PACs.   I last saw him in December 2022.  He was seen last year by Juanda Crumble PAc.  Since he was last seen he has done well.  He has taken nitroglycerin rarely and thinks this is a stable pattern.  The last time was about 3 to 4 months ago.  He does get some chest discomfort and feels like he needs a nitroglycerin.  However, he is able to do some walking in his yard and some light chores being limited by blindness.  With that level of activity he does not bring on any chest discomfort, neck or arm discomfort.  He does not have any shortness of breath, PND or orthopnea.  He has no palpitations, presyncope or syncope.  ROS: As stated in the HPI and negative for all other systems.  Studies Reviewed:    EKG:   EKG Interpretation Date/Time:  Friday May 02 2023 08:57:27 EDT Ventricular Rate:  66 PR Interval:  154 QRS Duration:  74 QT Interval:  388 QTC Calculation: 406 R Axis:   -5  Text Interpretation: Sinus rhythm with PACs Biatrial enlargement When compared with ECG of 30-Apr-2013 05:24, No significant change since last tracing Confirmed by Rollene Rotunda ([id number]) on 05/02/2023 9:18:27 AM     Risk Assessment/Calculations:          Physical Exam:   VS:  BP (!) 144/88 (BP Location: Left Arm, Patient Position: Sitting, Cuff Size: Normal)   Pulse 66   Wt 203 lb 3.2 oz (92.2 kg)   SpO2 94%   BMI 36.00 kg/m    Wt Readings from Last 3 Encounters:  05/02/23 203 lb 3.2 oz (92.2 kg)  03/05/22 154 lb (69.9 kg)  12/13/21 159 lb (72.1 kg)     GEN: Well nourished, well developed in no acute distress NECK:  No JVD; No carotid bruits CARDIAC: RRR, no murmurs, rubs, gallops RESPIRATORY:  Clear to auscultation without rales, wheezing or rhonchi  ABDOMEN: Soft, non-tender, non-distended EXTREMITIES:  No edema; No deformity   ASSESSMENT AND PLAN:   Coronary artery disease:  The patient has no new sypmtoms.  No further cardiovascular testing is indicated.  We will continue with aggressive risk reduction and meds as listed.    Hypertension:  The BP is mildly elevated.  However, this is unusual.  No change in therapy.   Hyperlipidemia : LDL goal will be in the 50s.  I will check a lipid profile and LP(a) today.         Follow up with me in one year.   Signed, Rollene Rotunda, MD

## 2023-05-02 ENCOUNTER — Encounter: Payer: Self-pay | Admitting: Cardiology

## 2023-05-02 ENCOUNTER — Ambulatory Visit: Payer: Medicare HMO | Attending: Cardiology | Admitting: Cardiology

## 2023-05-02 VITALS — BP 144/88 | HR 66 | Wt 203.2 lb

## 2023-05-02 DIAGNOSIS — I10 Essential (primary) hypertension: Secondary | ICD-10-CM | POA: Diagnosis not present

## 2023-05-02 DIAGNOSIS — I25708 Atherosclerosis of coronary artery bypass graft(s), unspecified, with other forms of angina pectoris: Secondary | ICD-10-CM | POA: Diagnosis not present

## 2023-05-02 DIAGNOSIS — E785 Hyperlipidemia, unspecified: Secondary | ICD-10-CM

## 2023-05-02 NOTE — Patient Instructions (Signed)

## 2023-08-28 ENCOUNTER — Emergency Department (HOSPITAL_COMMUNITY): Payer: Medicare HMO

## 2023-08-28 ENCOUNTER — Emergency Department (HOSPITAL_COMMUNITY)
Admission: EM | Admit: 2023-08-28 | Discharge: 2023-08-28 | Disposition: A | Discharge status: Home / Self Care | Payer: Medicare HMO | Attending: Emergency Medicine | Admitting: Emergency Medicine

## 2023-08-28 ENCOUNTER — Other Ambulatory Visit: Payer: Self-pay

## 2023-08-28 ENCOUNTER — Encounter (HOSPITAL_COMMUNITY): Payer: Self-pay

## 2023-08-28 DIAGNOSIS — I700 Atherosclerosis of aorta: Secondary | ICD-10-CM | POA: Insufficient documentation

## 2023-08-28 DIAGNOSIS — S0990XA Unspecified injury of head, initial encounter: Secondary | ICD-10-CM | POA: Diagnosis present

## 2023-08-28 DIAGNOSIS — W1830XA Fall on same level, unspecified, initial encounter: Secondary | ICD-10-CM | POA: Diagnosis not present

## 2023-08-28 DIAGNOSIS — R55 Syncope and collapse: Secondary | ICD-10-CM | POA: Insufficient documentation

## 2023-08-28 DIAGNOSIS — R531 Weakness: Secondary | ICD-10-CM | POA: Insufficient documentation

## 2023-08-28 DIAGNOSIS — R4781 Slurred speech: Secondary | ICD-10-CM | POA: Diagnosis not present

## 2023-08-28 DIAGNOSIS — W2203XA Walked into furniture, initial encounter: Secondary | ICD-10-CM | POA: Insufficient documentation

## 2023-08-28 LAB — CBC WITH DIFFERENTIAL/PLATELET
Abs Immature Granulocytes: 0.02 10*3/uL (ref 0.00–0.07)
Basophils Absolute: 0 10*3/uL (ref 0.0–0.1)
Basophils Relative: 0 %
Eosinophils Absolute: 0 10*3/uL (ref 0.0–0.5)
Eosinophils Relative: 0 %
HCT: 40.7 % (ref 39.0–52.0)
Hemoglobin: 12.9 g/dL — ABNORMAL LOW (ref 13.0–17.0)
Immature Granulocytes: 0 %
Lymphocytes Relative: 7 %
Lymphs Abs: 0.3 10*3/uL — ABNORMAL LOW (ref 0.7–4.0)
MCH: 30.4 pg (ref 26.0–34.0)
MCHC: 31.7 g/dL (ref 30.0–36.0)
MCV: 95.8 fL (ref 80.0–100.0)
Monocytes Absolute: 0.4 10*3/uL (ref 0.1–1.0)
Monocytes Relative: 8 %
Neutro Abs: 3.8 10*3/uL (ref 1.7–7.7)
Neutrophils Relative %: 85 %
Platelets: 96 10*3/uL — ABNORMAL LOW (ref 150–400)
RBC: 4.25 MIL/uL (ref 4.22–5.81)
RDW: 11.9 % (ref 11.5–15.5)
WBC: 4.5 10*3/uL (ref 4.0–10.5)
nRBC: 0 % (ref 0.0–0.2)

## 2023-08-28 LAB — TROPONIN I (HIGH SENSITIVITY)
Troponin I (High Sensitivity): 6 ng/L (ref <18)
Troponin I (High Sensitivity): 6 ng/L (ref <18)

## 2023-08-28 LAB — BASIC METABOLIC PANEL
Anion gap: 8 (ref 5–15)
BUN: 24 mg/dL — ABNORMAL HIGH (ref 8–23)
CO2: 26 mmol/L (ref 22–32)
Calcium: 9.6 mg/dL (ref 8.9–10.3)
Chloride: 112 mmol/L — ABNORMAL HIGH (ref 98–111)
Creatinine, Ser: 1.15 mg/dL (ref 0.61–1.24)
GFR, Estimated: >60 mL/min (ref >60)
Glucose, Bld: 106 mg/dL — ABNORMAL HIGH (ref 70–99)
Potassium: 4.2 mmol/L (ref 3.5–5.1)
Sodium: 146 mmol/L — ABNORMAL HIGH (ref 135–145)

## 2023-08-28 LAB — BRAIN NATRIURETIC PEPTIDE: B Natriuretic Peptide: 297.9 pg/mL — ABNORMAL HIGH (ref 0.0–100.0)

## 2023-08-28 MED ORDER — LACTATED RINGERS IV BOLUS
1000.0000 mL | Freq: Once | INTRAVENOUS | Status: AC
  Administered 2023-08-28: 1000 mL via INTRAVENOUS

## 2023-08-28 NOTE — ED Triage Notes (Signed)
Pt bib [facility] coming from home after pt reports sliding out of chair, unwitnessed. Pt reports no LOC, no pain or physical complaints with EMS. EMS reports that family had concerns about pt having slurred speech. Pt alert and oriented but lethargic. Pt is blind in both eyes. Pt not on thinners.  EMS vital signs:  138/74  97% RA  74 HR, regular rhythm  131 cbg

## 2023-08-28 NOTE — ED Notes (Signed)
Trinna Post, RN attempted to call 3W to check on bed. No one answered. Will try back.

## 2023-08-28 NOTE — ED Provider Notes (Signed)
[place] EMERGENCY DEPARTMENT AT [facility] Provider Note   CSN: [id number] Arrival date & time: 08/28/23  1615     History Chief Complaint  Patient presents with   Fall    HPI Dustin Hess is a 77 y.o. male presenting for a syncopal event.  He was sitting at the dinner table roll for Thanksgiving dinner when he suddenly became unresponsive and slumped over in his chair.  He immediately woke back up and when they tried to get him up from the dinner table to get take him to the couch, he collapsed over hitting the back of his head on the chair.  He stayed awake which was just too weak to walk.  When EMS got there, his blood pressure was low but by time they had gotten him situated his blood pressure had improved.  Patient denies any acute concerns.  He denies fevers chills nausea vomiting shortness of breath.  Does report that he is only drinking coffee and tea and is not drinking any water on a normal day.  He is had other near syncopal events over the last 2 weeks.  Denies any chest pain at this time.  Otherwise ambulatory tolerating p.o. intake.  He does have both of his family members at bedside.  They state that they wanted to make sure that he was safe this week and has had a I had already scheduled a primary care appointment related to his near syncope events for next Monday..   Patient's recorded medical, surgical, social, medication list and allergies were reviewed in the Snapshot window as part of the initial history.   Review of Systems   Review of Systems  Constitutional:  Negative for chills and fever.  HENT:  Negative for ear pain and sore throat.   Eyes:  Negative for pain and visual disturbance.  Respiratory:  Negative for cough and shortness of breath.   Cardiovascular:  Negative for chest pain and palpitations.  Gastrointestinal:  Negative for abdominal pain and vomiting.  Genitourinary:  Negative for dysuria and hematuria.  Musculoskeletal:   Negative for arthralgias and back pain.  Skin:  Negative for color change and rash.  Neurological:  Negative for seizures and syncope.  All other systems reviewed and are negative.   Physical Exam Updated Vital Signs BP (!) 141/82   Pulse 70   Temp 97.7 F (36.5 C) (Oral)   Resp 16   Ht 5\' 3"  (1.6 m)   SpO2 100%   BMI 36.00 kg/m  Physical Exam Vitals and nursing note reviewed.  Constitutional:      General: He is not in acute distress.    Appearance: He is well-developed.  HENT:     Head: Normocephalic and atraumatic.  Eyes:     Conjunctiva/sclera: Conjunctivae normal.  Cardiovascular:     Rate and Rhythm: Normal rate and regular rhythm.     Heart sounds: No murmur heard. Pulmonary:     Effort: Pulmonary effort is normal. No respiratory distress.     Breath sounds: Normal breath sounds.  Abdominal:     Palpations: Abdomen is soft.     Tenderness: There is no abdominal tenderness.  Musculoskeletal:        General: No swelling.     Cervical back: Neck supple.  Skin:    General: Skin is warm and dry.     Capillary Refill: Capillary refill takes less than 2 seconds.  Neurological:     Mental Status: He is alert.  Psychiatric:        Mood and Affect: Mood normal.      ED Course/ Medical Decision Making/ A&P Clinical Course as of 08/28/23 2205  Thu Aug 28, 2023  1636 Was sitting at the dinner table and had a near syncopal event. Speech slurred. Went limp and rolled out of chair.  Recent PD diagnosis. [CC]    Clinical Course User Index [CC] Glyn Ade, MD    Procedures Procedures   Medications Ordered in ED Medications  lactated ringers bolus 1,000 mL (0 mLs Intravenous Stopped 08/28/23 1903)   Medical Decision Making:   Dustin Hess is a 77 y.o. male who presented to the ED today with a syncopal episode detailed above.    Patient placed on continuous vitals and telemetry monitoring while in ED which was reviewed periodically.  Complete  initial physical exam performed, notably the patient  was HDS in NAD.    Reviewed and confirmed nursing documentation for past medical history, family history, social history.    Initial Assessment:   With the patient's presentation of syncope, most likely diagnosis is orthostatic hypotension vs vasovagal episode. Other diagnoses were considered including (but not limited to) arrythmogenic syncope, valvular abnormality, PE, aortic dissection. These are considered less likely due to history of present illness and physical exam findings.   This is most consistent with an acute life/limb threatening illness complicated by underlying chronic conditions. In particular, concerning cardiac etiology, this is less likely to be the etiology given the lack of chest pain, lack of serious comorbidities including heart failure or CAD.   Additionally, patient's history appears more consistent with benign episodes including orthostatic event vs  vagal episode based on prodrome, recurrent episodes and return to baseline after blood pressure normalization. Initial Plan:  Empiric IV fluids, prolonged observation in the emergency room Screening labs including CBC and Metabolic panel to evaluate for infectious or metabolic etiology of disease.  Urinalysis with reflex culture ordered to evaluate for UTI or relevant urologic/nephrologic pathology.  CXR to evaluate for structural/infectious intrathoracic pathology.  Troponin/BNP/EKG to evaluate for cardiac pathology. Utilization of FAINT scoring detailed above.  Objective evaluation as below reviewed after administration of IVF/Telemetry monitoring  Initial Study Results:   Laboratory  All laboratory results reviewed without evidence of clinically relevant pathology.     EKG EKG was reviewed independently. Rate, rhythm, axis, intervals all examined and without medically relevant abnormality. ST segments without concerns for elevations.    Radiology:  All images  reviewed independently. Agree with radiology report at this time.   CT HEAD WO CONTRAST ( )  Result Date: 08/28/2023 CLINICAL DATA:  Head trauma, minor (Age >= 65y); Polytrauma, blunt EXAM: CT HEAD WITHOUT CONTRAST CT CERVICAL SPINE WITHOUT CONTRAST TECHNIQUE: Multidetector CT imaging of the head and cervical spine was performed following the standard protocol without intravenous contrast. Multiplanar CT image reconstructions of the cervical spine were also generated. RADIATION DOSE REDUCTION: This exam was performed according to the departmental dose-optimization program which includes automated exposure control, adjustment of the mA and/or kV according to patient size and/or use of iterative reconstruction technique. COMPARISON:  None Available. FINDINGS: CT HEAD FINDINGS Brain: No evidence of large-territorial acute infarction. No parenchymal hemorrhage. No mass lesion. No extra-axial collection. No mass effect or midline shift. No hydrocephalus. Basilar cisterns are patent. Vascular: No hyperdense vessel. Skull: No acute fracture or focal lesion. Sinuses/Orbits: Right maxillary sinus mucosal thickening. Otherwise paranasal sinuses and mastoid air cells are clear. Bilateral globes surgical changes.  Other: None. CT CERVICAL SPINE FINDINGS Alignment: Normal. Skull base and vertebrae: Multilevel moderate degenerative changes spine. No associated severe osseous neural foraminal or central canal stenosis. No acute fracture. No aggressive appearing focal osseous lesion or focal pathologic process. Soft tissues and spinal canal: No prevertebral fluid or swelling. No visible canal hematoma. Upper chest: Unremarkable. Other: Atherosclerotic plaque of the aortic arch and its branches. IMPRESSION: 1. No acute intracranial abnormality. 2. No acute displaced fracture or traumatic listhesis of the cervical spine. 3.  Aortic Atherosclerosis (ICD10-I70.0). Electronically Signed   By: Tish Frederickson M.D.   On: 08/28/2023  19:33   CT CERVICAL SPINE WO CONTRAST  Result Date: 08/28/2023 CLINICAL DATA:  Head trauma, minor (Age >= 65y); Polytrauma, blunt EXAM: CT HEAD WITHOUT CONTRAST CT CERVICAL SPINE WITHOUT CONTRAST TECHNIQUE: Multidetector CT imaging of the head and cervical spine was performed following the standard protocol without intravenous contrast. Multiplanar CT image reconstructions of the cervical spine were also generated. RADIATION DOSE REDUCTION: This exam was performed according to the departmental dose-optimization program which includes automated exposure control, adjustment of the mA and/or kV according to patient size and/or use of iterative reconstruction technique. COMPARISON:  None Available. FINDINGS: CT HEAD FINDINGS Brain: No evidence of large-territorial acute infarction. No parenchymal hemorrhage. No mass lesion. No extra-axial collection. No mass effect or midline shift. No hydrocephalus. Basilar cisterns are patent. Vascular: No hyperdense vessel. Skull: No acute fracture or focal lesion. Sinuses/Orbits: Right maxillary sinus mucosal thickening. Otherwise paranasal sinuses and mastoid air cells are clear. Bilateral globes surgical changes. Other: None. CT CERVICAL SPINE FINDINGS Alignment: Normal. Skull base and vertebrae: Multilevel moderate degenerative changes spine. No associated severe osseous neural foraminal or central canal stenosis. No acute fracture. No aggressive appearing focal osseous lesion or focal pathologic process. Soft tissues and spinal canal: No prevertebral fluid or swelling. No visible canal hematoma. Upper chest: Unremarkable. Other: Atherosclerotic plaque of the aortic arch and its branches. IMPRESSION: 1. No acute intracranial abnormality. 2. No acute displaced fracture or traumatic listhesis of the cervical spine. 3.  Aortic Atherosclerosis (ICD10-I70.0). Electronically Signed   By: Tish Frederickson M.D.   On: 08/28/2023 19:33   DG Chest Portable 1 View  Result Date:  08/28/2023 CLINICAL DATA:  Syncope. Patient slid out of chair. No loss of consciousness. EXAM: PORTABLE CHEST 1 VIEW COMPARISON:  Two-view chest x-ray 04/28/2013 FINDINGS: Low lung volumes exaggerate the heart size. No focal airspace disease is present. No edema or effusions are present. No pneumothorax is present. The visualized soft tissues and bony thorax are unremarkable. IMPRESSION: 1. Low lung volumes. 2. No acute cardiopulmonary disease. Electronically Signed   By: Marin Roberts M.D.   On: 08/28/2023 17:16      Final Assessment and Plan:   Patient kept in ER for 5 hours 50 minutes on telemetry no further pathology detected, EKG reassuring lab work grossly reassuring.  Patient improved after IV fluids.  Family will plan to watch patient tonight and feel comfortable with this discharge at this time.  Given his well appearance restoration normal mental status I believe patient stable for outpatient care and management.  Considered admission to medicine for observation, however patient is already established very close follow-up and has good family support for outpatient care.   Disposition:  I have considered need for hospitalization, however, considering all of the above, I believe this patient is stable for discharge at this time.  Patient/family educated about specific return precautions for given chief complaint and symptoms.  Patient/family educated about follow-up with PCP.     Patient/family expressed understanding of return precautions and need for follow-up. Patient spoken to regarding all imaging and laboratory results and appropriate follow up for these results. All education provided in verbal form with additional information in written form. Time was allowed for answering of patient questions. Patient discharged.    Emergency Department Medication Summary:   Medications  lactated ringers bolus 1,000 mL (0 mLs Intravenous Stopped 08/28/23 1903)        Clinical  Impression:  1. Syncope, unspecified syncope type      Discharge   Final Clinical Impression(s) / ED Diagnoses Final diagnoses:  Syncope, unspecified syncope type    Rx / DC Orders ED Discharge Orders     None         Glyn Ade, MD 08/28/23 2207

## 2023-09-11 ENCOUNTER — Telehealth: Payer: Self-pay | Admitting: Cardiology

## 2023-09-11 MED ORDER — NITROGLYCERIN 0.4 MG SL SUBL: 0.4000 mg | SUBLINGUAL_TABLET | SUBLINGUAL | 6 refills | Status: AC | PRN

## 2023-09-11 NOTE — Telephone Encounter (Signed)
*  STAT* If patient is at the pharmacy, call can be transferred to refill team.   1. Which medications need to be refilled? (please list name of each medication and dose if known)   nitroGLYCERIN (NITROSTAT) 0.4 MG SL tablet     2. Would you like to learn more about the convenience, safety, & potential cost savings by using the [facility]? No   3. Are you open to using the [facility] (Type [facility] No   4. Which pharmacy/location (including street and city if local pharmacy) is medication to be sent to? [id number] - [place], [place] - [address]    5. Do they need a 30 day or 90 day supply? 30 day

## 2023-09-11 NOTE — Telephone Encounter (Signed)
Refill faxed to the pharmacy

## 2023-12-04 ENCOUNTER — Other Ambulatory Visit: Payer: Self-pay | Admitting: Cardiology

## 2024-02-06 ENCOUNTER — Encounter (INDEPENDENT_AMBULATORY_CARE_PROVIDER_SITE_OTHER): Payer: Self-pay | Admitting: Otolaryngology

## 2024-02-24 ENCOUNTER — Encounter (INDEPENDENT_AMBULATORY_CARE_PROVIDER_SITE_OTHER): Payer: Self-pay | Admitting: Physician Assistant

## 2024-02-24 ENCOUNTER — Ambulatory Visit (INDEPENDENT_AMBULATORY_CARE_PROVIDER_SITE_OTHER): Admitting: Physician Assistant

## 2024-02-24 VITALS — BP 202/91 | HR 70 | Ht 63.0 in | Wt 142.0 lb

## 2024-02-24 DIAGNOSIS — H9193 Unspecified hearing loss, bilateral: Secondary | ICD-10-CM

## 2024-02-24 DIAGNOSIS — H6123 Impacted cerumen, bilateral: Secondary | ICD-10-CM | POA: Diagnosis not present

## 2024-02-24 NOTE — Progress Notes (Signed)
 Dear Dr. Mason Sole, Here is my assessment for our mutual patient, Dustin Hess. Thank you for allowing me the opportunity to care for your patient. Please do not hesitate to contact me should you have any other questions. Sincerely, Belma Boxer PA-C  Otolaryngology Clinic Note Referring provider: Dr. Mason Sole HPI:  Dustin Hess is a 78 y.o. male kindly referred by Dr. Mason Sole   The patient is a 78 year old male seen in our office for evaluation of decreased hearing.  The patient is accompanied by his brother.  He notes for the last few years he has had difficulty hearing, right ear has been worse than the left.  He feels like he has some water in the ears, he denies any dizziness, no ringing.  He notes a history of cerumen impaction previously.  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  He has no other acute complaints or concerns today.   Independent Review of Additional Tests or Records:  none   PMH/Meds/All/SocHx/FamHx/ROS:   Past Medical History:  Diagnosis Date   Anemia, unspecified 12/22/2013   Arthritis    Benign prostatic hypertrophy    Congenital blindness    Coronary artery disease    a. 06/2004 Inf STEMI with PCI/BMS to RCA/LCX;  b. Cath 2014 40-50% focal bradycardia circ stenosis with a patent stent, RCA 50% stenosis, distal 60% stenosis in a previously stented area. 06/2011 Neg MV, EF 67%.   Dyslipidemia    GERD (gastroesophageal reflux disease)    GI bleeding 2006   Hypertension    Hypothyroidism    Leukopenia 12/22/2013     Past Surgical History:  Procedure Laterality Date   COLONOSCOPY     CORONARY ANGIOPLASTY WITH STENT PLACEMENT     ESOPHAGOGASTRODUODENOSCOPY     HEMORRHOID SURGERY     LEFT HEART CATHETERIZATION WITH CORONARY ANGIOGRAM N/A 04/30/2013   Procedure: LEFT HEART CATHETERIZATION WITH CORONARY ANGIOGRAM;  Surgeon: Arnoldo Lapping, MD;  Location: [facility] CATH LAB;  Service: Cardiovascular;   Laterality: N/A;    Family History  Problem Relation Age of Onset   Cancer Mother 77       kidney   Cancer Father 57       blood cancer   Heart attack Cousin        died in her 9's.     Social Connections: Not on file      Current Outpatient Medications:    acetaminophen  (TYLENOL ) 650 MG CR tablet, Take 650 mg by mouth every 8 (eight) hours as needed for pain., Disp: , Rfl:    amLODipine  (NORVASC ) 5 MG tablet, TAKE 1 TABLET (5 MG TOTAL) BY MOUTH DAILY., Disp: 90 tablet, Rfl: 1   aspirin  81 MG tablet, Take 81 mg by mouth daily.  , Disp: , Rfl:    Cholecalciferol (VITAMIN D3) 1000 UNITS CAPS, Take 1 capsule by mouth daily.  , Disp: , Rfl:    dutasteride  (AVODART ) 0.5 MG capsule, Take 0.5 mg by mouth daily.  , Disp: , Rfl:    isosorbide  mononitrate (IMDUR ) 120 MG 24 hr tablet, TAKE 1 TABLET BY MOUTH EVERY DAY, Disp: 90 tablet, Rfl: 1   levothyroxine  (SYNTHROID , LEVOTHROID) 112 MCG tablet, Take 112 mcg by mouth daily before breakfast., Disp: , Rfl:    losartan  (COZAAR ) 100 MG tablet, TAKE 1 TABLET BY MOUTH EVERY DAY, Disp: 90 tablet, Rfl: 3   metoprolol  succinate (TOPROL -XL) 100 MG 24 hr tablet, TAKE 1 TABLET  BY MOUTH DAILY. TAKE WITH OR IMMEDIATELY FOLLOWING A MEAL., Disp: 90 tablet, Rfl: 3   Multiple Vitamin (MULTIVITAMIN) tablet, Take 1 tablet by mouth daily., Disp: , Rfl:    nitroGLYCERIN  (NITROSTAT ) 0.4 MG SL tablet, Place 1 tablet (0.4 mg total) under the tongue every 5 (five) minutes as needed. Must call and schedule appt for future refills, Disp: 25 tablet, Rfl: 6   tamsulosin  (FLOMAX ) 0.4 MG CAPS capsule, Take 0.4 mg by mouth daily.  , Disp: , Rfl:    triamterene -hydrochlorothiazide (DYAZIDE) 37.5-25 MG capsule, Take 1 each (1 capsule total) by mouth daily., Disp: 30 capsule, Rfl: 0   Physical Exam:   There were no vitals taken for this visit.  Pertinent Findings  CN II-XII intact Bilateral cerumen impaction Weber 512: equal Rinne 512: AC > BC b/l  Anterior rhinoscopy:  Septum midline; bilateral inferior turbinates with no hypertrophy No lesions of oral cavity/oropharynx; dentition within normal limits No obviously palpable neck masses/lymphadenopathy/thyromegaly No respiratory distress or stridor  Seprately Identifiable Procedures:  Procedure: Bilateral ear microscopy and cerumen removal using microscope (CPT [phone]) - Mod 50 Pre-procedure diagnosis: bilateral cerumen impaction external auditory canals Post-procedure diagnosis: same Indication: bilateral cerumen impaction; given patient's otologic complaints and history as well as for improved and comprehensive examination of external ear and tympanic membrane, bilateral otologic examination using microscope was performed and impacted cerumen removed  Procedure: Patient was placed semi-recumbent. Both ear canals were examined using the microscope with findings above. Cerumen removed from bilateral external auditory canals using suction and currette with improvement in EAC examination and patency. Left: EAC was patent. TM was intact . Middle ear was aerated. Drainage: none Right: EAC was patent. TM was intact . Middle ear was aerated . Drainage: none Patient tolerated the procedure well.   Impression & Plans:  Dustin Hess is a 78 y.o. male with the following   Cerumen impaction-  Impaction removed without difficulty.  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  He does have significant noise exposure history.  I have recommended audiological evaluation and follow-up in our clinic after the results.  The patient will complete the testing and follow-up thereafter.   - f/u office visit with audiological evaluation   Thank you for allowing me the opportunity to care for your patient. Please do not hesitate to contact me should you have any other questions.  Sincerely, Belma Boxer PA-C [facility] Phone:  [phone] Fax: [phone]  02/24/2024, 9:03 AM

## 2024-03-25 ENCOUNTER — Ambulatory Visit (INDEPENDENT_AMBULATORY_CARE_PROVIDER_SITE_OTHER): Admitting: Physician Assistant

## 2024-03-25 ENCOUNTER — Encounter (INDEPENDENT_AMBULATORY_CARE_PROVIDER_SITE_OTHER): Payer: Self-pay | Admitting: Physician Assistant

## 2024-03-25 ENCOUNTER — Ambulatory Visit (INDEPENDENT_AMBULATORY_CARE_PROVIDER_SITE_OTHER): Admitting: Audiology

## 2024-03-25 VITALS — BP 165/82 | HR 74

## 2024-03-25 DIAGNOSIS — H903 Sensorineural hearing loss, bilateral: Secondary | ICD-10-CM

## 2024-03-25 DIAGNOSIS — H9193 Unspecified hearing loss, bilateral: Secondary | ICD-10-CM | POA: Diagnosis not present

## 2024-03-25 NOTE — Progress Notes (Signed)
  [address], [address] [place], [place] [address] [phone]  Audiological Evaluation    Name: Dustin Hess     DOB:   [birth year]      MRN:   [id number]                                                                                     Service Date: 03/25/2024     Accompanied by: brother   Patient comes today after Reyes Cohen, PA-C sent a referral for a hearing evaluation due to concerns with hearing loss.   Symptoms Yes Details  Hearing loss  [x]  Seems hearing is going away  Tinnitus  []    Ear pain/ infections/pressure  []    Balance problems  []    Noise exposure history  []    Previous ear surgeries  []    Family history of hearing loss  [x]  Brother has hearing loss and was fit with hearing aids.   Amplification  []    Other  []        Tympanometry: Right ear: Type A- Normal external ear canal volume with normal middle ear pressure and tympanic membrane compliance. Left ear: Type A- Normal external ear canal volume with normal middle ear pressure and tympanic membrane compliance.    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  Speech Audiometry: Right ear- Speech Reception Threshold (SRT) was obtained at 45 dBHL. Left ear-Speech Reception Threshold (SRT) was obtained at 45 dBHL.   Word Recognition Score Tested using NU-6 (recorded) Right ear: 64% was obtained at a presentation level of 85 dBHL with contralateral masking which is deemed as  poor. Left ear: 76% was obtained at a presentation level of 85 dBHL with contralateral masking which is deemed as  fair.   The hearing test results were completed under headphones and results are deemed to be of good to fair reliability. Test technique:  conventional      Recommendations: Follow up with ENT as scheduled for today., Return for a hearing evaluation in 2-3  years, before if concerns with hearing changes arise or per MD recommendation. Consider a communication needs assessment after medical clearance for hearing aids is obtained.   Kristi Norment MARIE LEROUX-MARTINEZ, AUD

## 2024-03-25 NOTE — Progress Notes (Signed)
 Dear Dr. Maree, Here is my assessment for our mutual patient, Dustin Hess. Thank you for allowing me the opportunity to care for your patient. Please do not hesitate to contact me should you have any other questions. Sincerely, Chyrl Cohen PA-C  Otolaryngology Clinic Note Referring provider: Dr. Maree HPI:  Dustin Hess is a 78 y.o. male kindly referred by Dr. Maree   The patient is a 78 year old gentleman seen in our office for follow-up evaluation of cerumen impaction and hearing evaluation.  The patient was last seen in the office on 02/24/2024 for evaluation of decreased hearing.  At that time he had cerumen impaction that was removed without difficulty.  He noted ongoing hearing loss.  We recommended audiological evaluation.  Update 03/29/2024-  Since his last office visit he notes he has been doing well, he notes no pain in the ears, drainage.  He notes continued bilateral decreased hearing right worse than left.  He notes this is been longstanding.     Independent Review of Additional Tests or Records:  Audiological evaluation 03/25/2024  Tympanometry: Right ear: Type A- Normal external ear canal volume with normal middle ear pressure and tympanic membrane compliance. Left ear: Type A- Normal external ear canal volume with normal middle ear pressure and tympanic membrane compliance.     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   Speech Audiometry: Right ear- Speech Reception Threshold (SRT) was obtained at 45 dBHL. Left ear-Speech Reception Threshold (SRT) was obtained at 45 dBHL.   Word Recognition Score Tested using NU-6 (recorded) Right ear: 64% was obtained at a presentation level of 85 dBHL with contralateral masking which is deemed as  poor. Left ear: 76% was obtained at a presentation level of 85 dBHL with  contralateral masking which is deemed as  fair.   The hearing test results were completed under headphones and results are deemed to be of good to fair reliability. Test technique:  conventional      PMH/Meds/All/SocHx/FamHx/ROS:   Past Medical History:  Diagnosis Date   Anemia, unspecified 12/22/2013   Arthritis    Benign prostatic hypertrophy    Congenital blindness    Coronary artery disease    a. 06/2004 Inf STEMI with PCI/BMS to RCA/LCX;  b. Cath 2014 40-50% focal bradycardia circ stenosis with a patent stent, RCA 50% stenosis, distal 60% stenosis in a previously stented area. 06/2011 Neg MV, EF 67%.   Dyslipidemia    GERD (gastroesophageal reflux disease)    GI bleeding 2006   Hypertension    Hypothyroidism    Leukopenia 12/22/2013     Past Surgical History:  Procedure Laterality Date   COLONOSCOPY     CORONARY ANGIOPLASTY WITH STENT PLACEMENT     ESOPHAGOGASTRODUODENOSCOPY     HEMORRHOID SURGERY     LEFT HEART CATHETERIZATION WITH CORONARY ANGIOGRAM N/A 04/30/2013   Procedure: LEFT HEART CATHETERIZATION WITH CORONARY ANGIOGRAM;  Surgeon: Ozell Fell, MD;  Location: [facility] CATH LAB;  Service: Cardiovascular;  Laterality: N/A;    Family History  Problem Relation Age of Onset   Cancer Mother 75       kidney   Cancer Father 37       blood cancer   Heart attack Cousin        died in her 70's.     Social Connections: Not on file  Current Outpatient Medications:    acetaminophen  (TYLENOL ) 650 MG CR tablet, Take 650 mg by mouth every 8 (eight) hours as needed for pain., Disp: , Rfl:    amLODipine  (NORVASC ) 5 MG tablet, TAKE 1 TABLET (5 MG TOTAL) BY MOUTH DAILY., Disp: 90 tablet, Rfl: 1   aspirin  81 MG tablet, Take 81 mg by mouth daily.  , Disp: , Rfl:    Cholecalciferol (VITAMIN D3) 1000 UNITS CAPS, Take 1 capsule by mouth daily.  , Disp: , Rfl:    dutasteride  (AVODART ) 0.5 MG capsule, Take 0.5 mg by mouth daily.  , Disp: , Rfl:    isosorbide  mononitrate (IMDUR )  120 MG 24 hr tablet, TAKE 1 TABLET BY MOUTH EVERY DAY, Disp: 90 tablet, Rfl: 1   levothyroxine  (SYNTHROID , LEVOTHROID) 112 MCG tablet, Take 112 mcg by mouth daily before breakfast., Disp: , Rfl:    losartan  (COZAAR ) 100 MG tablet, TAKE 1 TABLET BY MOUTH EVERY DAY, Disp: 90 tablet, Rfl: 3   metoprolol  succinate (TOPROL -XL) 100 MG 24 hr tablet, TAKE 1 TABLET BY MOUTH DAILY. TAKE WITH OR IMMEDIATELY FOLLOWING A MEAL., Disp: 90 tablet, Rfl: 3   Multiple Vitamin (MULTIVITAMIN) tablet, Take 1 tablet by mouth daily., Disp: , Rfl:    nitroGLYCERIN  (NITROSTAT ) 0.4 MG SL tablet, Place 1 tablet (0.4 mg total) under the tongue every 5 (five) minutes as needed. Must call and schedule appt for future refills, Disp: 25 tablet, Rfl: 6   tamsulosin  (FLOMAX ) 0.4 MG CAPS capsule, Take 0.4 mg by mouth daily.  , Disp: , Rfl:    triamterene -hydrochlorothiazide (DYAZIDE) 37.5-25 MG capsule, Take 1 each (1 capsule total) by mouth daily., Disp: 30 capsule, Rfl: 0   Physical Exam:   BP (!) 165/82   Pulse 74   SpO2 95%   Pertinent Findings  CN II-XII grossly intact Bilateral EAC clear and TM intact with well pneumatized middle ear spaces No obviously  neck masses/lymphadenopathy/thyromegaly No respiratory distress or stridor  Seprately Identifiable Procedures:  None  Impression & Plans:  Dustin Hess is a 78 y.o. male with the following   Hearing loss-  78 year old gentleman seen in our office for evaluation of hearing loss.  He has mild to profound hearing loss bilateral, no significant asymmetry.  He would benefit from hearing aids.  I am happy to see him back in the office at any point for further evaluation management as needed.   - f/u PRN  Tried to call no answer hearing aids    Thank you for allowing me the opportunity to care for your patient. Please do not hesitate to contact me should you have any other questions.  Sincerely, Chyrl Cohen PA-C [facility] Phone:  [phone] Fax: [phone]  03/25/2024, 11:06 AM

## 2024-03-28 ENCOUNTER — Other Ambulatory Visit: Payer: Self-pay

## 2024-03-28 ENCOUNTER — Encounter (HOSPITAL_COMMUNITY): Payer: Self-pay

## 2024-03-28 ENCOUNTER — Emergency Department (HOSPITAL_COMMUNITY)

## 2024-03-28 ENCOUNTER — Observation Stay (HOSPITAL_COMMUNITY)

## 2024-03-28 ENCOUNTER — Inpatient Hospital Stay (HOSPITAL_COMMUNITY)
Admission: EM | Admit: 2024-03-28 | Discharge: 2024-04-05 | DRG: 640 | Disposition: A | Discharge status: Skilled Nursing Facility | Attending: Internal Medicine | Admitting: Internal Medicine

## 2024-03-28 DIAGNOSIS — I252 Old myocardial infarction: Secondary | ICD-10-CM

## 2024-03-28 DIAGNOSIS — E860 Dehydration: Secondary | ICD-10-CM | POA: Diagnosis not present

## 2024-03-28 DIAGNOSIS — D649 Anemia, unspecified: Secondary | ICD-10-CM | POA: Diagnosis present

## 2024-03-28 DIAGNOSIS — E785 Hyperlipidemia, unspecified: Secondary | ICD-10-CM | POA: Diagnosis present

## 2024-03-28 DIAGNOSIS — I25810 Atherosclerosis of coronary artery bypass graft(s) without angina pectoris: Secondary | ICD-10-CM | POA: Diagnosis present

## 2024-03-28 DIAGNOSIS — E870 Hyperosmolality and hypernatremia: Secondary | ICD-10-CM | POA: Diagnosis present

## 2024-03-28 DIAGNOSIS — E039 Hypothyroidism, unspecified: Secondary | ICD-10-CM | POA: Diagnosis present

## 2024-03-28 DIAGNOSIS — Z751 Person awaiting admission to adequate facility elsewhere: Secondary | ICD-10-CM

## 2024-03-28 DIAGNOSIS — I10 Essential (primary) hypertension: Secondary | ICD-10-CM | POA: Diagnosis not present

## 2024-03-28 DIAGNOSIS — E538 Deficiency of other specified B group vitamins: Secondary | ICD-10-CM | POA: Diagnosis present

## 2024-03-28 DIAGNOSIS — R5381 Other malaise: Secondary | ICD-10-CM

## 2024-03-28 DIAGNOSIS — K8020 Calculus of gallbladder without cholecystitis without obstruction: Secondary | ICD-10-CM | POA: Diagnosis present

## 2024-03-28 DIAGNOSIS — I2510 Atherosclerotic heart disease of native coronary artery without angina pectoris: Secondary | ICD-10-CM | POA: Diagnosis present

## 2024-03-28 DIAGNOSIS — Z8249 Family history of ischemic heart disease and other diseases of the circulatory system: Secondary | ICD-10-CM

## 2024-03-28 DIAGNOSIS — N179 Acute kidney failure, unspecified: Secondary | ICD-10-CM | POA: Diagnosis not present

## 2024-03-28 DIAGNOSIS — R1310 Dysphagia, unspecified: Secondary | ICD-10-CM | POA: Diagnosis present

## 2024-03-28 DIAGNOSIS — E871 Hypo-osmolality and hyponatremia: Secondary | ICD-10-CM | POA: Diagnosis present

## 2024-03-28 DIAGNOSIS — Z79899 Other long term (current) drug therapy: Secondary | ICD-10-CM

## 2024-03-28 DIAGNOSIS — G9341 Metabolic encephalopathy: Principal | ICD-10-CM | POA: Diagnosis present

## 2024-03-28 DIAGNOSIS — Z79890 Hormone replacement therapy (postmenopausal): Secondary | ICD-10-CM

## 2024-03-28 DIAGNOSIS — G319 Degenerative disease of nervous system, unspecified: Secondary | ICD-10-CM | POA: Diagnosis present

## 2024-03-28 DIAGNOSIS — H543 Unqualified visual loss, both eyes: Secondary | ICD-10-CM | POA: Diagnosis present

## 2024-03-28 DIAGNOSIS — N400 Enlarged prostate without lower urinary tract symptoms: Secondary | ICD-10-CM | POA: Diagnosis present

## 2024-03-28 DIAGNOSIS — Z1152 Encounter for screening for COVID-19: Secondary | ICD-10-CM

## 2024-03-28 DIAGNOSIS — Z7982 Long term (current) use of aspirin: Secondary | ICD-10-CM

## 2024-03-28 DIAGNOSIS — Z888 Allergy status to other drugs, medicaments and biological substances status: Secondary | ICD-10-CM

## 2024-03-28 DIAGNOSIS — H547 Unspecified visual loss: Secondary | ICD-10-CM

## 2024-03-28 DIAGNOSIS — E611 Iron deficiency: Secondary | ICD-10-CM | POA: Diagnosis present

## 2024-03-28 DIAGNOSIS — Z955 Presence of coronary angioplasty implant and graft: Secondary | ICD-10-CM

## 2024-03-28 DIAGNOSIS — D696 Thrombocytopenia, unspecified: Secondary | ICD-10-CM | POA: Diagnosis present

## 2024-03-28 LAB — COMPREHENSIVE METABOLIC PANEL WITH GFR
ALT: 13 U/L (ref 0–44)
AST: 19 U/L (ref 15–41)
Albumin: 3.7 g/dL (ref 3.5–5.0)
Alkaline Phosphatase: 30 U/L — ABNORMAL LOW (ref 38–126)
Anion gap: 15 (ref 5–15)
BUN: 54 mg/dL — ABNORMAL HIGH (ref 8–23)
CO2: 24 mmol/L (ref 22–32)
Calcium: 9.4 mg/dL (ref 8.9–10.3)
Chloride: 114 mmol/L — ABNORMAL HIGH (ref 98–111)
Creatinine, Ser: 2.79 mg/dL — ABNORMAL HIGH (ref 0.61–1.24)
GFR, Estimated: 22 mL/min — ABNORMAL LOW (ref >60)
Glucose, Bld: 128 mg/dL — ABNORMAL HIGH (ref 70–99)
Potassium: 3.7 mmol/L (ref 3.5–5.1)
Sodium: 153 mmol/L — ABNORMAL HIGH (ref 135–145)
Total Bilirubin: 1.4 mg/dL — ABNORMAL HIGH (ref 0.0–1.2)
Total Protein: 6.4 g/dL — ABNORMAL LOW (ref 6.5–8.1)

## 2024-03-28 LAB — CBC
HCT: 38.7 % — ABNORMAL LOW (ref 39.0–52.0)
Hemoglobin: 12.2 g/dL — ABNORMAL LOW (ref 13.0–17.0)
MCH: 31.6 pg (ref 26.0–34.0)
MCHC: 31.5 g/dL (ref 30.0–36.0)
MCV: 100.3 fL — ABNORMAL HIGH (ref 80.0–100.0)
Platelets: 87 10*3/uL — ABNORMAL LOW (ref 150–400)
RBC: 3.86 MIL/uL — ABNORMAL LOW (ref 4.22–5.81)
RDW: 12.5 % (ref 11.5–15.5)
WBC: 6.1 10*3/uL (ref 4.0–10.5)
nRBC: 0 % (ref 0.0–0.2)

## 2024-03-28 LAB — URINALYSIS, ROUTINE W REFLEX MICROSCOPIC
Glucose, UA: NEGATIVE mg/dL
Ketones, ur: NEGATIVE mg/dL
Leukocytes,Ua: NEGATIVE
Nitrite: NEGATIVE
Protein, ur: 100 mg/dL — AB
RBC / HPF: >50 RBC/hpf (ref 0–5)
Specific Gravity, Urine: 1.024 (ref 1.005–1.030)
pH: 5 (ref 5.0–8.0)

## 2024-03-28 LAB — RESP PANEL BY RT-PCR (RSV, FLU A&B, COVID)  RVPGX2
Influenza A by PCR: NEGATIVE
Influenza B by PCR: NEGATIVE
Resp Syncytial Virus by PCR: NEGATIVE
SARS Coronavirus 2 by RT PCR: NEGATIVE

## 2024-03-28 LAB — CBG MONITORING, ED: Glucose-Capillary: 121 mg/dL — ABNORMAL HIGH (ref 70–99)

## 2024-03-28 LAB — AMMONIA: Ammonia: 21 umol/L (ref 9–35)

## 2024-03-28 LAB — CK: Total CK: 224 U/L (ref 49–397)

## 2024-03-28 LAB — TSH: TSH: 1.683 u[IU]/mL (ref 0.350–4.500)

## 2024-03-28 MED ORDER — SODIUM CHLORIDE 0.9 % IV BOLUS
1000.0000 mL | Freq: Once | INTRAVENOUS | Status: AC
  Administered 2024-03-28: 1000 mL via INTRAVENOUS

## 2024-03-28 MED ORDER — HYDRALAZINE HCL 20 MG/ML IJ SOLN
10.0000 mg | INTRAMUSCULAR | Status: DC | PRN
  Administered 2024-03-28 – 2024-03-29 (×4): 10 mg via INTRAVENOUS
  Filled 2024-03-28 (×4): qty 1

## 2024-03-28 MED ORDER — DEXTROSE IN LACTATED RINGERS 5 % IV SOLN: INTRAVENOUS | Status: DC

## 2024-03-28 NOTE — ED Notes (Signed)
 Consulted lab, reports can add on CK, TSH, and urine culture to previously collected

## 2024-03-28 NOTE — ED Notes (Signed)
 X RAY at bedside

## 2024-03-28 NOTE — ED Notes (Signed)
 Pt to CT

## 2024-03-28 NOTE — ED Notes (Signed)
 This RN and NT to change pt depends

## 2024-03-28 NOTE — ED Notes (Signed)
 Patient's family Lecil Tapp will accompany patient until his wife and daughter arrive. His number is [phone].

## 2024-03-28 NOTE — ED Notes (Signed)
 Pt BP 207/88, This RN notified attending. Awaiting orders.

## 2024-03-28 NOTE — ED Triage Notes (Signed)
 Pt bib [facility] from home due to pt being sick. When ems arrived pt was hot to touch. Pt wife stated he has been very lethargic and soft spoken which is unusual.  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   18g L forearm

## 2024-03-28 NOTE — ED Provider Notes (Signed)
 [place] EMERGENCY DEPARTMENT AT [facility] Provider Note   CSN: [id number] Arrival date & time: 03/28/24  1356     Patient presents with: Fatigue (/) and Weakness   Dustin Hess is a 78 y.o. male.   The history is provided by the patient and medical records. No language interpreter was used.  Weakness Severity:  Moderate Onset quality:  Gradual Timing:  Constant Progression:  Unchanged Relieved by:  Nothing Worsened by:  Nothing Ineffective treatments:  None tried Associated symptoms: fever, frequency and urgency   Associated symptoms: no abdominal pain, no chest pain, no cough, no diarrhea, no headaches, no nausea, no shortness of breath and no vomiting        Prior to Admission medications   Medication Sig Start Date End Date Taking? Authorizing Provider  acetaminophen  (TYLENOL ) 650 MG CR tablet Take 650 mg by mouth every 8 (eight) hours as needed for pain.    [provider]  amLODipine  (NORVASC ) 5 MG tablet TAKE 1 TABLET (5 MG TOTAL) BY MOUTH DAILY. 09/20/22 05/02/23  Lavona Agent, MD  aspirin  81 MG tablet Take 81 mg by mouth daily.      [provider]  Cholecalciferol (VITAMIN D3) 1000 UNITS CAPS Take 1 capsule by mouth daily.      [provider]  dutasteride  (AVODART ) 0.5 MG capsule Take 0.5 mg by mouth daily.      [provider]  isosorbide  mononitrate (IMDUR ) 120 MG 24 hr tablet TAKE 1 TABLET BY MOUTH EVERY DAY 12/04/23   Lavona Agent, MD  levothyroxine  (SYNTHROID , LEVOTHROID) 112 MCG tablet Take 112 mcg by mouth daily before breakfast.    [provider]  losartan  (COZAAR ) 100 MG tablet TAKE 1 TABLET BY MOUTH EVERY DAY 10/16/21   Lavona Agent, MD  metoprolol  succinate (TOPROL -XL) 100 MG 24 hr tablet TAKE 1 TABLET BY MOUTH DAILY. TAKE WITH OR IMMEDIATELY FOLLOWING A MEAL. 01/21/22   Lavona Agent, MD  Multiple Vitamin (MULTIVITAMIN) tablet Take 1 tablet by mouth daily.    [provider]  nitroGLYCERIN  (NITROSTAT ) 0.4 MG SL tablet Place 1 tablet (0.4 mg total) under the tongue every 5 (five) minutes as needed. Must call and schedule appt for future refills 09/11/23   Lavona Agent, MD  tamsulosin  (FLOMAX ) 0.4 MG CAPS capsule Take 0.4 mg by mouth daily.      [provider]  triamterene -hydrochlorothiazide (DYAZIDE) 37.5-25 MG capsule Take 1 each (1 capsule total) by mouth daily. 01/02/18   Raford Lenis, MD    Allergies: Benadryl [diphenhydramine hcl]    Review of Systems  Constitutional:  Positive for chills, fatigue and fever.  HENT:  Negative for congestion.   Respiratory:  Negative for cough, chest tightness and shortness of breath.   Cardiovascular:  Negative for chest pain, palpitations and leg swelling.  Gastrointestinal:  Negative for abdominal pain, constipation, diarrhea, nausea and vomiting.  Genitourinary:  Positive for frequency and urgency.  Musculoskeletal:  Negative for back pain and neck pain.  Skin:  Negative for wound.  Neurological:  Positive for weakness. Negative for headaches.  Psychiatric/Behavioral:  Negative for agitation.     Updated Vital Signs BP (!) 147/85   Pulse 82   Temp 99.3 F (37.4 C) (Oral)   Resp 13   Ht 5' 3 (1.6 m)   Wt 64.4 kg   SpO2 98%   BMI 25.15 kg/m   Physical Exam Vitals and nursing note reviewed.  Constitutional:  General: He is not in acute distress.    Appearance: He is well-developed.  HENT:     Head: Normocephalic and atraumatic.     Mouth/Throat:     Mouth: Mucous membranes are dry.     Pharynx: No oropharyngeal exudate or posterior oropharyngeal erythema.   Eyes:     Extraocular Movements: Extraocular movements intact.     Conjunctiva/sclera: Conjunctivae normal.     Pupils: Pupils are equal, round, and reactive to light.    Cardiovascular:     Rate and Rhythm: Normal rate and regular rhythm.     Heart sounds: No murmur heard. Pulmonary:     Effort: Pulmonary effort is normal.  No respiratory distress.     Breath sounds: Normal breath sounds. No wheezing, rhonchi or rales.  Chest:     Chest wall: No tenderness.  Abdominal:     Palpations: Abdomen is soft.     Tenderness: There is no abdominal tenderness. There is no guarding or rebound.   Musculoskeletal:        General: No swelling or tenderness.     Cervical back: Neck supple.   Skin:    General: Skin is warm and dry.     Capillary Refill: Capillary refill takes less than 2 seconds.     Findings: No erythema or rash.   Neurological:     General: No focal deficit present.     Mental Status: He is alert.   Psychiatric:        Mood and Affect: Mood normal.     (all labs ordered are listed, but only abnormal results are displayed) Labs Reviewed  COMPREHENSIVE METABOLIC PANEL WITH GFR - Abnormal; Notable for the following components:      Result Value   Sodium 153 (*)    Chloride 114 (*)    Glucose, Bld 128 (*)    BUN 54 (*)    Creatinine, Ser 2.79 (*)    Total Protein 6.4 (*)    Alkaline Phosphatase 30 (*)    Total Bilirubin 1.4 (*)    GFR, Estimated 22 (*)    All other components within normal limits  CBC - Abnormal; Notable for the following components:   RBC 3.86 (*)    Hemoglobin 12.2 (*)    HCT 38.7 (*)    MCV 100.3 (*)    Platelets 87 (*)    All other components within normal limits  URINALYSIS, ROUTINE W REFLEX MICROSCOPIC - Abnormal; Notable for the following components:   Color, Urine AMBER (*)    APPearance CLOUDY (*)    Hgb urine dipstick MODERATE (*)    Bilirubin Urine SMALL (*)    Protein, ur 100 (*)    Bacteria, UA RARE (*)    All other components within normal limits  CBG MONITORING, ED - Abnormal; Notable for the following components:   Glucose-Capillary 121 (*)    All other components within normal limits  RESP PANEL BY RT-PCR (RSV, FLU A&B, COVID)  [id number]  URINE CULTURE  TSH  AMMONIA  CK  COMPREHENSIVE METABOLIC PANEL WITH GFR  CBC WITH DIFFERENTIAL/PLATELET     EKG: EKG Interpretation Date/Time:  Sunday March 28 2024 14:04:06 EDT Ventricular Rate:  82 PR Interval:  135 QRS Duration:  83 QT Interval:  388 QTC Calculation: 454 R Axis:   14  Text Interpretation: Sinus rhythm Biatrial enlargement when compared to prior, similar apperance No STEMI Confirmed by Ginger Barefoot ([id number]) on 03/28/2024 3:07:30 PM  Radiology: CT  RENAL STONE STUDY Result Date: 03/28/2024 CLINICAL DATA:  Acute renal failure EXAM: CT ABDOMEN AND PELVIS WITHOUT CONTRAST TECHNIQUE: Multidetector CT imaging of the abdomen and pelvis was performed following the standard protocol without IV contrast. RADIATION DOSE REDUCTION: This exam was performed according to the departmental dose-optimization program which includes automated exposure control, adjustment of the mA and/or kV according to patient size and/or use of iterative reconstruction technique. COMPARISON:  Remote CT 09/18/2005 FINDINGS: Lower chest: Normal heart size with coronary artery calcifications. Subsegmental right lower lobe atelectasis. Hepatobiliary: Motion artifact limitations. No evidence of focal liver abnormality on this unenhanced exam. Small gallstones layering in the gallbladder. No pericholecystic inflammation. No biliary dilatation. Pancreas: No ductal dilatation or inflammation. Spleen: Normal in size without focal abnormality. Adrenals/Urinary Tract: No adrenal nodule. No hydronephrosis. There are bilateral intrarenal calculi. No perinephric inflammation. No evidence of focal renal abnormality. Decompressed ureters. Partially distended urinary bladder, no bladder wall thickening. Stomach/Bowel: Decompressed stomach. No small bowel obstruction or inflammation. Normal appendix. Moderate volume of stool in the colon. Mild left colonic diverticulosis. No diverticulitis. No colonic inflammation. Vascular/Lymphatic: Advanced aortic and branch atherosclerosis. No aortic aneurysm. No adenopathy. Reproductive: Prostate is  unremarkable. Other: No free air or ascites.  No abdominal wall hernia. Musculoskeletal: There are no acute or suspicious osseous abnormalities. Schmorl's nodes throughout the lumbar spine IMPRESSION: 1. No acute abnormality in the abdomen/pelvis. 2. Bilateral nonobstructing intrarenal calculi. 3. Cholelithiasis without cholecystitis. 4. Mild left colonic diverticulosis without diverticulitis. Aortic Atherosclerosis (ICD10-I70.0). Electronically Signed   By: Andrea Gasman M.D.   On: 03/28/2024 23:07   CT Head Wo Contrast Result Date: 03/28/2024 CLINICAL DATA:  Altered mental status. EXAM: CT HEAD WITHOUT CONTRAST TECHNIQUE: Contiguous axial images were obtained from the base of the skull through the vertex without intravenous contrast. RADIATION DOSE REDUCTION: This exam was performed according to the departmental dose-optimization program which includes automated exposure control, adjustment of the mA and/or kV according to patient size and/or use of iterative reconstruction technique. COMPARISON:  August 28, 2023 FINDINGS: Brain: There is generalized cerebral atrophy with widening of the extra-axial spaces and ventricular dilatation. There are areas of decreased attenuation within the white matter tracts of the supratentorial brain, consistent with microvascular disease changes. Vascular: No hyperdense vessel or unexpected calcification. Skull: Normal. Negative for fracture or focal lesion. Sinuses/Orbits: A 1.9 cm right maxillary sinus polyp versus mucous retention cyst is seen. Postoperative changes are seen involving both globes. Other: None. IMPRESSION: 1. Generalized cerebral atrophy with chronic white matter small vessel ischemic changes. 2. No acute intracranial abnormality. 3. Right maxillary sinus polyp versus mucous retention cyst. Electronically Signed   By: Suzen Dials M.D.   On: 03/28/2024 17:49   DG Chest Portable 1 View Result Date: 03/28/2024 CLINICAL DATA:  Altered mental status,  chills, fatigue and cough. EXAM: PORTABLE CHEST 1 VIEW COMPARISON:  August 28, 2023 FINDINGS: Limited study secondary to patient rotation. The heart size and mediastinal contours are within normal limits. Both lungs are clear. The visualized skeletal structures are unremarkable. IMPRESSION: No active disease. Electronically Signed   By: Suzen Dials M.D.   On: 03/28/2024 16:19     Procedures   Medications Ordered in the ED  dextrose 5 % in lactated ringers  infusion ( Intravenous New Bag/Given 03/28/24 2331)  hydrALAZINE  (APRESOLINE ) injection 10 mg (10 mg Intravenous Given 03/28/24 2327)  sodium chloride  0.9 % bolus 1,000 mL (0 mLs Intravenous Stopped 03/28/24 2220)  Medical Decision Making Amount and/or Complexity of Data Reviewed Labs: ordered. Radiology: ordered.  Risk Decision regarding hospitalization.    Dustin Hess is a 78 y.o. male with past medical history significant for blindness, hypothyroidism, hypertension, GERD, dyslipidemia, CAD with PCI, and previous urinary tract infections who presents with shaking chills, fatigue, decreased oral intake, urinary changes, cough, and confusion.  According to family, patient is normally up and able to ambulate well but over the last several days has been less ambulatory and more fatigued.  He has been having shaking chills for the last several days and per EMS had a temp of 104.  With us  his temp is 99.  He says that he has had some cough and has had some congestion but denies any chest pain or shortness of breath.  He denies any abdominal pain back pain or flank pain.  Reports his urine has been darker and both family and EMS were concerned about UTI.  Nursing said it looks very foul smelling and appearing.  No reported trauma or falls.  He is not any headache or neck pain whatsoever.  Patient had to get help to get off the toilet today because he was too fatigued and tired and they are  worried about him.  On exam, lungs had some coarseness and chest was nontender.  Abdomen nontender.  Patient moving all extremities.  Patient is congenitally blind.  Speech is clear but patient is tired.  No evidence of trauma.  No neck tenderness or rigidity.  No rash seen.  No reported tick bites and not see ticks on him.  Clinical aspect has an infection of some kind given his recent fever and your fever now.  With his urinary history and urinary symptoms I suspect could be UTI but with his cough we will get a chest x-ray as well.  Will get screening labs.  Will get a CT head given the altered mental status although there is no reported trauma and he has no headache, doubt meningitis.  Will check for COVID/flu/RSV given ongoing pandemic and has URI symptoms.  Given his altered mental status, fatigue, and appearance, anticipate admission after workup is completed.  On my initial evaluation, he was afebrile, not tachycardic, not tachypneic, and is not meeting sepsis criteria at this time.  7:45 PM Patient's workup showed dehydration with a new AKI and creatinine of 2.79 when it was normal previously.  Sodium and chloride are also elevated.  His white blood cell count is normal hemoglobin is slightly decreased.  Urinalysis does not show convincing evidence of UTI with no nitrites or leukocytes.  Negative for COVID flu RSV and pneumonia is normal.  CT head and chest x-ray did not show critical finding.  Due to the decreased oral intake, fatigue, malaise, and decreased mobility in the setting of AKI, he will need admission for rehydration.  Will call for admission.       Final diagnoses:  Dehydration  AKI (acute kidney injury) (HCC)    Clinical Impression: 1. Dehydration   2. AKI (acute kidney injury) (HCC)     Disposition: Admit  This note was prepared with assistance of Dragon voice recognition software. Occasional wrong-word or sound-a-like substitutions may have occurred due to the  inherent limitations of voice recognition software.      Kiyla Ringler, Lonni PARAS, MD 03/29/24 [place]

## 2024-03-28 NOTE — H&P (Incomplete)
 History and Physical    Dustin Hess FMW:[id number] DOB: 06/08/1946 DOA: 03/28/2024  Patient coming from: Home.  Chief Complaint: Weakness.  HPI: ELEX MAINWARING is a 78 y.o. male with history of CAD status post PCI in 2005, hypertension, hypothyroidism, blindness, anemia, BPH was brought to the ER after patient was found to be increasingly weak not eating well lethargic some confusion for the last 2 to 3 days.  History was provided by patient's wife.  Per wife patient did not have any nausea vomiting or diarrhea did not complain of any chest pain or abdominal pain.  Was refusing to eat because of poor appetite and became more weak and tired and lethargic.  Was brought to the ER.  ED Course: In the ER CT head is unremarkable UA and chest x-ray was not showing anything acute.  Labs show acute renal failure with hypernatremia was started on fluids for dehydration admitted for further observation.  Review of Systems: As per HPI, rest all negative.   Past Medical History:  Diagnosis Date   Anemia, unspecified 12/22/2013   Arthritis    Benign prostatic hypertrophy    Congenital blindness    Coronary artery disease    a. 06/2004 Inf STEMI with PCI/BMS to RCA/LCX;  b. Cath 2014 40-50% focal bradycardia circ stenosis with a patent stent, RCA 50% stenosis, distal 60% stenosis in a previously stented area. 06/2011 Neg MV, EF 67%.   Dyslipidemia    GERD (gastroesophageal reflux disease)    GI bleeding 2006   Hypertension    Hypothyroidism    Leukopenia 12/22/2013    Past Surgical History:  Procedure Laterality Date   COLONOSCOPY     CORONARY ANGIOPLASTY WITH STENT PLACEMENT     ESOPHAGOGASTRODUODENOSCOPY     HEMORRHOID SURGERY     LEFT HEART CATHETERIZATION WITH CORONARY ANGIOGRAM N/A 04/30/2013   Procedure: LEFT HEART CATHETERIZATION WITH CORONARY ANGIOGRAM;  Surgeon: Ozell Fell, MD;  Location: [facility] CATH LAB;  Service: Cardiovascular;  Laterality: N/A;     reports that he  has never smoked. He has never used smokeless tobacco. He reports that he does not drink alcohol and does not use drugs.  Allergies  Allergen Reactions   Benadryl [Diphenhydramine Hcl] Palpitations    2014  Took one tablet and had heart palpitations    Family History  Problem Relation Age of Onset   Cancer Mother 57       kidney   Cancer Father 67       blood cancer   Heart attack Cousin        died in her 32's.    Prior to Admission medications   Medication Sig Start Date End Date Taking? Authorizing Provider  acetaminophen  (TYLENOL ) 650 MG CR tablet Take 650 mg by mouth every 8 (eight) hours as needed for pain.    [provider]  amLODipine  (NORVASC ) 5 MG tablet TAKE 1 TABLET (5 MG TOTAL) BY MOUTH DAILY. 09/20/22 05/02/23  Lavona Agent, MD  aspirin  81 MG tablet Take 81 mg by mouth daily.      [provider]  Cholecalciferol (VITAMIN D3) 1000 UNITS CAPS Take 1 capsule by mouth daily.      [provider]  dutasteride  (AVODART ) 0.5 MG capsule Take 0.5 mg by mouth daily.      [provider]  isosorbide  mononitrate (IMDUR ) 120 MG 24 hr tablet TAKE 1 TABLET BY MOUTH EVERY DAY 12/04/23   Lavona Agent, MD  levothyroxine  (SYNTHROID , LEVOTHROID) 112  MCG tablet Take 112 mcg by mouth daily before breakfast.    [provider]  losartan  (COZAAR ) 100 MG tablet TAKE 1 TABLET BY MOUTH EVERY DAY 10/16/21   Lavona Agent, MD  metoprolol  succinate (TOPROL -XL) 100 MG 24 hr tablet TAKE 1 TABLET BY MOUTH DAILY. TAKE WITH OR IMMEDIATELY FOLLOWING A MEAL. 01/21/22   Lavona Agent, MD  Multiple Vitamin (MULTIVITAMIN) tablet Take 1 tablet by mouth daily.    [provider]  nitroGLYCERIN  (NITROSTAT ) 0.4 MG SL tablet Place 1 tablet (0.4 mg total) under the tongue every 5 (five) minutes as needed. Must call and schedule appt for future refills 09/11/23   Lavona Agent, MD  tamsulosin  (FLOMAX ) 0.4 MG CAPS capsule Take 0.4 mg by mouth daily.       [provider]  triamterene -hydrochlorothiazide (DYAZIDE) 37.5-25 MG capsule Take 1 each (1 capsule total) by mouth daily. 01/02/18   Raford Lenis, MD    Physical Exam: Constitutional: Moderately built and nourished. Vitals:   03/28/24 1816 03/28/24 1830 03/28/24 1945 03/28/24 2115  BP: (!) 184/78 (!) 187/81 (!) 189/78 (!) 189/90  Pulse: 64 68 67 63  Resp: 13 13 14 13   Temp: 97.7 F (36.5 C)     TempSrc: Oral     SpO2: 100% 100% 100% 100%  Weight:      Height:       Eyes: Patient has blindness of both eyes. ENMT: No discharge from the ears eyes nose or mouth. Neck: No mass felt.  No neck rigidity. Respiratory: No rhonchi or crepitations. Cardiovascular: S1-S2 heard. Abdomen: Soft nontender bowel sound present. Musculoskeletal: No edema. Skin: No rash. Neurologic: Alert awake oriented to his name and place moving all extremities. Psychiatric: Oriented to name and place.   Labs on Admission: I have personally reviewed following labs and imaging studies  CBC: Recent Labs  Lab 03/28/24 1410  WBC 6.1  HGB 12.2*  HCT 38.7*  MCV 100.3*  PLT 87*   Basic Metabolic Panel: Recent Labs  Lab 03/28/24 1410  NA 153*  K 3.7  CL 114*  CO2 24  GLUCOSE 128*  BUN 54*  CREATININE 2.79*  CALCIUM  9.4   GFR: Estimated Creatinine Clearance: 17.6 mL/min (A) (by C-G formula based on SCr of 2.79 mg/dL (H)). Liver Function Tests: Recent Labs  Lab 03/28/24 1410  AST 19  ALT 13  ALKPHOS 30*  BILITOT 1.4*  PROT 6.4*  ALBUMIN 3.7   No results for input(s): LIPASE, AMYLASE in the last 168 hours. Recent Labs  Lab 03/28/24 1515  AMMONIA 21   Coagulation Profile: No results for input(s): INR, PROTIME in the last 168 hours. Cardiac Enzymes: Recent Labs  Lab 03/28/24 1410  CKTOTAL 224   BNP (last 3 results) No results for input(s): PROBNP in the last 8760 hours. HbA1C: No results for input(s): HGBA1C in the last 72 hours. CBG: Recent Labs  Lab  03/28/24 1433  GLUCAP 121*   Lipid Profile: No results for input(s): CHOL, HDL, LDLCALC, TRIG, CHOLHDL, LDLDIRECT in the last 72 hours. Thyroid  Function Tests: Recent Labs    03/28/24 1410  TSH 1.683   Anemia Panel: No results for input(s): VITAMINB12, FOLATE, FERRITIN, TIBC, IRON, RETICCTPCT in the last 72 hours. Urine analysis:    Component Value Date/Time   COLORURINE AMBER (A) 03/28/2024 1410   APPEARANCEUR CLOUDY (A) 03/28/2024 1410   LABSPEC 1.024 03/28/2024 1410   PHURINE 5.0 03/28/2024 1410   GLUCOSEU NEGATIVE 03/28/2024 1410   HGBUR MODERATE (A)  03/28/2024 1410   BILIRUBINUR SMALL (A) 03/28/2024 1410   KETONESUR NEGATIVE 03/28/2024 1410   PROTEINUR 100 (A) 03/28/2024 1410   UROBILINOGEN 1.0 12/28/2007 0801   NITRITE NEGATIVE 03/28/2024 1410   LEUKOCYTESUR NEGATIVE 03/28/2024 1410   Sepsis Labs: @LABRCNTIP (procalcitonin:4,lacticidven:4) ) Recent Results (from the past 240 hours)  Resp panel by RT-PCR (RSV, Flu A&B, Covid) Anterior Nasal Swab     Status: None   Collection Time: 03/28/24  3:15 PM   Specimen: Anterior Nasal Swab  Result Value Ref Range Status   SARS Coronavirus 2 by RT PCR NEGATIVE NEGATIVE Final   Influenza A by PCR NEGATIVE NEGATIVE Final   Influenza B by PCR NEGATIVE NEGATIVE Final    Comment: (NOTE) The Xpert Xpress SARS-CoV-2/FLU/RSV plus assay is intended as an aid in the diagnosis of influenza from Nasopharyngeal swab specimens and should not be used as a sole basis for treatment. Nasal washings and aspirates are unacceptable for Xpert Xpress SARS-CoV-2/FLU/RSV testing.  Fact Sheet for Patients: BloggerCourse.com  Fact Sheet for Healthcare Providers: SeriousBroker.it  This test is not yet approved or cleared by the [place]  FDA and has been authorized for detection and/or diagnosis of SARS-CoV-2 by FDA under an Emergency Use Authorization (EUA). This EUA  will remain in effect (meaning this test can be used) for the duration of the COVID-19 declaration under Section 564(b)(1) of the Act, 21 U.S.C. section 360bbb-3(b)(1), unless the authorization is terminated or revoked.     Resp Syncytial Virus by PCR NEGATIVE NEGATIVE Final    Comment: (NOTE) Fact Sheet for Patients: BloggerCourse.com  Fact Sheet for Healthcare Providers: SeriousBroker.it  This test is not yet approved or cleared by the [place]  FDA and has been authorized for detection and/or diagnosis of SARS-CoV-2 by FDA under an Emergency Use Authorization (EUA). This EUA will remain in effect (meaning this test can be used) for the duration of the COVID-19 declaration under Section 564(b)(1) of the Act, 21 U.S.C. section 360bbb-3(b)(1), unless the authorization is terminated or revoked.  Performed at [facility], [address] [place], [place] [address]      Radiological Exams on Admission: CT Head Wo Contrast Result Date: 03/28/2024 CLINICAL DATA:  Altered mental status. EXAM: CT HEAD WITHOUT CONTRAST TECHNIQUE: Contiguous axial images were obtained from the base of the skull through the vertex without intravenous contrast. RADIATION DOSE REDUCTION: This exam was performed according to the departmental dose-optimization program which includes automated exposure control, adjustment of the mA and/or kV according to patient size and/or use of iterative reconstruction technique. COMPARISON:  August 28, 2023 FINDINGS: Brain: There is generalized cerebral atrophy with widening of the extra-axial spaces and ventricular dilatation. There are areas of decreased attenuation within the white matter tracts of the supratentorial brain, consistent with microvascular disease changes. Vascular: No hyperdense vessel or unexpected calcification. Skull: Normal. Negative for fracture or focal lesion. Sinuses/Orbits: A 1.9 cm right  maxillary sinus polyp versus mucous retention cyst is seen. Postoperative changes are seen involving both globes. Other: None. IMPRESSION: 1. Generalized cerebral atrophy with chronic white matter small vessel ischemic changes. 2. No acute intracranial abnormality. 3. Right maxillary sinus polyp versus mucous retention cyst. Electronically Signed   By: Suzen Dials M.D.   On: 03/28/2024 17:49   DG Chest Portable 1 View Result Date: 03/28/2024 CLINICAL DATA:  Altered mental status, chills, fatigue and cough. EXAM: PORTABLE CHEST 1 VIEW COMPARISON:  August 28, 2023 FINDINGS: Limited study secondary to patient rotation. The heart size  and mediastinal contours are within normal limits. Both lungs are clear. The visualized skeletal structures are unremarkable. IMPRESSION: No active disease. Electronically Signed   By: Suzen Dials M.D.   On: 03/28/2024 16:19    EKG: Independently reviewed.  Normal sinus rhythm.  Assessment/Plan Principal Problem:   ARF (acute renal failure) (HCC) Active Problems:   Hypothyroidism   Essential hypertension, benign   Blindness, congenital   Benign prostatic hyperplasia   Thrombocytopenia, unspecified (HCC)   CAD (coronary artery disease) of artery bypass graft   Hypernatremia    Acute renal failure with hypernatremia likely from poor oral intake and dehydration.  Will gently hydrate follow metabolic panel.  Check CT abdomen pelvis to make sure there is no obstruction and also since patient is having poor oral intake we will look for any intra-abdominal cause. Acute encephalopathy has improved likely from dehydration.  Will continue to closely monitor. Hypertension takes Imdur  and metoprolol .  Will check speech therapy evaluation before starting oral medications. Hypothyroidism on Synthroid .  Restart Synthroid  once patient passes swallow evaluation. Chronic anemia with macrocytosis check anemia panel.  Follow CBC. Chronic thrombocytopenia follow  CBC. History of CAD status post PCI has not complained of any chest pain.  Takes aspirin  Imdur  statins and beta-blockers.  Restart once patient is able to take orally.  Will keep patient on for rectal aspirin  until patient can take orally. History of BPH on Avodart  and Flomax . Blindness in both eyes.  Since patient has acute renal failure with hyponatremia will need hydration and further workup and more than 2 midnight stay.   DVT prophylaxis: SCDs.  Patient has thrombocytopenia. Code Status: Full code. Family Communication: Patient's wife. Disposition Plan: Monitored bed. Consults called: Speech therapy. Admission status: Observation.

## 2024-03-29 ENCOUNTER — Observation Stay (HOSPITAL_COMMUNITY)

## 2024-03-29 DIAGNOSIS — E785 Hyperlipidemia, unspecified: Secondary | ICD-10-CM | POA: Diagnosis present

## 2024-03-29 DIAGNOSIS — Z955 Presence of coronary angioplasty implant and graft: Secondary | ICD-10-CM | POA: Diagnosis not present

## 2024-03-29 DIAGNOSIS — E871 Hypo-osmolality and hyponatremia: Secondary | ICD-10-CM | POA: Diagnosis present

## 2024-03-29 DIAGNOSIS — R5381 Other malaise: Secondary | ICD-10-CM | POA: Diagnosis not present

## 2024-03-29 DIAGNOSIS — H543 Unqualified visual loss, both eyes: Secondary | ICD-10-CM | POA: Diagnosis present

## 2024-03-29 DIAGNOSIS — E039 Hypothyroidism, unspecified: Secondary | ICD-10-CM | POA: Diagnosis present

## 2024-03-29 DIAGNOSIS — E538 Deficiency of other specified B group vitamins: Secondary | ICD-10-CM | POA: Diagnosis present

## 2024-03-29 DIAGNOSIS — I10 Essential (primary) hypertension: Secondary | ICD-10-CM | POA: Diagnosis present

## 2024-03-29 DIAGNOSIS — I25810 Atherosclerosis of coronary artery bypass graft(s) without angina pectoris: Secondary | ICD-10-CM | POA: Diagnosis present

## 2024-03-29 DIAGNOSIS — Z751 Person awaiting admission to adequate facility elsewhere: Secondary | ICD-10-CM | POA: Diagnosis not present

## 2024-03-29 DIAGNOSIS — I2510 Atherosclerotic heart disease of native coronary artery without angina pectoris: Secondary | ICD-10-CM | POA: Diagnosis present

## 2024-03-29 DIAGNOSIS — N179 Acute kidney failure, unspecified: Secondary | ICD-10-CM | POA: Diagnosis present

## 2024-03-29 DIAGNOSIS — Z1152 Encounter for screening for COVID-19: Secondary | ICD-10-CM | POA: Diagnosis not present

## 2024-03-29 DIAGNOSIS — E870 Hyperosmolality and hypernatremia: Secondary | ICD-10-CM | POA: Diagnosis present

## 2024-03-29 DIAGNOSIS — R1310 Dysphagia, unspecified: Secondary | ICD-10-CM | POA: Diagnosis present

## 2024-03-29 DIAGNOSIS — K8020 Calculus of gallbladder without cholecystitis without obstruction: Secondary | ICD-10-CM | POA: Diagnosis present

## 2024-03-29 DIAGNOSIS — Z8249 Family history of ischemic heart disease and other diseases of the circulatory system: Secondary | ICD-10-CM | POA: Diagnosis not present

## 2024-03-29 DIAGNOSIS — E611 Iron deficiency: Secondary | ICD-10-CM | POA: Diagnosis present

## 2024-03-29 DIAGNOSIS — N400 Enlarged prostate without lower urinary tract symptoms: Secondary | ICD-10-CM | POA: Diagnosis present

## 2024-03-29 DIAGNOSIS — D649 Anemia, unspecified: Secondary | ICD-10-CM | POA: Diagnosis present

## 2024-03-29 DIAGNOSIS — G9341 Metabolic encephalopathy: Secondary | ICD-10-CM | POA: Diagnosis present

## 2024-03-29 DIAGNOSIS — G319 Degenerative disease of nervous system, unspecified: Secondary | ICD-10-CM | POA: Diagnosis present

## 2024-03-29 DIAGNOSIS — E860 Dehydration: Secondary | ICD-10-CM | POA: Diagnosis present

## 2024-03-29 DIAGNOSIS — Z79890 Hormone replacement therapy (postmenopausal): Secondary | ICD-10-CM | POA: Diagnosis not present

## 2024-03-29 DIAGNOSIS — D696 Thrombocytopenia, unspecified: Secondary | ICD-10-CM | POA: Diagnosis present

## 2024-03-29 LAB — RETICULOCYTES
Immature Retic Fract: 13.1 % (ref 2.3–15.9)
RBC.: 4.25 MIL/uL (ref 4.22–5.81)
Retic Count, Absolute: 53.6 10*3/uL (ref 19.0–186.0)
Retic Ct Pct: 1.3 % (ref 0.4–3.1)

## 2024-03-29 LAB — BASIC METABOLIC PANEL WITH GFR
Anion gap: 10 (ref 5–15)
Anion gap: 10 (ref 5–15)
BUN: 38 mg/dL — ABNORMAL HIGH (ref 8–23)
BUN: 34 mg/dL — ABNORMAL HIGH (ref 8–23)
CO2: 25 mmol/L (ref 22–32)
CO2: 25 mmol/L (ref 22–32)
Calcium: 9.3 mg/dL (ref 8.9–10.3)
Calcium: 9.6 mg/dL (ref 8.9–10.3)
Chloride: 115 mmol/L — ABNORMAL HIGH (ref 98–111)
Chloride: 116 mmol/L — ABNORMAL HIGH (ref 98–111)
Creatinine, Ser: 1.12 mg/dL (ref 0.61–1.24)
Creatinine, Ser: 0.95 mg/dL (ref 0.61–1.24)
GFR, Estimated: >60 mL/min (ref >60)
GFR, Estimated: >60 mL/min (ref >60)
Glucose, Bld: 119 mg/dL — ABNORMAL HIGH (ref 70–99)
Glucose, Bld: 125 mg/dL — ABNORMAL HIGH (ref 70–99)
Potassium: 3.6 mmol/L (ref 3.5–5.1)
Potassium: 3.6 mmol/L (ref 3.5–5.1)
Sodium: 150 mmol/L — ABNORMAL HIGH (ref 135–145)
Sodium: 151 mmol/L — ABNORMAL HIGH (ref 135–145)

## 2024-03-29 LAB — CBC WITH DIFFERENTIAL/PLATELET
Abs Immature Granulocytes: 0.02 10*3/uL (ref 0.00–0.07)
Basophils Absolute: 0 10*3/uL (ref 0.0–0.1)
Basophils Relative: 0 %
Eosinophils Absolute: 0 10*3/uL (ref 0.0–0.5)
Eosinophils Relative: 0 %
HCT: 41.4 % (ref 39.0–52.0)
Hemoglobin: 13.6 g/dL (ref 13.0–17.0)
Immature Granulocytes: 0 %
Lymphocytes Relative: 8 %
Lymphs Abs: 0.5 10*3/uL — ABNORMAL LOW (ref 0.7–4.0)
MCH: 31.7 pg (ref 26.0–34.0)
MCHC: 32.9 g/dL (ref 30.0–36.0)
MCV: 96.5 fL (ref 80.0–100.0)
Monocytes Absolute: 0.5 10*3/uL (ref 0.1–1.0)
Monocytes Relative: 9 %
Neutro Abs: 4.9 10*3/uL (ref 1.7–7.7)
Neutrophils Relative %: 83 %
Platelets: 82 10*3/uL — ABNORMAL LOW (ref 150–400)
RBC: 4.29 MIL/uL (ref 4.22–5.81)
RDW: 12.1 % (ref 11.5–15.5)
WBC: 6 10*3/uL (ref 4.0–10.5)
nRBC: 0 % (ref 0.0–0.2)

## 2024-03-29 LAB — COMPREHENSIVE METABOLIC PANEL WITH GFR
ALT: 15 U/L (ref 0–44)
AST: 25 U/L (ref 15–41)
Albumin: 3.7 g/dL (ref 3.5–5.0)
Alkaline Phosphatase: 34 U/L — ABNORMAL LOW (ref 38–126)
Anion gap: 9 (ref 5–15)
BUN: 45 mg/dL — ABNORMAL HIGH (ref 8–23)
CO2: 25 mmol/L (ref 22–32)
Calcium: 9.2 mg/dL (ref 8.9–10.3)
Chloride: 115 mmol/L — ABNORMAL HIGH (ref 98–111)
Creatinine, Ser: 1.37 mg/dL — ABNORMAL HIGH (ref 0.61–1.24)
GFR, Estimated: 53 mL/min — ABNORMAL LOW (ref >60)
Glucose, Bld: 122 mg/dL — ABNORMAL HIGH (ref 70–99)
Potassium: 3.4 mmol/L — ABNORMAL LOW (ref 3.5–5.1)
Sodium: 149 mmol/L — ABNORMAL HIGH (ref 135–145)
Total Bilirubin: 1.9 mg/dL — ABNORMAL HIGH (ref 0.0–1.2)
Total Protein: 6.9 g/dL (ref 6.5–8.1)

## 2024-03-29 LAB — FERRITIN: Ferritin: 211 ng/mL (ref 24–336)

## 2024-03-29 LAB — IRON AND TIBC
Iron: 35 ug/dL — ABNORMAL LOW (ref 45–182)
Saturation Ratios: 15 % — ABNORMAL LOW (ref 17.9–39.5)
TIBC: 234 ug/dL — ABNORMAL LOW (ref 250–450)
UIBC: 199 ug/dL

## 2024-03-29 LAB — URINE CULTURE: Culture: <10000 — AB

## 2024-03-29 LAB — VITAMIN B12: Vitamin B-12: 399 pg/mL (ref 180–914)

## 2024-03-29 LAB — FOLATE: Folate: 28.7 ng/mL (ref >5.9)

## 2024-03-29 MED ORDER — CYANOCOBALAMIN 1000 MCG/ML IJ SOLN
1000.0000 ug | Freq: Every day | INTRAMUSCULAR | Status: AC
  Administered 2024-03-29 – 2024-03-31 (×3): 1000 ug via SUBCUTANEOUS
  Filled 2024-03-29 (×3): qty 1

## 2024-03-29 MED ORDER — DEXTROSE IN LACTATED RINGERS 5 % IV SOLN: INTRAVENOUS | Status: DC

## 2024-03-29 MED ORDER — DUTASTERIDE 0.5 MG PO CAPS
0.5000 mg | ORAL_CAPSULE | Freq: Every day | ORAL | Status: DC
  Administered 2024-03-29 – 2024-04-05 (×8): 0.5 mg via ORAL
  Filled 2024-03-29 (×10): qty 1

## 2024-03-29 MED ORDER — LEVOTHYROXINE SODIUM 88 MCG PO TABS: 88.0000 ug | ORAL_TABLET | Freq: Every day | ORAL | Status: DC

## 2024-03-29 MED ORDER — LEVOTHYROXINE SODIUM 88 MCG PO TABS
88.0000 ug | ORAL_TABLET | Freq: Every day | ORAL | Status: DC
  Administered 2024-03-30 – 2024-04-05 (×7): 88 ug via ORAL
  Filled 2024-03-29 (×8): qty 1

## 2024-03-29 MED ORDER — ASPIRIN 300 MG RE SUPP: 150.0000 mg | Freq: Every day | RECTAL | Status: DC

## 2024-03-29 MED ORDER — THIAMINE HCL 100 MG/ML IJ SOLN
100.0000 mg | Freq: Every day | INTRAMUSCULAR | Status: DC
  Administered 2024-03-29 – 2024-03-30 (×2): 100 mg via INTRAVENOUS
  Filled 2024-03-29 (×2): qty 2

## 2024-03-29 MED ORDER — DEXTROSE-SODIUM CHLORIDE 5-0.45 % IV SOLN: INTRAVENOUS | Status: AC

## 2024-03-29 MED ORDER — ASPIRIN 81 MG PO CHEW
81.0000 mg | CHEWABLE_TABLET | Freq: Every day | ORAL | Status: DC
  Administered 2024-03-29 – 2024-04-05 (×8): 81 mg via ORAL
  Filled 2024-03-29 (×8): qty 1

## 2024-03-29 MED ORDER — TAMSULOSIN HCL 0.4 MG PO CAPS
0.8000 mg | ORAL_CAPSULE | Freq: Every day | ORAL | Status: DC
  Administered 2024-03-30 – 2024-04-05 (×7): 0.8 mg via ORAL
  Filled 2024-03-29 (×7): qty 2

## 2024-03-29 MED ORDER — ASPIRIN 81 MG PO TBEC: 81.0000 mg | DELAYED_RELEASE_TABLET | Freq: Every day | ORAL | Status: DC

## 2024-03-29 NOTE — Plan of Care (Signed)

## 2024-03-29 NOTE — Progress Notes (Signed)
 [facility] Progress Note Patient: Dustin Hess FMW:[id number] DOB: 16-Dec-1945 DOA: 03/28/2024  DOS: the patient was seen and examined on 03/29/2024  Brief Hospital Course: Patient with PMH of CAD, HTN, hypothyroidism, chronic blindness, anemia presented to the hospital with complaints of l being ethargic. Found to have AKI with hypernatremia. Note from ENT just a few days ago on 6/26 suggest that he is doing well. Assessment and Plan: Acute metabolic encephalopathy. Secondary to AKI as well as hyponatremia. Mentation improving. Patient able to follow commands CT head unremarkable for any acute abnormality. No focal deficit no asterixis on my exam. For now we will monitor clinically for improvement in mentation with correction of sodium.  Acute kidney injury. Hypernatremia. Likely from poor p.o. intake although etiology is not clear. CT scan negative for any acute abnormality. Currently on a dysphagia diet.  Will monitor for improvement in oral intake. Initiate protein supplements. Currently continue with IV fluid although changing from D5 LR to D5 half-normal saline as sodium is going up. Baseline serum creatinine 1.1.  On admission serum creatinine 2.79.  Almost back to baseline now.  Hyperbilirubinemia Cholelithiasis with CBD ULN-6 to 7 mm No abdominal tenderness on examination. CT scan shows evidence of cholelithiasis. CBD 6 to 7 mm without any evidence of filling defect on ultrasound. No evidence of cholelithiasis. Monitor clinically for now. Daily CMP  Hypothyroidism. TSH was WNL.  1.63. Continuing Synthroid .  HTN. Patient takes Imdur , metoprolol  at home. Currently blood pressure is soft therefore holding this medication. Continue hydralazine  as needed.  Microcytosis. Relative B12 deficiency. Iron deficiency Chronic thrombocytopenia Hemoglobin is stable.  Between 48 and 13. MCV is more than 90. B12 is relatively low.  389 Folic acid is normal.   28.7 Iron 35. Initiate supplementation. Platelet count have been chronically low in 130s to 120s. Currently platelet count is in 80s. No abnormality on the smear. Monitor.  CAD.  HLD No chest pain.  EKG unremarkable. Continue aspirin .  Hold Lipitor.  BPH. Resuming home medication.  Dysphagia Underwent modified barium's study. Currently on full liquid diet.  Blindness Cerebral atrophy Chronic blindness.  Cerebral atrophy noted on the CT scan.  No focal deficit.  Monitor.   Subjective: Able to follow commands.  Denies any acute complaint.  No nausea no vomiting.  Physical Exam: General: in Mild distress, No Rash oral mucosa is dry Cardiovascular: S1 and S2 Present, No Murmur Respiratory: Good respiratory effort, Bilateral Air entry present. No Crackles, No wheezes Abdomen: Bowel Sound present, No tenderness Extremities: No edema Neuro: Alert and oriented x3, no new focal deficit, chronic blindness.  Data Reviewed: I have Reviewed nursing notes, Vitals, and Lab results. Since last encounter, pertinent lab results CBC and BMP   . I have ordered test including CBC and CMP  .   Disposition: Status is: Inpatient Remains inpatient appropriate because: Monitor for improvement in sodium level and mentation  SCDs Start: 03/28/24 2125 holding DVT prophylaxis chemically secondary to thrombocytopenia.   Family Communication: No one at bedside Level of care: Telemetry Medical   Vitals:   03/29/24 0342 03/29/24 0814 03/29/24 1201 03/29/24 1600  BP: 135/62 (!) 141/94 (!) 177/84 137/68  Pulse: 81 82 73 80  Resp: 18 16 15 18   Temp:  98.4 F (36.9 C) 97.8 F (36.6 C) 98.8 F (37.1 C)  TempSrc:    Oral  SpO2: 100% 100% 100% 100%  Weight:      Height:         Author: Amali Uhls  Tobie, MD 03/29/2024 5:19 PM  Please look on [email or web address] to find out who is on call.

## 2024-03-29 NOTE — Plan of Care (Signed)
  Problem: SLP Dysphagia Goals Goal: Misc Dysphagia Goal Flowsheets (Taken 03/29/2024 0845) Misc Dysphagia Goal: Pt will complete an instrumental swallow assessment to objectively assess pt's swallow function and make safest PO diet recommendation.

## 2024-03-29 NOTE — Hospital Course (Addendum)
 Dustin Hess is a 78 yo male with PMH chronic full bilateral blindness, CAD, HTN, hypothyroidism, anemia presented to the hospital with complaints of being lethargic. Found to have AKI with hypernatremia. He was also recently seen at ENT office on 03/25/2024 without notes of complaints at that time.  Assessment and Plan:  Physical deconditioning - awaiting SNF placement  Acute metabolic encephalopathy -resolved Secondary to AKI as well as hypernatremia Mentation improved.  Follows commands easily now CT head unremarkable for any acute abnormality - MRI brain also unremarkable, notably no stroke At baseline per son patient ambulates without any assistance, able to feed himself and mostly independent for his ADLs  AKI-resolved Hypernatremia-resolved Likely from poor p.o. intake although etiology is not clear CT scan negative for any acute abnormality - SLP following, advance diet as able -Creatinine 2.79 on admission which rapidly improved with fluids -Sodium levels also have normalized  Hyperbilirubinemia Cholelithiasis with CBD ULN-6 to 7 mm No abdominal tenderness on examination CT scan shows evidence of cholelithiasis CBD 6 to 7 mm without any evidence of filling defect on ultrasound  Hypothyroidism TSH was normal, 1.63 Continue Synthroid   HTN Patient takes Imdur , metoprolol  at home Currently blood pressure is soft therefore holding this medication Continue hydralazine  as needed  Macrocytosis Relative B12 deficiency Iron deficiency Chronic thrombocytopenia Hemoglobin is stable.  Between 12 and 13 MCV is more than 90.  Does not have microcytosis. B12 is relatively low.  389 Folic acid is normal.  28.7 Iron 35 Initiate supplementation Platelet count have been chronically low in 130s to 120s Currently platelet count is in 80s No abnormality on the smear  CAD HLD No chest pain.  EKG unremarkable Continue aspirin  On lipitor at home   BPH - Continue Flomax   and dutasteride   Dysphagia Underwent modified barium's study this admission -Continue dysphagia diet, advancement per SLP  Chronic bilateral complete blindness Cerebral atrophy Cerebral atrophy noted on the CT scan.  MRI brain also unremarkable. No focal deficit

## 2024-03-29 NOTE — Progress Notes (Signed)
 0330  BSE for Swallow study postponed as pt needed to be NPO for U/Sound.  0505 Abdominal U/Sound been performed at bedside currently.

## 2024-03-29 NOTE — Evaluation (Signed)
 Clinical/Bedside Swallow Evaluation Patient Details  Name: Dustin Hess MRN: [id number] Date of Birth: 26-Feb-1946  Today's Date: 03/29/2024 Time: SLP Start Time (ACUTE ONLY): 0736 SLP Stop Time (ACUTE ONLY): 0755 SLP Time Calculation (min) (ACUTE ONLY): 19 min  Past Medical History:  Past Medical History:  Diagnosis Date   Anemia, unspecified 12/22/2013   Arthritis    Benign prostatic hypertrophy    Congenital blindness    Coronary artery disease    a. 06/2004 Inf STEMI with PCI/BMS to RCA/LCX;  b. Cath 2014 40-50% focal bradycardia circ stenosis with a patent stent, RCA 50% stenosis, distal 60% stenosis in a previously stented area. 06/2011 Neg MV, EF 67%.   Dyslipidemia    GERD (gastroesophageal reflux disease)    GI bleeding 2006   Hypertension    Hypothyroidism    Leukopenia 12/22/2013   Past Surgical History:  Past Surgical History:  Procedure Laterality Date   COLONOSCOPY     CORONARY ANGIOPLASTY WITH STENT PLACEMENT     ESOPHAGOGASTRODUODENOSCOPY     HEMORRHOID SURGERY     LEFT HEART CATHETERIZATION WITH CORONARY ANGIOGRAM N/A 04/30/2013   Procedure: LEFT HEART CATHETERIZATION WITH CORONARY ANGIOGRAM;  Surgeon: Ozell Fell, MD;  Location: [facility] CATH LAB;  Service: Cardiovascular;  Laterality: N/A;   HPI:  Dustin Hess is a 78 y.o. male with history of CAD status post PCI in 2005, hypertension, hypothyroidism, blindness, anemia, BPH was brought to the ER after patient was found to be increasingly weak not eating well lethargic some confusion for the last 2 to 3 days.  History was provided by patient's wife.  Per wife patient did not have any nausea vomiting or diarrhea did not complain of any chest pain or abdominal pain.  Was refusing to eat because of poor appetite and became more weak and tired and lethargic.    Assessment / Plan / Recommendation  Clinical Impression   Pt presents with a moderate oral dysphagia and concerns for a pharyngeal dysphagia per  clinical swallow assessment completed today. No clear hx of dysphagia per EMR. [name] wife, via phone, denied significant hx and reported oral holding started 1-2 days prior to hospital admission. Pt passed nursing swallow screen with night shift; however, day shift RN expressed concerns and requested SLP re-assess prior to pt receiving breakfast.   Pt received sitting upright in bed neck hyperextended. Pt alert to voice, though remained lethargic throughout assessment. He is able to answer simple questions about family (I.e. wife's name) with delay. Oral deficits consisted of reduced oral opening for acceptance of PO trials, oral holding of liquids and applesauce, and prolonged oral transit. Concerns for pharyngeal dysphagia included multiple swallow (up to x5) for single sip of thin liquids or single bite of applesauce. Hyolaryngeal elevation appeared reduced to palpation. No overt s/s of aspiration observed; however, this writer is concerned for possibility of pharyngeal residue and/or risk of silent aspiration given pt's deconditioned state.   Discussed recommendation for instrumental swallow study with pt and RN. SLP called pt's wife, Naomie, and reviewed options for MBSS or FEES. Dorothy provided consent to proceed with FEES; however, pt did not tolerate gentle nostril stimulation of cotton swap. FEES attempt was abandoned.   Plan for MBSS later today at 1400 as pt is able to participate. Recommend NPO with exception of ice chips until MBSS can be completed. RN updated.   SLP Visit Diagnosis: Dysphagia, unspecified (R13.10)    Aspiration Risk  Mild aspiration risk    Diet  Recommendation NPO;Ice chips PRN after oral care;NPO except meds (pertinent meds only)    Liquid Administration via: Spoon Medication Administration: Crushed with puree Supervision: Full supervision/cueing for compensatory strategies Postural Changes: Seated upright at 90 degrees    Other  Recommendations Oral Care  Recommendations: Oral care QID     Assistance Recommended at Discharge    Functional Status Assessment Patient has had a recent decline in their functional status and demonstrates the ability to make significant improvements in function in a reasonable and predictable amount of time.  Frequency and Duration min 2x/week  4 weeks       Prognosis Prognosis for improved oropharyngeal function: Guarded      Swallow Study   General Date of Onset: 03/28/24 HPI: Dustin Hess is a 78 y.o. male with history of CAD status post PCI in 2005, hypertension, hypothyroidism, blindness, anemia, BPH was brought to the ER after patient was found to be increasingly weak not eating well lethargic some confusion for the last 2 to 3 days.  History was provided by patient's wife.  Per wife patient did not have any nausea vomiting or diarrhea did not complain of any chest pain or abdominal pain.  Was refusing to eat because of poor appetite and became more weak and tired and lethargic. Type of Study: Bedside Swallow Evaluation Previous Swallow Assessment: none per chart Diet Prior to this Study: Regular;Thin liquids (Level 0) Temperature Spikes Noted: No Respiratory Status: Room air History of Recent Intubation: No Behavior/Cognition: Requires cueing;Lethargic/Drowsy;Doesn't follow directions Oral Cavity Assessment: Excessive secretions;Dried secretions Oral Care Completed by SLP:  (unable to complete; pt not opening mouth except for limited PO trials) Vision: Impaired for self-feeding (blind) Self-Feeding Abilities: Total assist Patient Positioning: Upright in bed Baseline Vocal Quality: Hoarse (weak) Volitional Cough: Cognitively unable to elicit Volitional Swallow: Unable to elicit    Oral/Motor/Sensory Function Overall Oral Motor/Sensory Function:  (unable to assess)   Ice Chips Ice chips: Impaired Oral Phase Impairments: Reduced labial seal Oral Phase Functional Implications: Prolonged oral  transit Pharyngeal Phase Impairments: Decreased hyoid-laryngeal movement   Thin Liquid Thin Liquid: Impaired Oral Phase Functional Implications: Prolonged oral transit Pharyngeal  Phase Impairments: Decreased hyoid-laryngeal movement    Nectar Thick Nectar Thick Liquid: Not tested   Honey Thick Honey Thick Liquid: Not tested   Puree Puree: Impaired Presentation: Spoon Oral Phase Impairments: Poor awareness of bolus Oral Phase Functional Implications: Prolonged oral transit;Oral holding Pharyngeal Phase Impairments: Decreased hyoid-laryngeal movement   Solid     Solid: Not tested      [name] 03/29/2024,8:52 AM

## 2024-03-29 NOTE — ED Notes (Signed)
 Report has been called to nurse  on 2w she has accepted the pt

## 2024-03-29 NOTE — Progress Notes (Signed)
 Modified Barium Swallow Study  Patient Details  Name: Dustin Hess MRN: [id number] Date of Birth: 11/19/1945  Today's Date: 03/29/2024  Modified Barium Swallow completed.  Full report located under Chart Review in the Imaging Section.  History of Present Illness Dustin Hess is a 78 y.o. male with history of CAD status post PCI in 2005, hypertension, hypothyroidism, blindness, anemia, BPH was brought to the ER after patient was found to be increasingly weak, not eating well, lethargic with some confusion for the last 2 to 3 days.  History was provided by patient's wife.  Per wife patient did not have any nausea vomiting or diarrhea did not complain of any chest pain or abdominal pain.  Was refusing to eat because of poor appetite and became more weak, tired, and lethargic.   Clinical Impression Pt exhibits moderate oropharyngeal dysphagia. This appears to primarily be a result of posture as his neck remains in a hyperextended position with limited mobility, which greatly affected his ability to try compensatory strategies. Due to the anterior curvature of his cervical spine, base of tongue retraction and epiglottic inversion are nearly absent. This results in retention of the majority of each bolus along the base of tongue, valleculae, and posterior pharyngeal wall. With consecutive sips of thin liquids, the bolus reaches his vocal folds without sensation (PAS 5). When cued to cough, it was expelled. Nectar thick liquids allow slightly improved epiglottic deflection and thus, better airway protection. Even when given via consecutive straw sips, nectar thick liquids are transiently penetrated (PAS 2, considered WFL). While purees do not enter the laryngeal vestibule, there is minimal clearance from the valleculae which is only slightly improved with a liquid wash and subswallows. Overall, there is minimal pharyngeal space for each bolus to flow through the pharynx and PES. Brief oral  holding with tremulous swallow initiation was also observed. Recommend full nectar thick liquid diet with consideration of meds given via alternative means. He will require assistance with feeding as well as cueing to swallow multiple times and use a liquid wash. SLP will f/u.  DIGEST Swallow Severity Rating*  Safety:  1  Efficiency: 3  Overall Pharyngeal Swallow Severity: 2 (moderate) 1: mild; 2: moderate; 3: severe; 4: profound  *The Dynamic Imaging Grade of Swallowing Toxicity is standardized for the head and neck cancer population, however, demonstrates promising clinical applications across populations to standardize the clinical rating of pharyngeal swallow safety and severity.    Factors that may increase risk of adverse event in presence of aspiration [name] & [name] 2021): Poor general health and/or compromised immunity;Reduced cognitive function;Limited mobility;Frail or deconditioned;Dependence for feeding and/or oral hygiene  Swallow Evaluation Recommendations Recommendations: PO diet PO Diet Recommendation: Mildly thick liquids (Level 2, nectar thick);Full liquid diet Liquid Administration via: Spoon;Cup;Straw Medication Administration: Via alternative means Supervision: Full assist for feeding;Full supervision/cueing for swallowing strategies Swallowing strategies  : Minimize environmental distractions;Slow rate;Small bites/sips;Multiple dry swallows after each bite/sip;Follow solids with liquids Postural changes: Position pt fully upright for meals;Stay upright 30-60 min after meals Oral care recommendations: Oral care BID (2x/day) Caregiver Recommendations: Avoid jello, ice cream, thin soups, popsicles;Remove water pitcher    Damien Blumenthal, M.A., CCC-SLP Speech Language Pathology, Acute Rehabilitation Services  Secure Chat preferred [phone]  03/29/2024,4:35 PM

## 2024-03-30 ENCOUNTER — Inpatient Hospital Stay (HOSPITAL_COMMUNITY)

## 2024-03-30 DIAGNOSIS — N179 Acute kidney failure, unspecified: Secondary | ICD-10-CM | POA: Diagnosis not present

## 2024-03-30 LAB — CBC WITH DIFFERENTIAL/PLATELET
Abs Immature Granulocytes: 0.01 10*3/uL (ref 0.00–0.07)
Basophils Absolute: 0 10*3/uL (ref 0.0–0.1)
Basophils Relative: 0 %
Eosinophils Absolute: 0 10*3/uL (ref 0.0–0.5)
Eosinophils Relative: 0 %
HCT: 44.1 % (ref 39.0–52.0)
Hemoglobin: 14.3 g/dL (ref 13.0–17.0)
Immature Granulocytes: 0 %
Lymphocytes Relative: 7 %
Lymphs Abs: 0.4 10*3/uL — ABNORMAL LOW (ref 0.7–4.0)
MCH: 32 pg (ref 26.0–34.0)
MCHC: 32.4 g/dL (ref 30.0–36.0)
MCV: 98.7 fL (ref 80.0–100.0)
Monocytes Absolute: 0.6 10*3/uL (ref 0.1–1.0)
Monocytes Relative: 10 %
Neutro Abs: 4.6 10*3/uL (ref 1.7–7.7)
Neutrophils Relative %: 83 %
Platelets: 81 10*3/uL — ABNORMAL LOW (ref 150–400)
RBC: 4.47 MIL/uL (ref 4.22–5.81)
RDW: 11.9 % (ref 11.5–15.5)
WBC: 5.6 10*3/uL (ref 4.0–10.5)
nRBC: 0 % (ref 0.0–0.2)

## 2024-03-30 LAB — COMPREHENSIVE METABOLIC PANEL WITH GFR
ALT: 17 U/L (ref 0–44)
AST: 27 U/L (ref 15–41)
Albumin: 3.5 g/dL (ref 3.5–5.0)
Alkaline Phosphatase: 33 U/L — ABNORMAL LOW (ref 38–126)
Anion gap: 6 (ref 5–15)
BUN: 27 mg/dL — ABNORMAL HIGH (ref 8–23)
CO2: 26 mmol/L (ref 22–32)
Calcium: 9.2 mg/dL (ref 8.9–10.3)
Chloride: 113 mmol/L — ABNORMAL HIGH (ref 98–111)
Creatinine, Ser: 0.89 mg/dL (ref 0.61–1.24)
GFR, Estimated: >60 mL/min (ref >60)
Glucose, Bld: 101 mg/dL — ABNORMAL HIGH (ref 70–99)
Potassium: 3.3 mmol/L — ABNORMAL LOW (ref 3.5–5.1)
Sodium: 145 mmol/L (ref 135–145)
Total Bilirubin: 1.7 mg/dL — ABNORMAL HIGH (ref 0.0–1.2)
Total Protein: 6.7 g/dL (ref 6.5–8.1)

## 2024-03-30 LAB — BASIC METABOLIC PANEL WITH GFR
Anion gap: 8 (ref 5–15)
Anion gap: 9 (ref 5–15)
Anion gap: 6 (ref 5–15)
BUN: 27 mg/dL — ABNORMAL HIGH (ref 8–23)
BUN: 23 mg/dL (ref 8–23)
BUN: 22 mg/dL (ref 8–23)
CO2: 25 mmol/L (ref 22–32)
CO2: 26 mmol/L (ref 22–32)
CO2: 26 mmol/L (ref 22–32)
Calcium: 9.2 mg/dL (ref 8.9–10.3)
Calcium: 9.1 mg/dL (ref 8.9–10.3)
Calcium: 9 mg/dL (ref 8.9–10.3)
Chloride: 114 mmol/L — ABNORMAL HIGH (ref 98–111)
Chloride: 111 mmol/L (ref 98–111)
Chloride: 111 mmol/L (ref 98–111)
Creatinine, Ser: 0.88 mg/dL (ref 0.61–1.24)
Creatinine, Ser: 0.8 mg/dL (ref 0.61–1.24)
Creatinine, Ser: 0.91 mg/dL (ref 0.61–1.24)
GFR, Estimated: >60 mL/min (ref >60)
GFR, Estimated: >60 mL/min (ref >60)
GFR, Estimated: >60 mL/min (ref >60)
Glucose, Bld: 110 mg/dL — ABNORMAL HIGH (ref 70–99)
Glucose, Bld: 114 mg/dL — ABNORMAL HIGH (ref 70–99)
Glucose, Bld: 123 mg/dL — ABNORMAL HIGH (ref 70–99)
Potassium: 3.4 mmol/L — ABNORMAL LOW (ref 3.5–5.1)
Potassium: 3.6 mmol/L (ref 3.5–5.1)
Potassium: 3.6 mmol/L (ref 3.5–5.1)
Sodium: 147 mmol/L — ABNORMAL HIGH (ref 135–145)
Sodium: 146 mmol/L — ABNORMAL HIGH (ref 135–145)
Sodium: 143 mmol/L (ref 135–145)

## 2024-03-30 LAB — MAGNESIUM: Magnesium: 1.9 mg/dL (ref 1.7–2.4)

## 2024-03-30 LAB — PHOSPHORUS: Phosphorus: 2.5 mg/dL (ref 2.5–4.6)

## 2024-03-30 MED ORDER — DEXTROSE-SODIUM CHLORIDE 5-0.45 % IV SOLN: INTRAVENOUS | Status: AC

## 2024-03-30 MED ORDER — POTASSIUM CHLORIDE 10 MEQ/100ML IV SOLN
10.0000 meq | INTRAVENOUS | Status: AC
  Administered 2024-03-30 (×4): 10 meq via INTRAVENOUS
  Filled 2024-03-30 (×4): qty 100

## 2024-03-30 MED ORDER — THIAMINE MONONITRATE 100 MG PO TABS
100.0000 mg | ORAL_TABLET | Freq: Every day | ORAL | Status: DC
  Administered 2024-03-31 – 2024-04-05 (×6): 100 mg via ORAL
  Filled 2024-03-30 (×6): qty 1

## 2024-03-30 NOTE — Progress Notes (Signed)
 [facility] Progress Note Patient: Dustin Hess FMW:[id number] DOB: 05-15-1946 DOA: 03/28/2024  DOS: the patient was seen and examined on 03/30/2024  Brief Hospital Course: Patient with PMH of CAD, HTN, hypothyroidism, chronic blindness, anemia presented to the hospital with complaints of l being ethargic. Found to have AKI with hypernatremia. Note from ENT just a few days ago on 6/26 suggest that he is doing well.  Assessment and Plan: Acute metabolic encephalopathy. Secondary to AKI as well as hyponatremia. Mentation improving.  Oriented to place and person and able to follow commands. CT head unremarkable for any acute abnormality.  MRI brain ordered. No focal deficit no asterixis on my exam. At baseline per son patient ambulates without any assistance, able to feed himself and mostly independent for his ADLs.  Acute kidney injury. Hypernatremia. Likely from poor p.o. intake although etiology is not clear. CT scan negative for any acute abnormality. Currently on a dysphagia diet.  Will monitor for improvement in oral intake. Initiate protein supplements. Baseline serum creatinine 1.1.  On admission serum creatinine 2.79.  Almost back to baseline now. Continue with IV fluid.  Hyperbilirubinemia Cholelithiasis with CBD ULN-6 to 7 mm No abdominal tenderness on examination. CT scan shows evidence of cholelithiasis. CBD 6 to 7 mm without any evidence of filling defect on ultrasound. No evidence of cholelithiasis. Monitor clinically for now. Daily CMP  Hypothyroidism. TSH was WNL.  1.63. Continuing Synthroid .  HTN. Patient takes Imdur , metoprolol  at home. Currently blood pressure is soft therefore holding this medication. Continue hydralazine  as needed.  Macrocytosis. Relative B12 deficiency. Iron deficiency Chronic thrombocytopenia Hemoglobin is stable.  Between 30 and 13. MCV is more than 90.  Does not have microcytosis. B12 is relatively low.  389 Folic  acid is normal.  28.7 Iron 35. Initiate supplementation. Platelet count have been chronically low in 130s to 120s. Currently platelet count is in 80s. No abnormality on the smear. Monitor.  CAD.  HLD No chest pain.  EKG unremarkable. Continue aspirin .  Hold Lipitor.  BPH. Resuming home medication.  Dysphagia Underwent modified barium's study this admission Currently on D1 diet nectar thick liquid.  Blindness Cerebral atrophy Chronic blindness Cerebral atrophy noted on the CT scan.  No focal deficit.  Monitor. MRI ordered.   Subjective: More alert and oriented.  No nausea no vomiting.  No other acute events overnight.  Discussed with son Curtistine on the phone using the number in the demographics.  At baseline patient is alert awake and oriented and independent for his ADLs.  Physical Exam: General: in Mild distress, No Rash Cardiovascular: S1 and S2 Present, No Murmur Respiratory: Good respiratory effort, Bilateral Air entry present. No Crackles, No wheezes Abdomen: Bowel Sound present, No tenderness Extremities: No edema Neuro: Alert and oriented to self and place, no new focal deficit, able to follow commands  Data Reviewed: I have Reviewed nursing notes, Vitals, and Lab results. Since last encounter, pertinent lab results CBC and BMP   . I have ordered test including CBC and BMP  . I have ordered imaging MRI brain  .   Disposition: Status is: Inpatient Remains inpatient appropriate because: Monitor for improvement in mentation and oral intake  SCDs Start: 03/28/24 2125   Family Communication: Discussed with son Curtistine on the phone as above. Level of care: Telemetry Medical   Vitals:   03/30/24 0452 03/30/24 0747 03/30/24 1225 03/30/24 1627  BP: (!) 158/87 (!) 147/99 (!) 145/90 137/77  Pulse: 88 95 100 89  Resp: 18  16    Temp: 97.9 F (36.6 C) 97.7 F (36.5 C) 98 F (36.7 C) 97.9 F (36.6 C)  TempSrc:   Oral   SpO2: 100% 99% 99% 100%  Weight:      Height:          Author: Yetta Blanch, MD 03/30/2024 5:55 PM  Please look on [email or web address] to find out who is on call.

## 2024-03-30 NOTE — Plan of Care (Signed)

## 2024-03-30 NOTE — Progress Notes (Signed)
 OT Cancellation Note  Patient Details Name: Dustin Hess MRN: [id number] DOB: [birth year]   Cancelled Treatment:    Reason Eval/Treat Not Completed: Patient at procedure or test/ unavailable (at MRI. Will return as schedule allows.)  Laith Antonelli D Walton, OTD, OTR/L [facility] Acute Rehabilitation Office: [phone]   Dustin Hess 03/30/2024, 3:39 PM

## 2024-03-30 NOTE — Progress Notes (Signed)
 Speech Language Pathology Treatment: Dysphagia  Patient Details Name: Dustin Hess MRN: [id number] DOB: Jul 29, 1946 Today's Date: 03/30/2024 Time: 9143-9089 SLP Time Calculation (min) (ACUTE ONLY): 14 min  Assessment / Plan / Recommendation Clinical Impression  Pt seen for follow up SLP session to assess diet tolerance of puree solids and nectar-thick liquids. Pt remains on room air and is afebrile. WBC WNL. PO intake is reported at 100% per chart, however, full liquid diet has limited options so it is unclear of how much pt has actually consumed.   Pt consumed 4oz of applesauce and 4oz of nectar-thick liquids by straw with alternating bites/sips. Oral transit time observed to be slightly more efficient compared to clinical swallow assessment yesterday with pt more accepting of PO trials. Continue to note multiple swallows with each bite/sip, concerning for reduced pharyngeal clearance (which was observed on MBSS). Pt benefited from moderate amount of cueing to initiate additional dry swallows and alternate bites/sips to aid pharyngeal clearance, as this was a helpful strategy on the MBSS. No overt or subtle s/s of aspiration observed across all trials.   Diet advanced to a puree diet with continuing nectar-thick liquids. SLP will continue to follow closely to monitor diet tolerance and modify diet as indicated.    HPI HPI: Dustin Hess is a 78 y.o. male with history of CAD status post PCI in 2005, hypertension, hypothyroidism, blindness, anemia, BPH was brought to the ER after patient was found to be increasingly weak, not eating well, lethargic with some confusion for the last 2 to 3 days.  History was provided by patient's wife.  Per wife patient did not have any nausea vomiting or diarrhea did not complain of any chest pain or abdominal pain.  Was refusing to eat because of poor appetite and became more weak, tired, and lethargic.      SLP Plan  Continue with current plan of  care          Recommendations  Diet recommendations: Dysphagia 1 (puree);Thin liquid Liquids provided via: Straw;Cup Medication Administration: Crushed with puree Supervision: Full supervision/cueing for compensatory strategies Compensations: Slow rate;Small sips/bites;Follow solids with liquid;Multiple dry swallows after each bite/sip Postural Changes and/or Swallow Maneuvers: Seated upright 90 degrees;Upright 30-60 min after meal                  Oral care QID   Frequent or constant Supervision/Assistance Dysphagia, oropharyngeal phase (R13.12)     Continue with current plan of care     [name]  03/30/2024, 9:13 AM

## 2024-03-31 DIAGNOSIS — R5381 Other malaise: Secondary | ICD-10-CM

## 2024-03-31 DIAGNOSIS — N179 Acute kidney failure, unspecified: Secondary | ICD-10-CM | POA: Diagnosis not present

## 2024-03-31 LAB — CBC WITH DIFFERENTIAL/PLATELET
Abs Immature Granulocytes: 0.03 10*3/uL (ref 0.00–0.07)
Basophils Absolute: 0 10*3/uL (ref 0.0–0.1)
Basophils Relative: 0 %
Eosinophils Absolute: 0.1 10*3/uL (ref 0.0–0.5)
Eosinophils Relative: 1 %
HCT: 40.9 % (ref 39.0–52.0)
Hemoglobin: 13.5 g/dL (ref 13.0–17.0)
Immature Granulocytes: 1 %
Lymphocytes Relative: 13 %
Lymphs Abs: 0.6 10*3/uL — ABNORMAL LOW (ref 0.7–4.0)
MCH: 32.2 pg (ref 26.0–34.0)
MCHC: 33 g/dL (ref 30.0–36.0)
MCV: 97.6 fL (ref 80.0–100.0)
Monocytes Absolute: 0.6 10*3/uL (ref 0.1–1.0)
Monocytes Relative: 13 %
Neutro Abs: 3.3 10*3/uL (ref 1.7–7.7)
Neutrophils Relative %: 72 %
Platelets: 74 10*3/uL — ABNORMAL LOW (ref 150–400)
RBC: 4.19 MIL/uL — ABNORMAL LOW (ref 4.22–5.81)
RDW: 11.9 % (ref 11.5–15.5)
WBC: 4.6 10*3/uL (ref 4.0–10.5)
nRBC: 0 % (ref 0.0–0.2)

## 2024-03-31 LAB — COMPREHENSIVE METABOLIC PANEL WITH GFR
ALT: 15 U/L (ref 0–44)
AST: 22 U/L (ref 15–41)
Albumin: 3 g/dL — ABNORMAL LOW (ref 3.5–5.0)
Alkaline Phosphatase: 31 U/L — ABNORMAL LOW (ref 38–126)
Anion gap: 7 (ref 5–15)
BUN: 21 mg/dL (ref 8–23)
CO2: 26 mmol/L (ref 22–32)
Calcium: 8.9 mg/dL (ref 8.9–10.3)
Chloride: 110 mmol/L (ref 98–111)
Creatinine, Ser: 0.84 mg/dL (ref 0.61–1.24)
GFR, Estimated: >60 mL/min (ref >60)
Glucose, Bld: 98 mg/dL (ref 70–99)
Potassium: 3.7 mmol/L (ref 3.5–5.1)
Sodium: 143 mmol/L (ref 135–145)
Total Bilirubin: 1.2 mg/dL (ref 0.0–1.2)
Total Protein: 5.9 g/dL — ABNORMAL LOW (ref 6.5–8.1)

## 2024-03-31 LAB — MAGNESIUM: Magnesium: 1.9 mg/dL (ref 1.7–2.4)

## 2024-03-31 MED ORDER — ACETAMINOPHEN 160 MG/5ML PO SOLN
650.0000 mg | ORAL | Status: DC | PRN
  Administered 2024-03-31 – 2024-04-05 (×3): 650 mg via ORAL
  Filled 2024-03-31 (×3): qty 20.3

## 2024-03-31 NOTE — Progress Notes (Signed)
 Speech Language Pathology Treatment: Dysphagia  Patient Details Name: Dustin Hess MRN: [id number] DOB: Jul 23, 1946 Today's Date: 03/31/2024 Time: 9169-9082 SLP Time Calculation (min) (ACUTE ONLY): 47 min  Assessment / Plan / Recommendation Clinical Impression  Pt seen for follow up diet tolerance session and family education. [name] sister at bedside. SLP reviewed MBSS images, results and recommendations with sister, who verbalized good understanding. Reviewed compensatory swallow strategies to reduce pharyngeal residue that was observed on MBSS. Per sister, pt consumed 75% of his dinner tray previous evening with no overt or subtle s/s of aspiration observed. He remains on RA and afebrile. He is also more alert and interactive compared to previous sessions. SLP demonstrated compensatory swallow strategies of alternating bites sips with purees and nectar-thick liquids, slow feeding pace, and cueing pt to swallow x2-3 with each bite/sip in effort to aid pharyngeal clearance. Pt consumed 4 oz of yogurt, 8 oz of nectar-thick liquids (straw), and meds crushed in applesauce + 1 whole med (unable to crush) in applesauce with no overt or subtle s/s of aspiration observed.   SLP will continue to follow to monitor diet tolerance and trial thin liquids and possible mech altered trials. May consider repeat MBSS if clinically indicated since pt's alertness has improved; however, he still remains quite weak. He is unable to feed himself and can feed himself at baseline per sister. SLP will follow.    HPI HPI: Dustin Hess is a 78 y.o. male with history of CAD status post PCI in 2005, hypertension, hypothyroidism, blindness, anemia, BPH was brought to the ER after patient was found to be increasingly weak, not eating well, lethargic with some confusion for the last 2 to 3 days.  History was provided by patient's wife.  Per wife patient did not have any nausea vomiting or diarrhea did not complain of  any chest pain or abdominal pain.  Was refusing to eat because of poor appetite and became more weak, tired, and lethargic.      SLP Plan  Continue with current plan of care          Recommendations  Diet recommendations: Dysphagia 1 (puree);Nectar-thick liquid Liquids provided via: Straw;Cup Medication Administration: Crushed with puree Supervision: Full supervision/cueing for compensatory strategies Compensations: Slow rate;Small sips/bites;Follow solids with liquid;Multiple dry swallows after each bite/sip Postural Changes and/or Swallow Maneuvers: Seated upright 90 degrees;Upright 30-60 min after meal                  Oral care BID   Frequent or constant Supervision/Assistance Dysphagia, oropharyngeal phase (R13.12)     Continue with current plan of care     Dustin Hess Dustin Hess  03/31/2024, 9:44 AM

## 2024-03-31 NOTE — Evaluation (Signed)
 Occupational Therapy Evaluation Patient Details Name: Dustin Hess MRN: [id number] DOB: March 12, 1946 Today's Date: 03/31/2024   History of Present Illness   78 y.o. male was brought to the ER after patient was found to be increasingly weak, not eating well, lethargic with some confusion for the last 2 to 3 days, dx with acute metabolic encephalopathy. PMH history of CAD status post PCI in 2005, hypertension, hypothyroidism, blindness, anemia, BPH .     Clinical Impressions Pt resting in bed, states has no pain, poor historian. Pt reports he lives with daughter who is available 24/7, did not mention his wife, 1 story house with 2 STE. Pt reports he uses RW to get around and they he does not use a w/c, unsure of accuracy. Pt currently total A for ADLs at bed level, not able to self feed, poor FM skills, not able to follow commands consistently. Pt has fair gross motor movement, max A to assist to EOB with HOB elevated, able to sit EOB unsupported. Pt able to raise bottom off bed but not able to stand fully due to poor foot/hand placement during transfer. At this time recommending postacute rehab <3hrs/day to maximize functional strength to participate in ADLs/mobility, will continue to see acutely to progress as able.      If plan is discharge home, recommend the following:   Two people to help with walking and/or transfers;A lot of help with bathing/dressing/bathroom;Assistance with cooking/housework;Assistance with feeding;Assist for transportation;Help with stairs or ramp for entrance     Functional Status Assessment   Patient has had a recent decline in their functional status and demonstrates the ability to make significant improvements in function in a reasonable and predictable amount of time.     Equipment Recommendations   Other (comment) (defer)     Recommendations for Other Services         Precautions/Restrictions   Precautions Precautions: Fall Recall of  Precautions/Restrictions: Impaired Precaution/Restrictions Comments: B hand mittens, Blind Restrictions Weight Bearing Restrictions Per Provider Order: No     Mobility Bed Mobility Overal bed mobility: Needs Assistance Bed Mobility: Supine to Sit, Sit to Supine     Supine to sit: Max assist Sit to supine: Max assist   General bed mobility comments: max A in/out of bed    Transfers Overall transfer level: Needs assistance Equipment used: Rolling walker (2 wheels) Transfers: Sit to/from Stand Sit to Stand: Max assist, Total assist           General transfer comment: stands almost fully upright but poor foot placement, poor safety awareness, not able to self correct, not able to take steps      Balance Overall balance assessment: Needs assistance Sitting-balance support: No upper extremity supported, Feet supported Sitting balance-Leahy Scale: Fair Sitting balance - Comments: sits on EOB unsupported, no LOB   Standing balance support: Bilateral upper extremity supported, During functional activity, Reliant on assistive device for balance Standing balance-Leahy Scale: Zero Standing balance comment: able to partially stand with RW and therapist support, but not functionally                           ADL either performed or assessed with clinical judgement   ADL Overall ADL's : Needs assistance/impaired Eating/Feeding: Total assistance   Grooming: Total assistance   Upper Body Bathing: Total assistance   Lower Body Bathing: Total assistance   Upper Body Dressing : Total assistance   Lower Body Dressing: Total assistance  Toileting- Clothing Manipulation and Hygiene: Total assistance         General ADL Comments: total A for all ADLs, blind, poor FM skills, not able to follow commands or use hands functionally other than for bed mobility     Vision Baseline Vision/History: 2 Legally blind Ability to See in Adequate Light: 4 Severely  impaired Patient Visual Report: Other (comment) (L eye noted to look swollen/red)       Perception         Praxis         Pertinent Vitals/Pain Pain Assessment Pain Assessment: No/denies pain     Extremity/Trunk Assessment Upper Extremity Assessment Upper Extremity Assessment: Generalized weakness;RUE deficits/detail;LUE deficits/detail RUE Deficits / Details: shoudler stiffness, nota ble to make full fist when asked, could be due to confusion, poor FM skills RUE Coordination: decreased fine motor;decreased gross motor LUE Deficits / Details: shoudler stiffness, nota ble to make full fist when asked, could be due to confusion, poor FM skills LUE Coordination: decreased gross motor;decreased fine motor   Lower Extremity Assessment Lower Extremity Assessment: Defer to PT evaluation       Communication Communication Communication: Impaired Factors Affecting Communication: Reduced clarity of speech   Cognition Arousal: Lethargic Behavior During Therapy: Flat affect Cognition: Cognition impaired   Orientation impairments: Time         OT - Cognition Comments: Pt poor historian, not oriented to date, states he knows he is in hospital, states he doesn't remember falling but reports daughter said he did. Pt requires increased time to follow commands, inconsistent, not aware of deficits.                 Following commands: Impaired Following commands impaired: Follows one step commands inconsistently, Follows one step commands with increased time     Cueing  General Comments   Cueing Techniques: Verbal cues;Gestural cues;Tactile cues      Exercises     Shoulder Instructions      Home Living Family/patient expects to be discharged to:: Private residence Living Arrangements: Spouse/significant other;Children Available Help at Discharge: Family;Available 24 hours/day Type of Home: House Home Access: Stairs to enter [employer] of Steps: 1    Home Layout: One level     Bathroom Shower/Tub: Chief Strategy Officer: Handicapped height     Home Equipment: Cane - single point;Shower seat   Additional Comments: Pt poor historian, but states he lives in 1 story house, 2 STE, daughter present all day, reports has a walker and shower seat only.      Prior Functioning/Environment Prior Level of Function : Needs assist             Mobility Comments: lately needing help to get out of bed ADLs Comments: family sometimes helps    OT Problem List: Decreased strength;Decreased range of motion;Decreased activity tolerance;Impaired balance (sitting and/or standing);Decreased cognition;Decreased safety awareness;Impaired UE functional use   OT Treatment/Interventions: Self-care/ADL training;Therapeutic exercise;Energy conservation;DME and/or AE instruction;Therapeutic activities;Patient/family education;Balance training      OT Goals(Current goals can be found in the care plan section)   Acute Rehab OT Goals Patient Stated Goal: unable to participate in setting goals OT Goal Formulation: Patient unable to participate in goal setting Time For Goal Achievement: 04/14/24 Potential to Achieve Goals: Fair   OT Frequency:  Min 2X/week    Co-evaluation              AM-PAC OT 6 Clicks Daily Activity  Outcome Measure Help from another person eating meals?: Total Help from another person taking care of personal grooming?: Total Help from another person toileting, which includes using toliet, bedpan, or urinal?: Total Help from another person bathing (including washing, rinsing, drying)?: Total Help from another person to put on and taking off regular upper body clothing?: Total Help from another person to put on and taking off regular lower body clothing?: Total 6 Click Score: 6   End of Session Equipment Utilized During Treatment: Gait belt;Rolling walker (2 wheels) Nurse Communication: Mobility  status;Other (comment) (assistance with feeding)  Activity Tolerance: Patient limited by lethargy Patient left: in bed;with call bell/phone within reach;with bed alarm set  OT Visit Diagnosis: Unsteadiness on feet (R26.81);Other abnormalities of gait and mobility (R26.89);Muscle weakness (generalized) (M62.81);History of falling (Z91.81);Other symptoms and signs involving cognitive function                Time: 8856-8793 OT Time Calculation (min): 23 min Charges:  OT General Charges $OT Visit: 1 Visit OT Evaluation $OT Eval Moderate Complexity: 1 Mod OT Treatments $Self Care/Home Management : 8-22 mins  [place], OTR/L   Elouise JONELLE Bott 03/31/2024, 1:04 PM

## 2024-03-31 NOTE — Evaluation (Signed)
 Physical Therapy Evaluation Patient Details Name: Dustin Hess MRN: [id number] DOB: [birth year] Today's Date: 03/31/2024  History of Present Illness  78 y.o. male was brought to the ER after patient was found to be increasingly weak, not eating well, lethargic with some confusion for the last 2 to 3 days, dx with acute metabolic encephalopathy. PMH history of CAD status post PCI in 2005, hypertension, hypothyroidism, blindness, anemia, BPH .  Clinical Impression  Patient presents with decreased mobility due to generalized weakness, decreased balance, decreased activity tolerance and decreased cognition.  Previously ambulatory at home without device though with needing more help recently and had some A for showers at times.  Patient today needing mod A to min A of 2 to walk around bed to recliner.  He was aware of situation, place and just stated June 2025 instead of July.  Speaking with family and understanding them well.  Feel he will continue to benefit from skilled PT in the acute setting and may need post-acute inpatient rehab (<3 hours/day) at d/c.       If plan is discharge home, recommend the following: A lot of help with bathing/dressing/bathroom;Supervision due to cognitive status;Assistance with cooking/housework;Assist for transportation;Help with stairs or ramp for entrance;A lot of help with walking and/or transfers   Can travel by private vehicle   No    Equipment Recommendations Other (comment) (TBA at next venue)  Recommendations for Other Services       Functional Status Assessment Patient has had a recent decline in their functional status and demonstrates the ability to make significant improvements in function in a reasonable and predictable amount of time.     Precautions / Restrictions Precautions Precautions: Fall Recall of Precautions/Restrictions: Impaired Precaution/Restrictions Comments: B hand mittens, Blind      Mobility  Bed Mobility Overal bed  mobility: Needs Assistance Bed Mobility: Supine to Sit     Supine to sit: HOB elevated, Used rails, Mod assist     General bed mobility comments: assist to scoot hips to EOB and to lift trunk; with time and multimodal cues bringing legs off EOB    Transfers Overall transfer level: Needs assistance Equipment used: 1 person hand held assist Transfers: Sit to/from Stand Sit to Stand: Mod assist           General transfer comment: assist for lifting to stand with HHA and for anterior weight shift    Ambulation/Gait Ambulation/Gait assistance: Min assist, Mod assist, +2 safety/equipment Gait Distance (Feet): 12 Feet Assistive device: 2 person hand held assist, 1 person hand held assist Gait Pattern/deviations: Step-to pattern, Shuffle, Trunk flexed, Decreased stride length       General Gait Details: around bed to recliner with mod A of 1 to min A of 2 (son helping initially) and cues for foot clearance and posture, using bed rail as well when turning around to sit in chair  Stairs            Wheelchair Mobility     Tilt Bed    Modified Rankin (Stroke Patients Only)       Balance Overall balance assessment: Needs assistance Sitting-balance support: Feet supported Sitting balance-Leahy Scale: Fair     Standing balance support: Single extremity supported, Bilateral upper extremity supported Standing balance-Leahy Scale: Poor Standing balance comment: UE support needed in standing                             Pertinent  Vitals/Pain Pain Assessment Pain Assessment: 0-10 Pain Score: 8  Pain Location: neck Pain Descriptors / Indicators: Aching Pain Intervention(s): Monitored during session    Home Living Family/patient expects to be discharged to:: Private residence Living Arrangements: Spouse/significant other;Children Available Help at Discharge: Family;Available 24 hours/day Type of Home: House Home Access: Stairs to enter   [employer] of Steps: 1   Home Layout: One level Home Equipment: Cane - single point;Shower seat Additional Comments: Pt poor historian, but states he lives in 1 story house, 2 STE, daughter present all day, reports has a walker and shower seat only.    Prior Function Prior Level of Function : Needs assist             Mobility Comments: lately needing help to get out of bed ADLs Comments: family sometimes helps     Extremity/Trunk Assessment   Upper Extremity Assessment Upper Extremity Assessment: Defer to OT evaluation RUE Deficits / Details: shoudler stiffness, nota ble to make full fist when asked, could be due to confusion, poor FM skills RUE Coordination: decreased fine motor;decreased gross motor LUE Deficits / Details: shoudler stiffness, nota ble to make full fist when asked, could be due to confusion, poor FM skills LUE Coordination: decreased gross motor;decreased fine motor    Lower Extremity Assessment Lower Extremity Assessment: LLE deficits/detail;RLE deficits/detail RLE Deficits / Details: AAROM grossly WFL, strength hip flexion 2/5, knee extension 3+/5 RLE Coordination: decreased gross motor LLE Deficits / Details: AAROM grossly WFL, strength hip flexion 3-/5, knee extension 4-/5 LLE Coordination: decreased gross motor    Cervical / Trunk Assessment Cervical / Trunk Assessment: Kyphotic  Communication   Communication Communication: Impaired Factors Affecting Communication: Reduced clarity of speech    Cognition Arousal: Alert Behavior During Therapy: WFL for tasks assessed/performed   PT - Cognitive impairments: Memory, Initiation, Problem solving, Safety/Judgement, Orientation   Orientation impairments: Time                   PT - Cognition Comments: stated June 2025, slow to initiate movement with multimodal cues Following commands: Impaired Following commands impaired: Follows one step commands inconsistently, Follows one step commands  with increased time     Cueing Cueing Techniques: Verbal cues, Gestural cues, Tactile cues     General Comments General comments (skin integrity, edema, etc.): family present (arrived during evaluation) and supportive, son from [place]  visiting and wife present as well    Exercises     Assessment/Plan    PT Assessment Patient needs continued PT services  PT Problem List Decreased strength;Decreased cognition;Decreased activity tolerance;Decreased balance;Decreased mobility;Decreased safety awareness       PT Treatment Interventions DME instruction;Gait training;Functional mobility training;Therapeutic activities;Balance training;Therapeutic exercise    PT Goals (Current goals can be found in the Care Plan section)  Acute Rehab PT Goals Patient Stated Goal: to get stronger PT Goal Formulation: With patient/family Time For Goal Achievement: 04/14/24 Potential to Achieve Goals: Fair    Frequency Min 2X/week     Co-evaluation               AM-PAC PT 6 Clicks Mobility  Outcome Measure Help needed turning from your back to your side while in a flat bed without using bedrails?: A Lot Help needed moving from lying on your back to sitting on the side of a flat bed without using bedrails?: A Lot Help needed moving to and from a bed to a chair (including a wheelchair)?: A Lot Help needed standing  up from a chair using your arms (e.g., wheelchair or bedside chair)?: A Lot Help needed to walk in hospital room?: Total Help needed climbing 3-5 steps with a railing? : Total 6 Click Score: 10    End of Session Equipment Utilized During Treatment: Gait belt Activity Tolerance: Patient limited by fatigue Patient left: in chair;with call bell/phone within reach;with family/visitor present   PT Visit Diagnosis: Other abnormalities of gait and mobility (R26.89);Muscle weakness (generalized) (M62.81);Other symptoms and signs involving the nervous system (R29.898)    Time:  1202-1239 PT Time Calculation (min) (ACUTE ONLY): 37 min   Charges:   PT Evaluation $PT Eval Moderate Complexity: 1 Mod PT Treatments $Therapeutic Activity: 8-22 mins PT General Charges $$ ACUTE PT VISIT: 1 Visit         Micheline Portal, PT Acute Rehabilitation Services Office:[phone] 03/31/2024   [name] 03/31/2024, 1:52 PM

## 2024-03-31 NOTE — Progress Notes (Signed)
 Progress Note    Dustin Hess   FMW:[id number]  DOB: [birth year]  DOA: 03/28/2024     2 PCP: [name], MD  Initial CC: [facility] Course: Dustin Hess is a 78 yo male with PMH chronic full bilateral blindness, CAD, HTN, hypothyroidism, anemia presented to the hospital with complaints of being lethargic. Found to have AKI with hypernatremia. He was also recently seen at ENT office on 03/25/2024 without notes of complaints at that time.  Assessment and Plan:  Acute metabolic encephalopathy -resolved Secondary to AKI as well as hypernatremia Mentation improved.  Follows commands easily now CT head unremarkable for any acute abnormality - MRI brain also unremarkable, notably no stroke At baseline per son patient ambulates without any assistance, able to feed himself and mostly independent for his ADLs  AKI Hypernatremia Likely from poor p.o. intake although etiology is not clear CT scan negative for any acute abnormality - SLP following, advance diet as able -Creatinine 2.79 on admission which rapidly improved with fluids -Sodium levels also have normalized  Hyperbilirubinemia Cholelithiasis with CBD ULN-6 to 7 mm No abdominal tenderness on examination CT scan shows evidence of cholelithiasis CBD 6 to 7 mm without any evidence of filling defect on ultrasound Daily CMP  Hypothyroidism TSH was WNL.  1.63 Continuing Synthroid   HTN Patient takes Imdur , metoprolol  at home Currently blood pressure is soft therefore holding this medication Continue hydralazine  as needed  Macrocytosis Relative B12 deficiency Iron deficiency Chronic thrombocytopenia Hemoglobin is stable.  Between 12 and 13 MCV is more than 90.  Does not have microcytosis. B12 is relatively low.  389 Folic acid is normal.  28.7 Iron 35 Initiate supplementation Platelet count have been chronically low in 130s to 120s Currently platelet count is in 80s No abnormality on the  smear Monitor  CAD HLD No chest pain.  EKG unremarkable Continue aspirin  Hold Lipitor  BPH Resuming home medication  Dysphagia Underwent modified barium's study this admission -Continue dysphagia diet, advancement per SLP  Chronic bilateral complete blindness Cerebral atrophy Cerebral atrophy noted on the CT scan.  MRI brain also unremarkable. No focal deficit  Interval History:  No events overnight.  Resting comfortably in bed when seen today.  PT/OT recommending SNF.   Old records reviewed in assessment of this patient  Antimicrobials:   DVT prophylaxis:  SCDs Start: 03/28/24 2125   Code Status:   Code Status: Full Code  Mobility Assessment (Last 72 Hours)     Mobility Assessment     Row Name 03/31/24 1350 03/31/24 1207 03/31/24 0830 03/31/24 0535 03/31/24 0408   Does patient have an order for bedrest or is patient medically unstable -- -- No - Continue assessment No - Continue assessment No - Continue assessment   What is the highest level of mobility based on the progressive mobility assessment? Level 4 (Walks with assist in room) - Balance while marching in place and cannot step forward and back - Complete Level 2 (Chairfast) - Balance while sitting on edge of bed and cannot stand Level 1 (Bedfast) - Unable to balance while sitting on edge of bed Level 1 (Bedfast) - Unable to balance while sitting on edge of bed Level 1 (Bedfast) - Unable to balance while sitting on edge of bed   Is the above level different from baseline mobility prior to current illness? -- -- Yes - Recommend PT order Yes - Recommend PT order Yes - Recommend PT order    Row Name 03/31/24 0134  03/31/24 0000 03/30/24 2200 03/30/24 2008 03/30/24 0920   Does patient have an order for bedrest or is patient medically unstable No - Continue assessment No - Continue assessment No - Continue assessment No - Continue assessment No - Continue assessment   What is the highest level of mobility based on the  progressive mobility assessment? Level 1 (Bedfast) - Unable to balance while sitting on edge of bed Level 1 (Bedfast) - Unable to balance while sitting on edge of bed Level 1 (Bedfast) - Unable to balance while sitting on edge of bed Level 1 (Bedfast) - Unable to balance while sitting on edge of bed Level 1 (Bedfast) - Unable to balance while sitting on edge of bed   Is the above level different from baseline mobility prior to current illness? Yes - Recommend PT order Yes - Recommend PT order Yes - Recommend PT order Yes - Recommend PT order Yes - Recommend PT order    Row Name 03/29/24 1915 03/29/24 0800 03/29/24 0505 03/29/24 0052     Does patient have an order for bedrest or is patient medically unstable No - Continue assessment No - Continue assessment -- No - Continue assessment    What is the highest level of mobility based on the progressive mobility assessment? Level 1 (Bedfast) - Unable to balance while sitting on edge of bed Level 1 (Bedfast) - Unable to balance while sitting on edge of bed Level 1 (Bedfast) - Unable to balance while sitting on edge of bed Level 1 (Bedfast) - Unable to balance while sitting on edge of bed       Barriers to discharge: None Disposition Plan: SNF HH orders placed: N/A Status is: Inpatient  Objective: Blood pressure (!) 141/61, pulse 87, temperature 98.3 F (36.8 C), resp. rate 16, height 5' 3 (1.6 m), weight 64.4 kg, SpO2 93%.  Examination:  Physical Exam Constitutional:      General: He is not in acute distress.    Appearance: Normal appearance.  HENT:     Head: Normocephalic and atraumatic.     Mouth/Throat:     Mouth: Mucous membranes are moist.  Eyes:     Comments: Completely whited out eyes, no pupils noted  Cardiovascular:     Rate and Rhythm: Normal rate and regular rhythm.  Pulmonary:     Effort: Pulmonary effort is normal. No respiratory distress.     Breath sounds: Normal breath sounds. No wheezing.  Abdominal:     General: Bowel  sounds are normal. There is no distension.     Palpations: Abdomen is soft.     Tenderness: There is no abdominal tenderness.  Musculoskeletal:        General: Normal range of motion.     Cervical back: Normal range of motion and neck supple.  Skin:    General: Skin is warm and dry.  Neurological:     General: No focal deficit present.     Mental Status: He is alert. Mental status is at baseline.     Comments: Follows commands easily and moves all 4 extremities  Psychiatric:        Mood and Affect: Mood normal.        Behavior: Behavior normal.      Consultants:    Procedures:    Data Reviewed: Results for orders placed or performed during the hospital encounter of 03/28/24 (from the past 24 hours)  Basic metabolic panel     Status: Abnormal   Collection Time: 03/30/24  7:51 PM  Result Value Ref Range   Sodium 143 135 - 145 mmol/L   Potassium 3.6 3.5 - 5.1 mmol/L   Chloride 111 98 - 111 mmol/L   CO2 26 22 - 32 mmol/L   Glucose, Bld 123 (H) 70 - 99 mg/dL   BUN 22 8 - 23 mg/dL   Creatinine, Ser 9.08 0.61 - 1.24 mg/dL   Calcium  9.0 8.9 - 10.3 mg/dL   GFR, Estimated >39 >39 mL/min   Anion gap 6 5 - 15  CBC with Differential/Platelet     Status: Abnormal   Collection Time: 03/31/24  7:40 AM  Result Value Ref Range   WBC 4.6 4.0 - 10.5 K/uL   RBC 4.19 (L) 4.22 - 5.81 MIL/uL   Hemoglobin 13.5 13.0 - 17.0 g/dL   HCT 59.0 60.9 - 47.9 %   MCV 97.6 80.0 - 100.0 fL   MCH 32.2 26.0 - 34.0 pg   MCHC 33.0 30.0 - 36.0 g/dL   RDW 88.0 88.4 - 84.4 %   Platelets 74 (L) 150 - 400 K/uL   nRBC 0.0 0.0 - 0.2 %   Neutrophils Relative % 72 %   Neutro Abs 3.3 1.7 - 7.7 K/uL   Lymphocytes Relative 13 %   Lymphs Abs 0.6 (L) 0.7 - 4.0 K/uL   Monocytes Relative 13 %   Monocytes Absolute 0.6 0.1 - 1.0 K/uL   Eosinophils Relative 1 %   Eosinophils Absolute 0.1 0.0 - 0.5 K/uL   Basophils Relative 0 %   Basophils Absolute 0.0 0.0 - 0.1 K/uL   Immature Granulocytes 1 %   Abs Immature  Granulocytes 0.03 0.00 - 0.07 K/uL  Comprehensive metabolic panel with GFR     Status: Abnormal   Collection Time: 03/31/24  7:40 AM  Result Value Ref Range   Sodium 143 135 - 145 mmol/L   Potassium 3.7 3.5 - 5.1 mmol/L   Chloride 110 98 - 111 mmol/L   CO2 26 22 - 32 mmol/L   Glucose, Bld 98 70 - 99 mg/dL   BUN 21 8 - 23 mg/dL   Creatinine, Ser 9.15 0.61 - 1.24 mg/dL   Calcium  8.9 8.9 - 10.3 mg/dL   Total Protein 5.9 (L) 6.5 - 8.1 g/dL   Albumin 3.0 (L) 3.5 - 5.0 g/dL   AST 22 15 - 41 U/L   ALT 15 0 - 44 U/L   Alkaline Phosphatase 31 (L) 38 - 126 U/L   Total Bilirubin 1.2 0.0 - 1.2 mg/dL   GFR, Estimated >39 >39 mL/min   Anion gap 7 5 - 15  Magnesium     Status: None   Collection Time: 03/31/24  7:40 AM  Result Value Ref Range   Magnesium 1.9 1.7 - 2.4 mg/dL    I have reviewed pertinent nursing notes, vitals, labs, and images as necessary. I have ordered labwork to follow up on as indicated.  I have reviewed the last notes from staff over past 24 hours. I have discussed patient's care plan and test results with nursing staff, CM/SW, and other staff as appropriate.  Time spent: Greater than 50% of the 55 minute visit was spent in counseling/coordination of care for the patient as laid out in the A&P.   LOS: 2 days   Alm Apo, MD [facility] 03/31/2024, 4:04 PM

## 2024-04-01 DIAGNOSIS — R5381 Other malaise: Secondary | ICD-10-CM | POA: Diagnosis not present

## 2024-04-01 DIAGNOSIS — N179 Acute kidney failure, unspecified: Secondary | ICD-10-CM | POA: Diagnosis not present

## 2024-04-01 MED ORDER — ENSURE PLUS HIGH PROTEIN PO LIQD
237.0000 mL | Freq: Two times a day (BID) | ORAL | Status: DC
  Administered 2024-04-01 – 2024-04-05 (×9): 237 mL via ORAL

## 2024-04-01 NOTE — Care Management Important Message (Signed)
 Important Message  Patient Details  Name: Dustin Hess MRN: [id number] Date of Birth: [birth year]   Important Message Given:  Yes - Medicare IM     [name] 04/01/2024, 2:06 PM

## 2024-04-01 NOTE — Progress Notes (Signed)
 Progress Note    Dustin Hess   FMW:[id number]  DOB: Sep 18, 1946  DOA: 03/28/2024     3 PCP: Dustin Leni Edyth DELENA, MD  Initial CC: [facility] Course: [name] is a 78 yo male with PMH chronic full bilateral blindness, CAD, HTN, hypothyroidism, anemia presented to the hospital with complaints of being lethargic. Found to have AKI with hypernatremia. He was also recently seen at ENT office on 03/25/2024 without notes of complaints at that time.  Assessment and Plan:  Acute metabolic encephalopathy -resolved Secondary to AKI as well as hypernatremia Mentation improved.  Follows commands easily now CT head unremarkable for any acute abnormality - MRI brain also unremarkable, notably no stroke At baseline per son patient ambulates without any assistance, able to feed himself and mostly independent for his ADLs  AKI Hypernatremia Likely from poor p.o. intake although etiology is not clear CT scan negative for any acute abnormality - SLP following, advance diet as able -Creatinine 2.79 on admission which rapidly improved with fluids -Sodium levels also have normalized  Hyperbilirubinemia Cholelithiasis with CBD ULN-6 to 7 mm No abdominal tenderness on examination CT scan shows evidence of cholelithiasis CBD 6 to 7 mm without any evidence of filling defect on ultrasound  Hypothyroidism TSH was normal, 1.63 Continue Synthroid   HTN Patient takes Imdur , metoprolol  at home Currently blood pressure is soft therefore holding this medication Continue hydralazine  as needed  Macrocytosis Relative B12 deficiency Iron deficiency Chronic thrombocytopenia Hemoglobin is stable.  Between 12 and 13 MCV is more than 90.  Does not have microcytosis. B12 is relatively low.  389 Folic acid is normal.  28.7 Iron 35 Initiate supplementation Platelet count have been chronically low in 130s to 120s Currently platelet count is in 80s No abnormality on the  smear  CAD HLD No chest pain.  EKG unremarkable Continue aspirin  Hold Lipitor  BPH - Continue Flomax  and dutasteride   Dysphagia Underwent modified barium's study this admission -Continue dysphagia diet, advancement per SLP  Chronic bilateral complete blindness Cerebral atrophy Cerebral atrophy noted on the CT scan.  MRI brain also unremarkable. No focal deficit  Interval History:  No events overnight.  Resting comfortably in bed when seen today.  PT/OT recommending SNF. Stable for SNF.   Old records reviewed in assessment of this patient  Antimicrobials:   DVT prophylaxis:  SCDs Start: 03/28/24 2125   Code Status:   Code Status: Full Code  Mobility Assessment (Last 72 Hours)     Mobility Assessment     Row Name 04/01/24 1000 04/01/24 0900 04/01/24 0507 04/01/24 0339 04/01/24 0122   Does patient have an order for bedrest or is patient medically unstable No - Continue assessment No - Continue assessment No - Continue assessment No - Continue assessment No - Continue assessment   What is the highest level of mobility based on the progressive mobility assessment? Level 1 (Bedfast) - Unable to balance while sitting on edge of bed Level 1 (Bedfast) - Unable to balance while sitting on edge of bed Level 1 (Bedfast) - Unable to balance while sitting on edge of bed Level 1 (Bedfast) - Unable to balance while sitting on edge of bed Level 1 (Bedfast) - Unable to balance while sitting on edge of bed   Is the above level different from baseline mobility prior to current illness? Yes - Recommend PT order Yes - Recommend PT order Yes - Recommend PT order Yes - Recommend PT order Yes - Recommend PT order  Row Name 03/31/24 2344 03/31/24 2200 03/31/24 2000 03/31/24 1350 03/31/24 1207   Does patient have an order for bedrest or is patient medically unstable No - Continue assessment No - Continue assessment No - Continue assessment -- --   What is the highest level of mobility based on  the progressive mobility assessment? Level 1 (Bedfast) - Unable to balance while sitting on edge of bed Level 1 (Bedfast) - Unable to balance while sitting on edge of bed Level 1 (Bedfast) - Unable to balance while sitting on edge of bed Level 4 (Walks with assist in room) - Balance while marching in place and cannot step forward and back - Complete Level 2 (Chairfast) - Balance while sitting on edge of bed and cannot stand   Is the above level different from baseline mobility prior to current illness? Yes - Recommend PT order Yes - Recommend PT order Yes - Recommend PT order -- --    Row Name 03/31/24 0830 03/31/24 0535 03/31/24 0408 03/31/24 0200 03/31/24 0134   Does patient have an order for bedrest or is patient medically unstable No - Continue assessment No - Continue assessment No - Continue assessment No - Continue assessment No - Continue assessment   What is the highest level of mobility based on the progressive mobility assessment? Level 1 (Bedfast) - Unable to balance while sitting on edge of bed Level 1 (Bedfast) - Unable to balance while sitting on edge of bed Level 1 (Bedfast) - Unable to balance while sitting on edge of bed Level 1 (Bedfast) - Unable to balance while sitting on edge of bed Level 1 (Bedfast) - Unable to balance while sitting on edge of bed   Is the above level different from baseline mobility prior to current illness? Yes - Recommend PT order Yes - Recommend PT order Yes - Recommend PT order Yes - Recommend PT order Yes - Recommend PT order    Row Name 03/31/24 0000 03/30/24 2200 03/30/24 2008 03/30/24 0920 03/29/24 1915   Does patient have an order for bedrest or is patient medically unstable No - Continue assessment No - Continue assessment No - Continue assessment No - Continue assessment No - Continue assessment   What is the highest level of mobility based on the progressive mobility assessment? Level 1 (Bedfast) - Unable to balance while sitting on edge of bed Level 1  (Bedfast) - Unable to balance while sitting on edge of bed Level 1 (Bedfast) - Unable to balance while sitting on edge of bed Level 1 (Bedfast) - Unable to balance while sitting on edge of bed Level 1 (Bedfast) - Unable to balance while sitting on edge of bed   Is the above level different from baseline mobility prior to current illness? Yes - Recommend PT order Yes - Recommend PT order Yes - Recommend PT order Yes - Recommend PT order --      Barriers to discharge: None Disposition Plan: SNF HH orders placed: N/A Status is: Inpatient  Objective: Blood pressure 109/70, pulse (!) 102, temperature 98.2 F (36.8 C), temperature source Oral, resp. rate 17, height 5' 3 (1.6 m), weight 64.4 kg, SpO2 100%.  Examination:  Physical Exam Constitutional:      General: He is not in acute distress.    Appearance: Normal appearance.  HENT:     Head: Normocephalic and atraumatic.     Mouth/Throat:     Mouth: Mucous membranes are moist.  Eyes:     Comments: Completely whited out  eyes, no pupils noted  Cardiovascular:     Rate and Rhythm: Normal rate and regular rhythm.  Pulmonary:     Effort: Pulmonary effort is normal. No respiratory distress.     Breath sounds: Normal breath sounds. No wheezing.  Abdominal:     General: Bowel sounds are normal. There is no distension.     Palpations: Abdomen is soft.     Tenderness: There is no abdominal tenderness.  Musculoskeletal:        General: Normal range of motion.     Cervical back: Normal range of motion and neck supple.  Skin:    General: Skin is warm and dry.  Neurological:     General: No focal deficit present.     Mental Status: He is alert. Mental status is at baseline.     Comments: Follows commands easily and moves all 4 extremities  Psychiatric:        Mood and Affect: Mood normal.        Behavior: Behavior normal.      Consultants:    Procedures:    Data Reviewed: No results found for this or any previous visit (from the  past 24 hours).   I have reviewed pertinent nursing notes, vitals, labs, and images as necessary. I have ordered labwork to follow up on as indicated.  I have reviewed the last notes from staff over past 24 hours. I have discussed patient's care plan and test results with nursing staff, CM/SW, and other staff as appropriate.    LOS: 3 days   Alm Apo, MD [facility] 04/01/2024, 4:11 PM

## 2024-04-01 NOTE — Plan of Care (Signed)

## 2024-04-01 NOTE — [place] FL2 (Signed)
   MEDICAID [id number] LEVEL OF CARE FORM     IDENTIFICATION  Patient Name: Dustin Hess Birthdate: 1946-09-22 Sex: male Admission Date (Current Location): 03/28/2024  [facility] and [place] Number:  Producer, television/film/video and Address:  [facility], 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 11/08/2015   Leukopenia 12/22/2013   Thrombocytopenia, unspecified (HCC) 12/22/2013   Blindness, congenital 04/28/2013   GERD (gastroesophageal reflux disease) 04/28/2013   Benign prostatic hyperplasia 04/28/2013   Hypothyroidism 12/13/2008   HYPERLIPIDEMIA 12/13/2008   Essential hypertension, benign 12/13/2008   Coronary atherosclerosis 12/13/2008    Orientation RESPIRATION BLADDER Height & Weight     Self  Normal External catheter, Incontinent Weight: 141 lb 15.6 oz (64.4 kg) Height:  5' 3 (160 cm)  BEHAVIORAL SYMPTOMS/MOOD NEUROLOGICAL BOWEL NUTRITION STATUS      Continent Diet (see DC summary)  AMBULATORY STATUS COMMUNICATION OF NEEDS Skin   Extensive Assist Verbally Normal                       Personal Care Assistance Level of Assistance  Bathing, Feeding, Dressing Bathing Assistance: Maximum assistance Feeding assistance: Limited assistance Dressing Assistance: Maximum assistance     Functional Limitations Info  Sight, Hearing, Speech  Sight Info: Impaired (blindness, congentital, L & R eye) Hearing Info: Adequate Speech Info: Adequate    SPECIAL CARE FACTORS FREQUENCY  PT (By licensed PT), OT (By licensed OT)     PT Frequency: 5x/week OT Frequency: 5x/week            Contractures Contractures Info: Not present    Additional Factors Info  Allergies, Code Status Code Status Info: FULL Allergies Info: Benadryl (Diphenhydramine Hcl)           Current Medications (04/01/2024):  This is the current hospital active medication list Current Facility-Administered Medications  Medication Dose Route Frequency Provider Last Rate Last Admin   acetaminophen  (TYLENOL ) 160 MG/5ML solution 650 mg  650 mg Oral Q4H PRN Patsy Lenis, MD   650 mg at 03/31/24 1249   aspirin  chewable tablet 81 mg  81 mg Oral Daily Patel, Pranav M, MD   81 mg at 04/01/24 9048   dutasteride  (AVODART ) capsule 0.5 mg  0.5 mg Oral Daily Patel, Pranav M, MD   0.5 mg at 04/01/24 0951   feeding supplement (ENSURE PLUS HIGH PROTEIN) liquid 237 mL  237 mL Oral BID BM Patsy Lenis, MD   237 mL at 04/01/24 0951   hydrALAZINE  (APRESOLINE ) injection 10 mg  10 mg Intravenous Q4H PRN Kakrakandy, Arshad N, MD   10 mg at 03/29/24 2049   levothyroxine  (SYNTHROID ) tablet 88 mcg  88 mcg Oral QAC breakfast Patel, Pranav M, MD   88 mcg at 04/01/24 0440   tamsulosin  (FLOMAX ) capsule 0.8 mg  0.8 mg Oral Daily Patel, Pranav M, MD   0.8 mg at  04/01/24 0951   thiamine (VITAMIN B1) tablet 100 mg  100 mg Oral Daily Tammy Sor, PA-C   100 mg at 04/01/24 9048     Discharge Medications: Please see discharge summary for a list of discharge medications.  Relevant Imaging Results:  Relevant Lab Results:   Additional Information [id number]  Chara Marquard A [place], LCSW

## 2024-04-01 NOTE — TOC Initial Note (Signed)
 Transition of Care [facility]) - Initial/Assessment Note    Patient Details  Name: Dustin Hess MRN: [id number] Date of Birth: Jun 25, 1946  Transition of Care [facility]) CM/SW Contact:    Degan Hanser A [place], LCSW Phone Number: 04/01/2024, 11:43 AM  Clinical Narrative:                 CSW contacted pt's wife, Naomie to complete assessment as pt is oriented x1. She said that they are agreeable for pt to go to rehab short term to SNF because pt has been more difficult to manage at home. She stated that she and their daughter are visually impaired as well so the closest facility possible would be most helpful for placement. SNF process explained, no questions asked. Bed offers pending.    TOC will continue to follow.   Expected Discharge Plan: Skilled Nursing Facility Barriers to Discharge: English as a second language teacher, Continued Medical Work up, SNF Pending bed offer   Patient Goals and CMS Choice            Expected Discharge Plan and Services       Living arrangements for the past 2 months: Single Family Home                                      Prior Living Arrangements/Services Living arrangements for the past 2 months: Single Family Home Lives with:: Adult Children, Spouse          Need for Family Participation in Patient Care: Yes (Comment) Care giver support system in place?: Yes (comment) (pt's spouse)   Criminal Activity/Legal Involvement Pertinent to Current Situation/Hospitalization: No - Comment as needed  Activities of Daily Living   ADL Screening (condition at time of admission) Independently performs ADLs?: No Does the patient have a NEW difficulty with bathing/dressing/toileting/self-feeding that is expected to last >3 days?: No Does the patient have a NEW difficulty with getting in/out of bed, walking, or climbing stairs that is expected to last >3 days?: No Does the patient have a NEW difficulty with communication that is expected to last >3 days?:  No  Permission Sought/Granted                  Emotional Assessment Appearance:: Appears stated age Attitude/Demeanor/Rapport: Unable to Assess Affect (typically observed): Unable to Assess Orientation: : Oriented to Self Alcohol / Substance Use: Not Applicable Psych Involvement: No (comment)  Admission diagnosis:  Dehydration [E86.0] ARF (acute renal failure) (HCC) [N17.9] AKI (acute kidney injury) (HCC) [N17.9] Acute metabolic encephalopathy [G93.41] Patient Active Problem List   Diagnosis Date Noted   Physical deconditioning 03/31/2024   Dyslipidemia 08/01/2019   Educated about COVID-19 virus infection 01/19/2019   CAD (coronary artery disease) of artery bypass graft 11/08/2015   Leukopenia 12/22/2013   Thrombocytopenia, unspecified (HCC) 12/22/2013   Blindness, congenital 04/28/2013   GERD (gastroesophageal reflux disease) 04/28/2013   Benign prostatic hyperplasia 04/28/2013   Hypothyroidism 12/13/2008   HYPERLIPIDEMIA 12/13/2008   Essential hypertension, benign 12/13/2008   Coronary atherosclerosis 12/13/2008   PCP:  [name], MD Pharmacy:   [facility] [phone] [address], [place] - [address] [place] [address] Phone: [phone] Fax: [phone]     Social Drivers of Health (SDOH) Social History: SDOH Screenings   Food Insecurity: No Food Insecurity (03/29/2024)  Housing: Unknown (03/29/2024)  Transportation Needs: No Transportation Needs (03/29/2024)  Utilities: Not At  Risk (03/29/2024)  Social Connections: Socially Isolated (03/29/2024)  Tobacco Use: Low Risk  (03/28/2024)   SDOH Interventions:     Readmission Risk Interventions     No data to display

## 2024-04-02 DIAGNOSIS — R5381 Other malaise: Secondary | ICD-10-CM | POA: Diagnosis not present

## 2024-04-02 LAB — GLUCOSE, CAPILLARY: Glucose-Capillary: 110 mg/dL — ABNORMAL HIGH (ref 70–99)

## 2024-04-02 NOTE — Progress Notes (Signed)
 Progress Note    Dustin Hess   FMW:[id number]  DOB: 1945-12-03  DOA: 03/28/2024     4 PCP: [name], MD  Initial CC: Dustin Hess Course: Dustin Hess is a 78 yo male with PMH chronic full bilateral blindness, CAD, HTN, hypothyroidism, anemia presented to the hospital with complaints of being lethargic. Found to have AKI with hypernatremia. He was also recently seen at ENT office on 03/25/2024 without notes of complaints at that time.  Assessment and Plan:  Physical deconditioning - awaiting SNF placement  Acute metabolic encephalopathy -resolved Secondary to AKI as well as hypernatremia Mentation improved.  Follows commands easily now CT head unremarkable for any acute abnormality - MRI brain also unremarkable, notably no stroke At baseline per son patient ambulates without any assistance, able to feed himself and mostly independent for his ADLs  AKI-resolved Hypernatremia-resolved Likely from poor p.o. intake although etiology is not clear CT scan negative for any acute abnormality - SLP following, advance diet as able -Creatinine 2.79 on admission which rapidly improved with fluids -Sodium levels also have normalized  Hyperbilirubinemia Cholelithiasis with CBD ULN-6 to 7 mm No abdominal tenderness on examination CT scan shows evidence of cholelithiasis CBD 6 to 7 mm without any evidence of filling defect on ultrasound  Hypothyroidism TSH was normal, 1.63 Continue Synthroid   HTN Patient takes Imdur , metoprolol  at home Currently blood pressure is soft therefore holding this medication Continue hydralazine  as needed  Macrocytosis Relative B12 deficiency Iron deficiency Chronic thrombocytopenia Hemoglobin is stable.  Between 12 and 13 MCV is more than 90.  Does not have microcytosis. B12 is relatively low.  389 Folic acid is normal.  28.7 Iron 35 Initiate supplementation Platelet count have been chronically low in 130s to  120s Currently platelet count is in 80s No abnormality on the smear  CAD HLD No chest pain.  EKG unremarkable Continue aspirin  Hold Lipitor  BPH - Continue Flomax  and dutasteride   Dysphagia Underwent modified barium's study this admission -Continue dysphagia diet, advancement per SLP  Chronic bilateral complete blindness Cerebral atrophy Cerebral atrophy noted on the CT scan.  MRI brain also unremarkable. No focal deficit  Interval History:  No events overnight.  Resting comfortably in bed when seen today.  PT/OT recommending SNF. Stable for SNF.   Old records reviewed in assessment of this patient  Antimicrobials:   DVT prophylaxis:  SCDs Start: 03/28/24 2125   Code Status:   Code Status: Full Code  Mobility Assessment (Last 72 Hours)     Mobility Assessment     Row Name 04/02/24 [phone] 04/02/24 0556 04/02/24 0324 04/02/24 0141 04/01/24 2344   Does patient have an order for bedrest or is patient medically unstable No - Continue assessment No - Continue assessment No - Continue assessment No - Continue assessment No - Continue assessment   What is the highest level of mobility based on the progressive mobility assessment? Level 1 (Bedfast) - Unable to balance while sitting on edge of bed Level 1 (Bedfast) - Unable to balance while sitting on edge of bed Level 1 (Bedfast) - Unable to balance while sitting on edge of bed Level 1 (Bedfast) - Unable to balance while sitting on edge of bed Level 1 (Bedfast) - Unable to balance while sitting on edge of bed   Is the above level different from baseline mobility prior to current illness? -- Yes - Recommend PT order Yes - Recommend PT order Yes - Recommend PT order Yes - Recommend  PT order    Row Name 04/01/24 2230 04/01/24 1955 04/01/24 1000 04/01/24 0900 04/01/24 0507   Does patient have an order for bedrest or is patient medically unstable No - Continue assessment No - Continue assessment No - Continue assessment No - Continue  assessment No - Continue assessment   What is the highest level of mobility based on the progressive mobility assessment? Level 1 (Bedfast) - Unable to balance while sitting on edge of bed Level 1 (Bedfast) - Unable to balance while sitting on edge of bed Level 1 (Bedfast) - Unable to balance while sitting on edge of bed Level 1 (Bedfast) - Unable to balance while sitting on edge of bed Level 1 (Bedfast) - Unable to balance while sitting on edge of bed   Is the above level different from baseline mobility prior to current illness? Yes - Recommend PT order Yes - Recommend PT order Yes - Recommend PT order Yes - Recommend PT order Yes - Recommend PT order    Row Name 04/01/24 0339 04/01/24 0122 03/31/24 2344 03/31/24 2200 03/31/24 2000   Does patient have an order for bedrest or is patient medically unstable No - Continue assessment No - Continue assessment No - Continue assessment No - Continue assessment No - Continue assessment   What is the highest level of mobility based on the progressive mobility assessment? Level 1 (Bedfast) - Unable to balance while sitting on edge of bed Level 1 (Bedfast) - Unable to balance while sitting on edge of bed Level 1 (Bedfast) - Unable to balance while sitting on edge of bed Level 1 (Bedfast) - Unable to balance while sitting on edge of bed Level 1 (Bedfast) - Unable to balance while sitting on edge of bed   Is the above level different from baseline mobility prior to current illness? Yes - Recommend PT order Yes - Recommend PT order Yes - Recommend PT order Yes - Recommend PT order Yes - Recommend PT order    Row Name 03/31/24 1350 03/31/24 1207 03/31/24 0830 03/31/24 0535 03/31/24 0408   Does patient have an order for bedrest or is patient medically unstable -- -- No - Continue assessment No - Continue assessment No - Continue assessment   What is the highest level of mobility based on the progressive mobility assessment? Level 4 (Walks with assist in room) - Balance  while marching in place and cannot step forward and back - Complete Level 2 (Chairfast) - Balance while sitting on edge of bed and cannot stand Level 1 (Bedfast) - Unable to balance while sitting on edge of bed Level 1 (Bedfast) - Unable to balance while sitting on edge of bed Level 1 (Bedfast) - Unable to balance while sitting on edge of bed   Is the above level different from baseline mobility prior to current illness? -- -- Yes - Recommend PT order Yes - Recommend PT order Yes - Recommend PT order    Row Name 03/31/24 0200 03/31/24 0134 03/31/24 0000 03/30/24 2200 03/30/24 2008   Does patient have an order for bedrest or is patient medically unstable No - Continue assessment No - Continue assessment No - Continue assessment No - Continue assessment No - Continue assessment   What is the highest level of mobility based on the progressive mobility assessment? Level 1 (Bedfast) - Unable to balance while sitting on edge of bed Level 1 (Bedfast) - Unable to balance while sitting on edge of bed Level 1 (Bedfast) - Unable to balance  while sitting on edge of bed Level 1 (Bedfast) - Unable to balance while sitting on edge of bed Level 1 (Bedfast) - Unable to balance while sitting on edge of bed   Is the above level different from baseline mobility prior to current illness? Yes - Recommend PT order Yes - Recommend PT order Yes - Recommend PT order Yes - Recommend PT order Yes - Recommend PT order      Barriers to discharge: None Disposition Plan: SNF HH orders placed: N/A Status is: Inpatient  Objective: Blood pressure 106/71, pulse 85, temperature 97.6 F (36.4 C), temperature source Oral, resp. rate 16, height 5' 3 (1.6 m), weight 64.4 kg, SpO2 99%.  Examination:  Physical Exam Constitutional:      General: He is not in acute distress.    Appearance: Normal appearance.  HENT:     Head: Normocephalic and atraumatic.     Mouth/Throat:     Mouth: Mucous membranes are moist.  Eyes:     Comments:  Completely whited out eyes, no pupils noted  Cardiovascular:     Rate and Rhythm: Normal rate and regular rhythm.  Pulmonary:     Effort: Pulmonary effort is normal. No respiratory distress.     Breath sounds: Normal breath sounds. No wheezing.  Abdominal:     General: Bowel sounds are normal. There is no distension.     Palpations: Abdomen is soft.     Tenderness: There is no abdominal tenderness.  Musculoskeletal:        General: Normal range of motion.     Cervical back: Normal range of motion and neck supple.  Skin:    General: Skin is warm and dry.  Neurological:     General: No focal deficit present.     Mental Status: He is alert. Mental status is at baseline.     Comments: Follows commands easily and moves all 4 extremities  Psychiatric:        Mood and Affect: Mood normal.        Behavior: Behavior normal.      Consultants:    Procedures:    Data Reviewed: No results found for this or any previous visit (from the past 24 hours).   I have reviewed pertinent nursing notes, vitals, labs, and images as necessary. I have ordered labwork to follow up on as indicated.  I have reviewed the last notes from staff over past 24 hours. I have discussed patient's care plan and test results with nursing staff, CM/SW, and other staff as appropriate.    LOS: 4 days   Alm Apo, MD [facility] 04/02/2024, 2:10 PM

## 2024-04-02 NOTE — TOC Progression Note (Addendum)
 Transition of Care [facility]) - Progression Note    Patient Details  Name: Dustin Hess MRN: [id number] Date of Birth: 12-Jan-1946  Transition of Care [facility]) CM/SW Contact  Isaiah Public, LCSWA Phone Number: 04/02/2024, 3:29 PM  Clinical Narrative:     Due to patients current orientation CSW spoke with patients spouse Dustin Hess and provided SNF bed offers. Dorothy accepted SNF bed offer with [facility]. CSW requested for Jon CMA to start auth for patient. Logan with [facility] confirmed facility can accept patient over the weekend if medically stable for dc. CSW will continue to follow.  [employer] authorization has been started.[employer] [phone] . Insurance currently pending.  Expected Discharge Plan: Skilled Nursing Facility Barriers to Discharge: English as a second language teacher, Continued Medical Work up, SNF Pending bed offer  Expected Discharge Plan and Services       Living arrangements for the past 2 months: Single Family Home                                       Social Determinants of Health (SDOH) Interventions SDOH Screenings   Food Insecurity: No Food Insecurity (03/29/2024)  Housing: Unknown (03/29/2024)  Transportation Needs: No Transportation Needs (03/29/2024)  Utilities: Not At Risk (03/29/2024)  Social Connections: Socially Isolated (03/29/2024)  Tobacco Use: Low Risk  (03/28/2024)    Readmission Risk Interventions     No data to display

## 2024-04-02 NOTE — Progress Notes (Signed)
 Speech Language Pathology Treatment: Dysphagia  Patient Details Name: Dustin Hess MRN: [id number] DOB: 12-06-1945 Today's Date: 04/02/2024 Time: 8964-8951 SLP Time Calculation (min) (ACUTE ONLY): 13 min  Assessment / Plan / Recommendation Clinical Impression  F/u for swallowing. Pt was alert, participatory.  Did well with breakfast per RN.  Provided with bites of puree and trials of thin liquids.  He continues to report sensation of residue in throat (closes up), which is consistent with findings of residue with heavier/thicker food boluses.  Needs cues for double swallows and staff must alternate presentation of solids/liquids, which is cumbersome but helps him manage the residue. There were no obvious s/s of aspiration today, but has some sensory impairment that can muddle clinical presentation.   Will plan to repeat MBS early next week to examine potential to advance liquids.  He has likely been compensating for some component of his dysphagia for some time now. Hopefully as MS has improved his airway protection will return to baseline.      HPI HPI: Dustin Hess is a 78 y.o. male who was brought to the ER after patient was found to be increasingly weak, not eating well, lethargic and confused. Dx acute metabolic encephalopathy, AKI, hypernatremia. PMHxCAD status post PCI in 2005, hypertension, hypothyroidism, blindness, anemia, BPH.      SLP Plan  Continue with current plan of care          Recommendations  Diet recommendations: Dysphagia 1 (puree);Nectar-thick liquid Liquids provided via: Straw;Cup Medication Administration: Crushed with puree Supervision: Full supervision/cueing for compensatory strategies Compensations: Slow rate;Small sips/bites;Follow solids with liquid;Multiple dry swallows after each bite/sip Postural Changes and/or Swallow Maneuvers: Seated upright 90 degrees;Upright 30-60 min after meal                  Oral care BID    Frequent or constant Supervision/Assistance Dysphagia, oropharyngeal phase (R13.12)     Continue with current plan of care    [name] L. Vona, MA CCC/SLP Clinical Specialist - Acute Care SLP Acute Rehabilitation Services Office number [phone]  [name]  04/02/2024, 10:59 AM

## 2024-04-02 NOTE — Progress Notes (Signed)
 Physical Therapy Treatment Patient Details Name: Dustin Hess MRN: [id number] DOB: [birth year] Today's Date: 04/02/2024   History of Present Illness 78 y.o. male was brought to the ER after patient was found to be increasingly weak, not eating well, lethargic with some confusion for the last 2 to 3 days, dx with acute metabolic encephalopathy. PMH history of CAD status post PCI in 2005, hypertension, hypothyroidism, blindness, anemia, BPH .    PT Comments  Progressing today to hallway ambulation though still needing Bilat UE support using rail and HHA.  He reported not using RW previously but with flexed posture and bilat UE support needed may benefit from trialing it next session.  Remains appropriate for inpatient rehab (<3 hours/day) at d/c.     If plan is discharge home, recommend the following: A lot of help with bathing/dressing/bathroom;Supervision due to cognitive status;Assistance with cooking/housework;Assist for transportation;Help with stairs or ramp for entrance;A lot of help with walking and/or transfers   Can travel by private vehicle     No  Equipment Recommendations  Other (comment) (TBA)    Recommendations for Other Services       Precautions / Restrictions Precautions Precautions: Fall Recall of Precautions/Restrictions: Impaired Precaution/Restrictions Comments: Blind     Mobility  Bed Mobility Overal bed mobility: Needs Assistance Bed Mobility: Supine to Sit     Supine to sit: HOB elevated, Used rails, Mod assist     General bed mobility comments: assist to initiate and to scoot hips, pt able to lift trunk some with rail    Transfers Overall transfer level: Needs assistance Equipment used: 1 person hand held assist Transfers: Sit to/from Stand Sit to Stand: Mod assist, Min assist           General transfer comment: some lifting help to stand    Ambulation/Gait Ambulation/Gait assistance: Mod assist, +2 safety/equipment Gait Distance  (Feet): 10 Feet (to bathroom, then 30' in hallway with rail and 1 vs 2 HHA with chair follow) Assistive device: 2 person hand held assist, 1 person hand held assist (and wall rail) Gait Pattern/deviations: Step-to pattern, Shuffle, Trunk flexed, Decreased stride length       General Gait Details: assist for balance pt leaning back some and decreased L stride length, shuffling steps and flexed posture; tech guiding him to rail in hallway then 1 HHA and rail, after toileting in bathroom with chair follow   Stairs             Wheelchair Mobility     Tilt Bed    Modified Rankin (Stroke Patients Only)       Balance Overall balance assessment: Needs assistance   Sitting balance-Leahy Scale: Fair     Standing balance support: Single extremity supported Standing balance-Leahy Scale: Poor Standing balance comment: mod A for balance with HHA                            Communication Communication Communication: Impaired Factors Affecting Communication: Reduced clarity of speech  Cognition Arousal: Alert Behavior During Therapy: WFL for tasks assessed/performed   PT - Cognitive impairments: Initiation, Attention, Safety/Judgement                       PT - Cognition Comments: knew it was July 4, that family will stay in town till Saturday; appropriate for session though slow to respond to commands and needing assist to initiate Following commands: Impaired Following commands impaired:  Only follows one step commands consistently, Follows one step commands with increased time    Cueing    Exercises      General Comments General comments (skin integrity, edema, etc.): bed soiled with urine despite periwick, pt toilted in bathroom wiht A and RN Aware      Pertinent Vitals/Pain Pain Assessment Faces Pain Scale: No hurt    Home Living                          Prior Function            PT Goals (current goals can now be found in the  care plan section) Progress towards PT goals: Progressing toward goals    Frequency    Min 2X/week      PT Plan      Co-evaluation              AM-PAC PT 6 Clicks Mobility   Outcome Measure  Help needed turning from your back to your side while in a flat bed without using bedrails?: A Little Help needed moving from lying on your back to sitting on the side of a flat bed without using bedrails?: A Lot Help needed moving to and from a bed to a chair (including a wheelchair)?: A Lot Help needed standing up from a chair using your arms (e.g., wheelchair or bedside chair)?: A Lot Help needed to walk in hospital room?: A Lot Help needed climbing 3-5 steps with a railing? : Total 6 Click Score: 12    End of Session Equipment Utilized During Treatment: Gait belt Activity Tolerance: Patient limited by fatigue Patient left: in chair;with call bell/phone within reach Nurse Communication: Mobility status PT Visit Diagnosis: Other abnormalities of gait and mobility (R26.89);Muscle weakness (generalized) (M62.81);Other symptoms and signs involving the nervous system (R29.898)     Time: 8864-8797 PT Time Calculation (min) (ACUTE ONLY): 27 min  Charges:    $Gait Training: 8-22 mins $Therapeutic Activity: 8-22 mins PT General Charges $$ ACUTE PT VISIT: 1 Visit                     Micheline Portal, PT Acute Rehabilitation Services [phone] 04/02/2024    [name] 04/02/2024, 2:45 PM

## 2024-04-02 NOTE — Progress Notes (Signed)
 A full bath was given to the patient.

## 2024-04-03 DIAGNOSIS — N179 Acute kidney failure, unspecified: Secondary | ICD-10-CM | POA: Diagnosis not present

## 2024-04-03 DIAGNOSIS — R5381 Other malaise: Secondary | ICD-10-CM | POA: Diagnosis not present

## 2024-04-03 NOTE — Plan of Care (Signed)

## 2024-04-03 NOTE — Progress Notes (Signed)
 Progress Note    Dustin Hess   FMW:[id number]  DOB: 09/29/1946  DOA: 03/28/2024     5 PCP: [name], MD  Initial CC: [facility] Course: Mr. Merrick is a 78 yo male with PMH chronic full bilateral blindness, CAD, HTN, hypothyroidism, anemia presented to the hospital with complaints of being lethargic. Found to have AKI with hypernatremia. He was also recently seen at ENT office on 03/25/2024 without notes of complaints at that time.  Assessment and Plan:  Physical deconditioning - awaiting SNF placement  Acute metabolic encephalopathy -resolved Secondary to AKI as well as hypernatremia Mentation improved.  Follows commands easily now CT head unremarkable for any acute abnormality - MRI brain also unremarkable, notably no stroke At baseline per son patient ambulates without any assistance, able to feed himself and mostly independent for his ADLs  AKI-resolved Hypernatremia-resolved Likely from poor p.o. intake although etiology is not clear CT scan negative for any acute abnormality - SLP following, advance diet as able -Creatinine 2.79 on admission which rapidly improved with fluids -Sodium levels also have normalized  Hyperbilirubinemia Cholelithiasis with CBD ULN-6 to 7 mm No abdominal tenderness on examination CT scan shows evidence of cholelithiasis CBD 6 to 7 mm without any evidence of filling defect on ultrasound  Hypothyroidism TSH was normal, 1.63 Continue Synthroid   HTN Patient takes Imdur , metoprolol  at home Currently blood pressure is soft therefore holding this medication Continue hydralazine  as needed  Macrocytosis Relative B12 deficiency Iron deficiency Chronic thrombocytopenia Hemoglobin is stable.  Between 12 and 13 MCV is more than 90.  Does not have microcytosis. B12 is relatively low.  389 Folic acid is normal.  28.7 Iron 35 Initiate supplementation Platelet count have been chronically low in 130s to  120s Currently platelet count is in 80s No abnormality on the smear  CAD HLD No chest pain.  EKG unremarkable Continue aspirin  On lipitor at home   BPH - Continue Flomax  and dutasteride   Dysphagia Underwent modified barium's study this admission -Continue dysphagia diet, advancement per SLP  Chronic bilateral complete blindness Cerebral atrophy Cerebral atrophy noted on the CT scan.  MRI brain also unremarkable. No focal deficit  Interval History:  No events overnight.  Resting comfortably in bed when seen today.  PT/OT recommending SNF. Stable for SNF. Sister present bedside this afternoon helping him eat lunch.   Old records reviewed in assessment of this patient  Antimicrobials:   DVT prophylaxis:  SCDs Start: 03/28/24 2125   Code Status:   Code Status: Full Code  Mobility Assessment (Last 72 Hours)     Mobility Assessment     Row Name 04/03/24 1300 04/03/24 0900 04/02/24 2000 04/02/24 1443 04/02/24 0811   Does patient have an order for bedrest or is patient medically unstable -- No - Continue assessment No - Continue assessment -- No - Continue assessment   What is the highest level of mobility based on the progressive mobility assessment? Level 3 (Stands with assist) - Balance while standing  and cannot march in place Level 4 (Walks with assist in room) - Balance while marching in place and cannot step forward and back - Complete Level 4 (Walks with assist in room) - Balance while marching in place and cannot step forward and back - Complete Level 4 (Walks with assist in room) - Balance while marching in place and cannot step forward and back - Complete Level 1 (Bedfast) - Unable to balance while sitting on edge of bed  Is the above level different from baseline mobility prior to current illness? -- Yes - Recommend PT order Yes - Recommend PT order -- --    Row Name 04/02/24 0556 04/02/24 0324 04/02/24 0141 04/01/24 2344 04/01/24 2230   Does patient have an order  for bedrest or is patient medically unstable No - Continue assessment No - Continue assessment No - Continue assessment No - Continue assessment No - Continue assessment   What is the highest level of mobility based on the progressive mobility assessment? Level 1 (Bedfast) - Unable to balance while sitting on edge of bed Level 1 (Bedfast) - Unable to balance while sitting on edge of bed Level 1 (Bedfast) - Unable to balance while sitting on edge of bed Level 1 (Bedfast) - Unable to balance while sitting on edge of bed Level 1 (Bedfast) - Unable to balance while sitting on edge of bed   Is the above level different from baseline mobility prior to current illness? Yes - Recommend PT order Yes - Recommend PT order Yes - Recommend PT order Yes - Recommend PT order Yes - Recommend PT order    Row Name 04/01/24 1955 04/01/24 1000 04/01/24 0900 04/01/24 0507 04/01/24 0339   Does patient have an order for bedrest or is patient medically unstable No - Continue assessment No - Continue assessment No - Continue assessment No - Continue assessment No - Continue assessment   What is the highest level of mobility based on the progressive mobility assessment? Level 1 (Bedfast) - Unable to balance while sitting on edge of bed Level 1 (Bedfast) - Unable to balance while sitting on edge of bed Level 1 (Bedfast) - Unable to balance while sitting on edge of bed Level 1 (Bedfast) - Unable to balance while sitting on edge of bed Level 1 (Bedfast) - Unable to balance while sitting on edge of bed   Is the above level different from baseline mobility prior to current illness? Yes - Recommend PT order Yes - Recommend PT order Yes - Recommend PT order Yes - Recommend PT order Yes - Recommend PT order    Row Name 04/01/24 0122 03/31/24 2344 03/31/24 2200 03/31/24 2000     Does patient have an order for bedrest or is patient medically unstable No - Continue assessment No - Continue assessment No - Continue assessment No - Continue  assessment    What is the highest level of mobility based on the progressive mobility assessment? Level 1 (Bedfast) - Unable to balance while sitting on edge of bed Level 1 (Bedfast) - Unable to balance while sitting on edge of bed Level 1 (Bedfast) - Unable to balance while sitting on edge of bed Level 1 (Bedfast) - Unable to balance while sitting on edge of bed    Is the above level different from baseline mobility prior to current illness? Yes - Recommend PT order Yes - Recommend PT order Yes - Recommend PT order Yes - Recommend PT order       Barriers to discharge: None Disposition Plan: SNF HH orders placed: N/A Status is: Inpatient  Objective: Blood pressure 137/80, pulse 89, temperature 97.6 F (36.4 C), resp. rate 18, height 5' 3 (1.6 m), weight 64.4 kg, SpO2 100%.  Examination:  Physical Exam Constitutional:      General: He is not in acute distress.    Appearance: Normal appearance.  HENT:     Head: Normocephalic and atraumatic.     Mouth/Throat:     Mouth:  Mucous membranes are moist.  Eyes:     Comments: Completely whited out eyes, no pupils noted  Cardiovascular:     Rate and Rhythm: Normal rate and regular rhythm.  Pulmonary:     Effort: Pulmonary effort is normal. No respiratory distress.     Breath sounds: Normal breath sounds. No wheezing.  Abdominal:     General: Bowel sounds are normal. There is no distension.     Palpations: Abdomen is soft.     Tenderness: There is no abdominal tenderness.  Musculoskeletal:        General: Normal range of motion.     Cervical back: Normal range of motion and neck supple.  Skin:    General: Skin is warm and dry.  Neurological:     General: No focal deficit present.     Mental Status: He is alert. Mental status is at baseline.     Comments: Follows commands easily and moves all 4 extremities  Psychiatric:        Mood and Affect: Mood normal.        Behavior: Behavior normal.      Consultants:    Procedures:     Data Reviewed: Results for orders placed or performed during the hospital encounter of 03/28/24 (from the past 24 hours)  Glucose, capillary     Status: Abnormal   Collection Time: 04/02/24  7:50 PM  Result Value Ref Range   Glucose-Capillary 110 (H) 70 - 99 mg/dL     I have reviewed pertinent nursing notes, vitals, labs, and images as necessary. I have ordered labwork to follow up on as indicated.  I have reviewed the last notes from staff over past 24 hours. I have discussed patient's care plan and test results with nursing staff, CM/SW, and other staff as appropriate.    LOS: 5 days   Alm Apo, MD [facility] 04/03/2024, 2:55 PM

## 2024-04-03 NOTE — Progress Notes (Signed)
 Occupational Therapy Treatment Patient Details Name: Dustin Hess MRN: [id number] DOB: [birth year] Today's Date: 04/03/2024   History of present illness 78 y.o. male was brought to the ER after patient was found to be increasingly weak, not eating well, lethargic with some confusion for the last 2 to 3 days, dx with acute metabolic encephalopathy. PMH history of CAD status post PCI in 2005, hypertension, hypothyroidism, blindness, anemia, BPH .   OT comments  Patient demonstrating gains with OT treatment. Patient performed transfer from EOB to recliner with one person HHA with tactile cues for chair placement and min to mod assist. Patient able to stand at sink for grooming tasks with min/mod assist to stand and for standing balance with min assist to perform grooming tasks. Patient able to stand for peri area bathing but required max assist to perform. Patient left in recliner with education on call bell use. Patient will benefit from continued inpatient follow up therapy, <3 hours/day. Acute OT to continue to follow to address established goals to facilitate DC to next venue of care.        If plan is discharge home, recommend the following:  Two people to help with walking and/or transfers;A lot of help with bathing/dressing/bathroom;Assistance with cooking/housework;Assistance with feeding;Assist for transportation;Help with stairs or ramp for entrance   Equipment Recommendations  Other (comment) (defer)    Recommendations for Other Services      Precautions / Restrictions Precautions Precautions: Fall Recall of Precautions/Restrictions: Impaired Precaution/Restrictions Comments: Blind Restrictions Weight Bearing Restrictions Per Provider Order: No       Mobility Bed Mobility Overal bed mobility: Needs Assistance Bed Mobility: Supine to Sit     Supine to sit: HOB elevated, Used rails, Mod assist     General bed mobility comments: assistance with scooting hips and  lifting trunk    Transfers Overall transfer level: Needs assistance Equipment used: 1 person hand held assist Transfers: Sit to/from Stand, Bed to chair/wheelchair/BSC Sit to Stand: Mod assist, Min assist Stand pivot transfers: Min assist, Mod assist         General transfer comment: tactile cues for chair placement and min to mod assist for sit to stands and pivoting towards chair     Balance Overall balance assessment: Needs assistance Sitting-balance support: Feet supported Sitting balance-Leahy Scale: Fair Sitting balance - Comments: EOB   Standing balance support: Single extremity supported Standing balance-Leahy Scale: Poor Standing balance comment: stood at sink for grooming tasks with min to mod assist for standing balance                           ADL either performed or assessed with clinical judgement   ADL Overall ADL's : Needs assistance/impaired     Grooming: Wash/dry hands;Wash/dry face;Oral care;Minimal assistance;Standing Grooming Details (indicate cue type and reason): stood at sink with min assist to perform grooming tasks and for balance while standing     Lower Body Bathing: Maximal assistance;Sit to/from stand Lower Body Bathing Details (indicate cue type and reason): assistance to clean peri area while standing Upper Body Dressing : Moderate assistance;Sitting Upper Body Dressing Details (indicate cue type and reason): to change gown Lower Body Dressing: Total assistance Lower Body Dressing Details (indicate cue type and reason): to donn socks Toilet Transfer: Minimal assistance;Moderate assistance;Stand-pivot Toilet Transfer Details (indicate cue type and reason): simulated to recliner           General ADL Comments: improvment with following commands  and performing self care tasks    Extremity/Trunk Assessment              Vision       Perception     Praxis     Communication Communication Communication:  Impaired Factors Affecting Communication: Reduced clarity of speech   Cognition Arousal: Alert Behavior During Therapy: WFL for tasks assessed/performed Cognition: Cognition impaired   Orientation impairments: Time   Memory impairment (select all impairments): Short-term memory Attention impairment (select first level of impairment): Focused attention Executive functioning impairment (select all impairments): Initiation, Sequencing OT - Cognition Comments: slow processing                 Following commands: Impaired Following commands impaired: Only follows one step commands consistently, Follows one step commands with increased time      Cueing   Cueing Techniques: Verbal cues, Gestural cues, Tactile cues  Exercises      Shoulder Instructions       General Comments      Pertinent Vitals/ Pain       Pain Assessment Pain Assessment: Faces Faces Pain Scale: No hurt Pain Intervention(s): Monitored during session  Home Living                                          Prior Functioning/Environment              Frequency  Min 2X/week        Progress Toward Goals  OT Goals(current goals can now be found in the care plan section)  Progress towards OT goals: Progressing toward goals  Acute Rehab OT Goals Patient Stated Goal: none stated OT Goal Formulation: Patient unable to participate in goal setting Time For Goal Achievement: 04/14/24 Potential to Achieve Goals: Fair ADL Goals Pt Will Perform Upper Body Dressing: with min assist;sitting Pt Will Transfer to Toilet: with min assist;bedside commode Pt Will Perform Toileting - Clothing Manipulation and hygiene: with min assist;sit to/from stand  Plan      Co-evaluation                 AM-PAC OT 6 Clicks Daily Activity     Outcome Measure   Help from another person eating meals?: A Lot Help from another person taking care of personal grooming?: A Little Help from another  person toileting, which includes using toliet, bedpan, or urinal?: A Lot Help from another person bathing (including washing, rinsing, drying)?: A Lot Help from another person to put on and taking off regular upper body clothing?: A Lot Help from another person to put on and taking off regular lower body clothing?: A Lot 6 Click Score: 13    End of Session Equipment Utilized During Treatment: Gait belt;Rolling walker (2 wheels)  OT Visit Diagnosis: Unsteadiness on feet (R26.81);Other abnormalities of gait and mobility (R26.89);Muscle weakness (generalized) (M62.81);History of falling (Z91.81);Other symptoms and signs involving cognitive function   Activity Tolerance Patient tolerated treatment well   Patient Left in chair;with call bell/phone within reach;with chair alarm set   Nurse Communication Mobility status        Time: 8881-8851 OT Time Calculation (min): 30 min  Charges: OT General Charges $OT Visit: 1 Visit OT Treatments $Self Care/Home Management : 23-37 mins  Dick Laine, OTA Acute Rehabilitation Services  Office [phone]   Jeb LITTIE Laine 04/03/2024, 1:57 PM

## 2024-04-04 ENCOUNTER — Encounter (HOSPITAL_COMMUNITY): Payer: Self-pay | Admitting: Internal Medicine

## 2024-04-04 DIAGNOSIS — R5381 Other malaise: Secondary | ICD-10-CM | POA: Diagnosis not present

## 2024-04-04 DIAGNOSIS — N179 Acute kidney failure, unspecified: Secondary | ICD-10-CM | POA: Diagnosis not present

## 2024-04-04 NOTE — Plan of Care (Signed)

## 2024-04-04 NOTE — Progress Notes (Signed)
 Progress Note    Dustin Hess   FMW:[id number]  DOB: September 14, 1946  DOA: 03/28/2024     6 PCP: [name], MD  Initial CC: [facility] Course: Dustin Hess is a 78 yo male with PMH chronic full bilateral blindness, CAD, HTN, hypothyroidism, anemia presented to the hospital with complaints of being lethargic. Found to have AKI with hypernatremia. He was also recently seen at ENT office on 03/25/2024 without notes of complaints at that time.  Assessment and Plan:  Physical deconditioning - awaiting SNF placement  Acute metabolic encephalopathy -resolved Secondary to AKI as well as hypernatremia Mentation improved.  Follows commands easily now CT head unremarkable for any acute abnormality - MRI brain also unremarkable, notably no stroke At baseline per son patient ambulates without any assistance, able to feed himself and mostly independent for his ADLs  AKI-resolved Hypernatremia-resolved Likely from poor p.o. intake although etiology is not clear CT scan negative for any acute abnormality - SLP following, advance diet as able -Creatinine 2.79 on admission which rapidly improved with fluids -Sodium levels also have normalized  Hyperbilirubinemia Cholelithiasis with CBD ULN-6 to 7 mm No abdominal tenderness on examination CT scan shows evidence of cholelithiasis CBD 6 to 7 mm without any evidence of filling defect on ultrasound  Hypothyroidism TSH was normal, 1.63 Continue Synthroid   HTN Patient takes Imdur , metoprolol  at home Currently blood pressure is soft therefore holding this medication Continue hydralazine  as needed  Macrocytosis Relative B12 deficiency Iron deficiency Chronic thrombocytopenia Hemoglobin is stable.  Between 12 and 13 MCV is more than 90.  Does not have microcytosis. B12 is relatively low.  389 Folic acid is normal.  28.7 Iron 35 Initiate supplementation Platelet count have been chronically low in 130s to  120s Currently platelet count is in 80s No abnormality on the smear  CAD HLD No chest pain.  EKG unremarkable Continue aspirin  On lipitor at home   BPH - Continue Flomax  and dutasteride   Dysphagia Underwent modified barium's study this admission -Continue dysphagia diet, advancement per SLP  Chronic bilateral complete blindness Cerebral atrophy Cerebral atrophy noted on the CT scan.  MRI brain also unremarkable. No focal deficit  Interval History:  No events overnight.  Resting comfortably in bed when seen today.  PT/OT recommending SNF. Stable for SNF. Listening to TV this morning.   Old records reviewed in assessment of this patient  Antimicrobials:   DVT prophylaxis:  SCDs Start: 03/28/24 2125   Code Status:   Code Status: Full Code  Mobility Assessment (Last 72 Hours)     Mobility Assessment     Row Name 04/04/24 0920 04/03/24 2215 04/03/24 1300 04/03/24 0900 04/02/24 2000   Does patient have an order for bedrest or is patient medically unstable No - Continue assessment No - Continue assessment -- No - Continue assessment No - Continue assessment   What is the highest level of mobility based on the progressive mobility assessment? Level 5 (Walks with assist in room/hall) - Balance while stepping forward/back and can walk in room with assist - Complete Level 4 (Walks with assist in room) - Balance while marching in place and cannot step forward and back - Complete Level 3 (Stands with assist) - Balance while standing  and cannot march in place Level 4 (Walks with assist in room) - Balance while marching in place and cannot step forward and back - Complete Level 4 (Walks with assist in room) - Balance while marching in place and cannot  step forward and back - Complete   Is the above level different from baseline mobility prior to current illness? -- Yes - Recommend PT order -- Yes - Recommend PT order Yes - Recommend PT order    Row Name 04/02/24 1443 04/02/24 0811  04/02/24 0556 04/02/24 0324 04/02/24 0141   Does patient have an order for bedrest or is patient medically unstable -- No - Continue assessment No - Continue assessment No - Continue assessment No - Continue assessment   What is the highest level of mobility based on the progressive mobility assessment? Level 4 (Walks with assist in room) - Balance while marching in place and cannot step forward and back - Complete Level 1 (Bedfast) - Unable to balance while sitting on edge of bed Level 1 (Bedfast) - Unable to balance while sitting on edge of bed Level 1 (Bedfast) - Unable to balance while sitting on edge of bed Level 1 (Bedfast) - Unable to balance while sitting on edge of bed   Is the above level different from baseline mobility prior to current illness? -- -- Yes - Recommend PT order Yes - Recommend PT order Yes - Recommend PT order    Row Name 04/01/24 2344 04/01/24 2230 04/01/24 1955       Does patient have an order for bedrest or is patient medically unstable No - Continue assessment No - Continue assessment No - Continue assessment     What is the highest level of mobility based on the progressive mobility assessment? Level 1 (Bedfast) - Unable to balance while sitting on edge of bed Level 1 (Bedfast) - Unable to balance while sitting on edge of bed Level 1 (Bedfast) - Unable to balance while sitting on edge of bed     Is the above level different from baseline mobility prior to current illness? Yes - Recommend PT order Yes - Recommend PT order Yes - Recommend PT order        Barriers to discharge: None Disposition Plan: SNF HH orders placed: N/A Status is: Inpatient  Objective: Blood pressure (!) 154/81, pulse 89, temperature 97.8 F (36.6 C), resp. rate 17, height 5' 3 (1.6 m), weight 64.4 kg, SpO2 99%.  Examination:  Physical Exam Constitutional:      General: He is not in acute distress.    Appearance: Normal appearance.  HENT:     Head: Normocephalic and atraumatic.      Mouth/Throat:     Mouth: Mucous membranes are moist.  Eyes:     Comments: Completely whited out eyes, no pupils noted  Cardiovascular:     Rate and Rhythm: Normal rate and regular rhythm.  Pulmonary:     Effort: Pulmonary effort is normal. No respiratory distress.     Breath sounds: Normal breath sounds. No wheezing.  Abdominal:     General: Bowel sounds are normal. There is no distension.     Palpations: Abdomen is soft.     Tenderness: There is no abdominal tenderness.  Musculoskeletal:        General: Normal range of motion.     Cervical back: Normal range of motion and neck supple.  Skin:    General: Skin is warm and dry.  Neurological:     General: No focal deficit present.     Mental Status: He is alert. Mental status is at baseline.     Comments: Follows commands easily and moves all 4 extremities  Psychiatric:        Mood and  Affect: Mood normal.        Behavior: Behavior normal.      Consultants:    Procedures:    Data Reviewed: No results found for this or any previous visit (from the past 24 hours).    I have reviewed pertinent nursing notes, vitals, labs, and images as necessary. I have ordered labwork to follow up on as indicated.  I have reviewed the last notes from staff over past 24 hours. I have discussed patient's care plan and test results with nursing staff, CM/SW, and other staff as appropriate.    LOS: 6 days   Alm Apo, MD [facility] 04/04/2024, 11:23 AM

## 2024-04-04 NOTE — Plan of Care (Signed)

## 2024-04-05 ENCOUNTER — Ambulatory Visit: Payer: Self-pay | Admitting: Podiatry

## 2024-04-05 ENCOUNTER — Inpatient Hospital Stay (HOSPITAL_COMMUNITY)

## 2024-04-05 DIAGNOSIS — D696 Thrombocytopenia, unspecified: Secondary | ICD-10-CM

## 2024-04-05 LAB — GLUCOSE, CAPILLARY: Glucose-Capillary: 98 mg/dL (ref 70–99)

## 2024-04-05 MED ORDER — FERROUS SULFATE 325 (65 FE) MG PO TBEC: 325.0000 mg | DELAYED_RELEASE_TABLET | Freq: Every day | ORAL | Status: AC

## 2024-04-05 MED ORDER — ISOSORBIDE MONONITRATE ER 30 MG PO TB24: 120.0000 mg | ORAL_TABLET | Freq: Every day | ORAL | Status: DC

## 2024-04-05 MED ORDER — METOPROLOL SUCCINATE ER 25 MG PO TB24: 25.0000 mg | ORAL_TABLET | Freq: Every day | ORAL | Status: AC

## 2024-04-05 MED ORDER — SENNOSIDES-DOCUSATE SODIUM 8.6-50 MG PO TABS: 1.0000 | ORAL_TABLET | Freq: Every day | ORAL | Status: AC

## 2024-04-05 MED ORDER — ISOSORBIDE MONONITRATE ER 30 MG PO TB24: 30.0000 mg | ORAL_TABLET | Freq: Every day | ORAL | Status: DC

## 2024-04-05 NOTE — Progress Notes (Signed)
 Speech Language Pathology Treatment: Dysphagia  Patient Details Name: Dustin Hess MRN: [id number] DOB: [birth year] Today's Date: 04/05/2024 Time: 8990-8980 SLP Time Calculation (min) (ACUTE ONLY): 10 min  Assessment / Plan / Recommendation Clinical Impression  Pt seen for ongoing clinical swallow assessment to determine readiness for diet advancement. He continues to exhibit multiple swallow with sips of thin liquids, concerning for pharyngeal residue. Recommend repeat MBSS to objectively re-assess swallow function to determine safety of diet advancement.   MBSS scheduled for today, 7/7, @ 12:30  Continue SLP PoC.    HPI HPI: Dustin Hess is a 78 y.o. male who was brought to the ER after patient was found to be increasingly weak, not eating well, lethargic and confused. Dx acute metabolic encephalopathy, AKI, hypernatremia. PMHxCAD status post PCI in 2005, hypertension, hypothyroidism, blindness, anemia, BPH.      SLP Plan  Continue with current plan of care;MBS (scheduled for 12:30 today)          Recommendations  Diet recommendations: Dysphagia 1 (puree);Nectar-thick liquid Liquids provided via: Straw;Cup Medication Administration: Crushed with puree Supervision: Full supervision/cueing for compensatory strategies Compensations: Slow rate;Small sips/bites;Follow solids with liquid;Multiple dry swallows after each bite/sip Postural Changes and/or Swallow Maneuvers: Seated upright 90 degrees;Upright 30-60 min after meal                  Oral care BID   Frequent or constant Supervision/Assistance Dysphagia, oropharyngeal phase (R13.12)     Continue with current plan of care;MBS (scheduled for 12:30 today)     [name]  04/05/2024, 10:53 AM

## 2024-04-05 NOTE — Progress Notes (Signed)
 Modified Barium Swallow Study  Patient Details  Name: Dustin Hess MRN: [id number] Date of Birth: 03/12/1946  Today's Date: 04/05/2024  Modified Barium Swallow completed.  Full report located under Chart Review in the Imaging Section.  History of Present Illness Dustin Hess is a 78 y.o. male who was brought to the ER after patient was found to be increasingly weak, not eating well, lethargic and confused. Dx acute metabolic encephalopathy, AKI, hypernatremia. PMHxCAD status post PCI in 2005, hypertension, hypothyroidism, blindness, anemia, BPH.   Clinical Impression Pt presents with improvements related to oropharyngeal function, but continues to exhibit moderate dysphagia primarily due to the amount of pharyngeal residue. His oral phase is much more timely and swallow initiation is consistently triggered at the pyrifrom sinuses. Epiglottic inversion is improved but is not complete. This is expected to be chronic given anterior curvature of pt's cervical spine; however, the improvement from absent to partial results in better airway protection. No penetration/aspiration occurred with any consistencies trialed today. The amount of residue with liquids appears generally unchanged since previous MBS 6/30 but has decreased with solids due to more complete epiglottic inversion with heavier boluses. Recommend upgrading to Dys 2 solids with thin liquids and ongoing dysphagia intervention with SLP at next venue of care. Discussed with pt, RN, and MD. SLP will f/u.  DIGEST Swallow Severity Rating*  Safety: 0  Efficiency: 3  Overall Pharyngeal Swallow Severity: 2 (moderate) 1: mild; 2: moderate; 3: severe; 4: profound  *The Dynamic Imaging Grade of Swallowing Toxicity is standardized for the head and neck cancer population, however, demonstrates promising clinical applications across populations to standardize the clinical rating of pharyngeal swallow safety and severity.  Factors  that may increase risk of adverse event in presence of aspiration [name] & [name] 2021): Poor general health and/or compromised immunity;Frail or deconditioned  Swallow Evaluation Recommendations Recommendations: PO diet PO Diet Recommendation: Dysphagia 2 (Finely chopped);Thin liquids (Level 0) Liquid Administration via: Cup;Straw Medication Administration: Crushed with puree Supervision: Full assist for feeding;Full supervision/cueing for swallowing strategies Swallowing strategies  : Minimize environmental distractions;Slow rate;Small bites/sips;Multiple dry swallows after each bite/sip;Follow solids with liquids Postural changes: Position pt fully upright for meals;Stay upright 30-60 min after meals Oral care recommendations: Oral care BID (2x/day)    Damien Blumenthal, M.A., CCC-SLP Speech Language Pathology, Acute Rehabilitation Services  Secure Chat preferred [phone]  04/05/2024,12:53 PM

## 2024-04-05 NOTE — TOC Transition Note (Signed)
 Transition of Care [facility]) - Discharge Note   Patient Details  Name: Dustin Hess MRN: [id number] Date of Birth: 30-Aug-1946  [facility]) CM/SW Contact:  Jeoffrey LITTIE Moose, LCSW Phone Number: 04/05/2024, 2:19 PM   Clinical Narrative:    Patient will DC to: [facility] Anticipated DC date: 04/05/24 Family notified: Yes Transport by: [facility]   Per MD patient ready for DC to [facility] . RN to call report prior to discharge [phone].  RN, patient, patient's family, and facility notified of DC. Discharge Summary and FL2 sent to facility. DC packet on chart. Ambulance transport requested for patient.   CSW will sign off for now as social work intervention is no longer needed. Please consult us  again if new needs arise.     Final next level of care: Skilled Nursing Facility Barriers to Discharge: Barriers Resolved   Patient Goals and CMS Choice Patient states their goals for this hospitalization and ongoing recovery are:: Rehab CMS Medicare.gov Compare Post Acute Care list provided to:: Patient Represenative (must comment) Choice offered to / list presented to : Spouse [place] ownership interest in [facility].provided to:: Spouse    Discharge Placement   Existing PASRR number confirmed : 04/05/24          Patient chooses bed at: [facility] Patient to be transferred to facility by: [facility] Name of family member notified: Dorothy Patient and family notified of of transfer: 04/05/24  Discharge Plan and Services Additional resources added to the After Visit Summary for   In-house Referral: Clinical Social Work   Post Acute Care Choice: Skilled Nursing Facility                               Social Drivers of Health (SDOH) Interventions SDOH Screenings   Food Insecurity: No Food Insecurity (03/29/2024)  Housing: Unknown (03/29/2024)  Transportation Needs: No Transportation Needs (03/29/2024)  Utilities: Not At Risk (03/29/2024)  Social  Connections: Socially Isolated (03/29/2024)  Tobacco Use: Low Risk  (04/04/2024)     Readmission Risk Interventions     No data to display

## 2024-04-05 NOTE — TOC Progression Note (Addendum)
 Transition of Care [facility]) - Progression Note    Patient Details  Name: Dustin Hess MRN: [id number] Date of Birth: 08-Apr-1946  [facility]) CM/SW Contact  Inocente GORMAN Kindle, LCSW Phone Number: 04/05/2024, 9:25 AM  Clinical Narrative:    9:25am-CSW awaiting confirmation from [facility] that they can accept patient today as they are inquiring about status of patient having restraints. RN confirmed no restraints.   Insurance approval received, Certification Number [id number], effective until 04/09/24.   11:53 AM-[facility] able to accept patient today. CSW updated daughter (wife was unavailable). Will require [facility] for transport.   Expected Discharge Plan: Skilled Nursing Facility Barriers to Discharge: SNF Pending bed offer  Expected Discharge Plan and Services In-house Referral: Clinical Social Work   Post Acute Care Choice: Skilled Nursing Facility Living arrangements for the past 2 months: Single Family Home                                       Social Determinants of Health (SDOH) Interventions SDOH Screenings   Food Insecurity: No Food Insecurity (03/29/2024)  Housing: Unknown (03/29/2024)  Transportation Needs: No Transportation Needs (03/29/2024)  Utilities: Not At Risk (03/29/2024)  Social Connections: Socially Isolated (03/29/2024)  Tobacco Use: Low Risk  (04/04/2024)    Readmission Risk Interventions     No data to display

## 2024-04-05 NOTE — Discharge Summary (Addendum)
 Physician Discharge Summary   Dustin Hess DOB: Feb 19, 1946 DOA: 03/28/2024  PCP: [name], MD  Admit date: 03/28/2024 Discharge date: 04/05/2024   Admitted From: Home Disposition:  SNF Discharging physician: Alm Apo, MD Barriers to discharge: none  Recommendations at discharge: If BP elevates, can increase imdur  further (doses of imdur  and toprol  lowered due to hypotension on admission)  Discharge Condition: stable CODE STATUS: Full  Diet recommendation:  Diet Orders (From admission, onward)     Start     Ordered   04/05/24 1244  DIET DYS 2 Room service appropriate? Yes with Assist; Fluid consistency: Thin  Diet effective now       Question Answer Comment  Room service appropriate? Yes with Assist   Fluid consistency: Thin      04/05/24 1244            Hospital Course: Dustin. Knippel is a 78 yo male with PMH chronic full bilateral blindness, CAD, HTN, hypothyroidism, anemia presented to the hospital with complaints of being lethargic. Found to have AKI with hypernatremia. He was also recently seen at ENT office on 03/25/2024 without notes of complaints at that time.  Assessment and Plan:  Physical deconditioning - awaiting SNF placement  Acute metabolic encephalopathy -resolved Secondary to AKI as well as hypernatremia Mentation improved.  Follows commands easily now CT head unremarkable for any acute abnormality - MRI brain also unremarkable, notably no stroke At baseline per son patient ambulates without any assistance, able to feed himself and mostly independent for his ADLs  AKI-resolved Hypernatremia-resolved Likely from poor p.o. intake although etiology is not clear CT scan negative for any acute abnormality - SLP following, advance diet as able -Creatinine 2.79 on admission which rapidly improved with fluids -Sodium levels also have normalized  Hyperbilirubinemia Cholelithiasis with CBD ULN-6 to 7 mm No  abdominal tenderness on examination CT scan shows evidence of cholelithiasis CBD 6 to 7 mm without any evidence of filling defect on ultrasound  Hypothyroidism TSH was normal, 1.63 Continue Synthroid   HTN - BP soft on admission, has recovered some but home doses high (imdur  120 mg and toprol  100 mg) - will resume but at lower doses; can be adjusted further in future as necessary  Macrocytosis Relative B12 deficiency Iron deficiency Chronic thrombocytopenia Hemoglobin is stable.  Between 12 and 13 MCV is more than 90.  Does not have microcytosis. B12 is relatively low.  389 Folic acid is normal.  28.7 Iron 35 Initiate supplementation Platelet count have been chronically low in 130s to 120s Currently platelet count is in 80s No abnormality on the smear  CAD HLD No chest pain.  EKG unremarkable Continue aspirin  On lipitor at home   BPH - Continue Flomax  and dutasteride   Dysphagia Underwent modified barium's study this admission - Repeat swallow study also performed on 04/05/2024 - Diet upgraded to dysphagia level 2 prior to discharge  Chronic bilateral complete blindness Cerebral atrophy Cerebral atrophy noted on the CT scan.  MRI brain also unremarkable. No focal deficit   Principal Diagnosis: AKI (acute kidney injury) [facility])  Discharge Diagnoses: Active Hospital Problems   Diagnosis Date Noted   Physical deconditioning 03/31/2024    Priority: 2.   Thrombocytopenia, unspecified (HCC) 12/22/2013    Priority: 3.   CAD (coronary artery disease) of artery bypass graft 11/08/2015   Blindness, congenital 04/28/2013   Benign prostatic hyperplasia 04/28/2013   Hypothyroidism 12/13/2008   Essential hypertension, benign 12/13/2008    Resolved Hospital Problems  Diagnosis Date Noted Date Resolved   AKI (acute kidney injury) (HCC) 03/28/2024 03/31/2024    Priority: 1.   Acute metabolic encephalopathy 03/29/2024 03/31/2024    Priority: 1.   Hypernatremia 03/28/2024  03/31/2024    Priority: 1.     Discharge Instructions     Increase activity slowly   Complete by: As directed    No wound care   Complete by: As directed       Allergies as of 04/05/2024       Reactions   Benadryl [diphenhydramine Hcl] Palpitations   2014  Took one tablet and had heart palpitations        Medication List     TAKE these medications    acetaminophen  650 MG CR tablet Commonly known as: TYLENOL  Take 650 mg by mouth every 8 (eight) hours as needed for pain.   aspirin  81 MG tablet Take 81 mg by mouth daily.   atorvastatin  20 MG tablet Commonly known as: LIPITOR Take 20 mg by mouth daily.   dutasteride  0.5 MG capsule Commonly known as: AVODART  Take 0.5 mg by mouth daily.   ferrous sulfate  325 (65 FE) MG EC tablet Take 1 tablet (325 mg total) by mouth daily with breakfast.   isosorbide  mononitrate 30 MG 24 hr tablet Commonly known as: IMDUR  Take 1 tablet (30 mg total) by mouth daily. What changed:  medication strength how much to take   levothyroxine  88 MCG tablet Commonly known as: SYNTHROID  Take 88 mcg by mouth daily before breakfast.   metoprolol  succinate 25 MG 24 hr tablet Commonly known as: TOPROL -XL Take 1 tablet (25 mg total) by mouth daily. TAKE WITH OR IMMEDIATELY FOLLOWING A MEAL. What changed:  medication strength how much to take   multivitamin tablet Take 1 tablet by mouth daily.   nitroGLYCERIN  0.4 MG SL tablet Commonly known as: NITROSTAT  Place 1 tablet (0.4 mg total) under the tongue every 5 (five) minutes as needed. Must call and schedule appt for future refills   senna-docusate 8.6-50 MG tablet Commonly known as: Senokot-S Take 1 tablet by mouth daily.   tamsulosin  0.4 MG Caps capsule Commonly known as: FLOMAX  Take 0.8 mg by mouth daily.   Vitamin D3 25 MCG (1000 UT) Caps Take 1 capsule by mouth daily.        Contact information for after-discharge care     Destination     [facility] .   Service:  Skilled Nursing Contact information: [address] [place]  [address] [phone]                    Allergies  Allergen Reactions   Benadryl [Diphenhydramine Hcl] Palpitations    2014  Took one tablet and had heart palpitations    Consultations:   Procedures:   Discharge Exam: BP 130/84 (BP Location: Left Arm)   Pulse 84   Temp 97.8 F (36.6 C)   Resp 16   Ht 5' 3 (1.6 m)   Wt 64.4 kg   SpO2 100%   BMI 25.15 kg/m  Physical Exam Constitutional:      General: He is not in acute distress.    Appearance: Normal appearance.  HENT:     Head: Normocephalic and atraumatic.     Mouth/Throat:     Mouth: Mucous membranes are moist.  Eyes:     Comments: Completely whited out eyes, no pupils noted  Cardiovascular:     Rate and Rhythm: Normal rate and regular  rhythm.  Pulmonary:     Effort: Pulmonary effort is normal. No respiratory distress.     Breath sounds: Normal breath sounds. No wheezing.  Abdominal:     General: Bowel sounds are normal. There is no distension.     Palpations: Abdomen is soft.     Tenderness: There is no abdominal tenderness.  Musculoskeletal:        General: Normal range of motion.     Cervical back: Normal range of motion and neck supple.  Skin:    General: Skin is warm and dry.  Neurological:     General: No focal deficit present.     Mental Status: He is alert. Mental status is at baseline.     Comments: Follows commands easily and moves all 4 extremities  Psychiatric:        Mood and Affect: Mood normal.        Behavior: Behavior normal.      The results of significant diagnostics from this hospitalization (including imaging, microbiology, ancillary and laboratory) are listed below for reference.   Microbiology: Recent Results (from the past 240 hours)  Urine Culture     Status: Abnormal   Collection Time: 03/28/24  3:15 PM   Specimen: Urine, Clean Catch  Result Value Ref Range Status   Specimen  Description URINE, CLEAN CATCH  Final   Special Requests NONE  Final   Culture (A)  Final    <10,000 COLONIES/mL INSIGNIFICANT GROWTH Performed at [facility], [address] [place], [place] [address]    Report Status 03/29/2024 FINAL  Final  Resp panel by RT-PCR (RSV, Flu A&B, Covid) Anterior Nasal Swab     Status: None   Collection Time: 03/28/24  3:15 PM   Specimen: Anterior Nasal Swab  Result Value Ref Range Status   SARS Coronavirus 2 by RT PCR NEGATIVE NEGATIVE Final   Influenza A by PCR NEGATIVE NEGATIVE Final   Influenza B by PCR NEGATIVE NEGATIVE Final    Comment: (NOTE) The Xpert Xpress SARS-CoV-2/FLU/RSV plus assay is intended as an aid in the diagnosis of influenza from Nasopharyngeal swab specimens and should not be used as a sole basis for treatment. Nasal washings and aspirates are unacceptable for Xpert Xpress SARS-CoV-2/FLU/RSV testing.  Fact Sheet for Patients: BloggerCourse.com  Fact Sheet for Healthcare Providers: SeriousBroker.it  This test is not yet approved or cleared by the [place]  FDA and has been authorized for detection and/or diagnosis of SARS-CoV-2 by FDA under an Emergency Use Authorization (EUA). This EUA will remain in effect (meaning this test can be used) for the duration of the COVID-19 declaration under Section 564(b)(1) of the Act, 21 U.S.C. section 360bbb-3(b)(1), unless the authorization is terminated or revoked.     Resp Syncytial Virus by PCR NEGATIVE NEGATIVE Final    Comment: (NOTE) Fact Sheet for Patients: BloggerCourse.com  Fact Sheet for Healthcare Providers: SeriousBroker.it  This test is not yet approved or cleared by the [place]  FDA and has been authorized for detection and/or diagnosis of SARS-CoV-2 by FDA under an Emergency Use Authorization (EUA). This EUA will remain in effect (meaning this test can  be used) for the duration of the COVID-19 declaration under Section 564(b)(1) of the Act, 21 U.S.C. section 360bbb-3(b)(1), unless the authorization is terminated or revoked.  Performed at [facility], [address], [place], [place] [address]      Labs: BNP (last 3 results) Recent Labs    08/28/23 1744  BNP  297.9*   Basic Metabolic Panel: Recent Labs  Lab 03/30/24 0338 03/30/24 0435 03/30/24 1206 03/30/24 1951 03/31/24 0740  NA 145 147* 146* 143 143  K 3.3* 3.4* 3.6 3.6 3.7  CL 113* 114* 111 111 110  CO2 26 25 26 26 26   GLUCOSE 101* 110* 114* 123* 98  BUN 27* 27* 23 22 21   CREATININE 0.89 0.88 0.80 0.91 0.84  CALCIUM  9.2 9.2 9.1 9.0 8.9  MG 1.9  --   --   --  1.9  PHOS 2.5  --   --   --   --    Liver Function Tests: Recent Labs  Lab 03/30/24 0338 03/31/24 0740  AST 27 22  ALT 17 15  ALKPHOS 33* 31*  BILITOT 1.7* 1.2  PROT 6.7 5.9*  ALBUMIN 3.5 3.0*   No results for input(s): LIPASE, AMYLASE in the last 168 hours. No results for input(s): AMMONIA in the last 168 hours. CBC: Recent Labs  Lab 03/30/24 0338 03/31/24 0740  WBC 5.6 4.6  NEUTROABS 4.6 3.3  HGB 14.3 13.5  HCT 44.1 40.9  MCV 98.7 97.6  PLT 81* 74*   Cardiac Enzymes: No results for input(s): CKTOTAL, CKMB, CKMBINDEX, TROPONINI in the last 168 hours. BNP: Invalid input(s): POCBNP CBG: Recent Labs  Lab 04/02/24 1950 04/05/24 0727  GLUCAP 110* 98   D-Dimer No results for input(s): DDIMER in the last 72 hours. Hgb A1c No results for input(s): HGBA1C in the last 72 hours. Lipid Profile No results for input(s): CHOL, HDL, LDLCALC, TRIG, CHOLHDL, LDLDIRECT in the last 72 hours. Thyroid  function studies No results for input(s): TSH, T4TOTAL, T3FREE, THYROIDAB in the last 72 hours.  Invalid input(s): FREET3 Anemia work up No results for input(s): VITAMINB12, FOLATE, FERRITIN, TIBC, IRON, RETICCTPCT in the last 72  hours. Urinalysis    Component Value Date/Time   COLORURINE AMBER (A) 03/28/2024 1410   APPEARANCEUR CLOUDY (A) 03/28/2024 1410   LABSPEC 1.024 03/28/2024 1410   PHURINE 5.0 03/28/2024 1410   GLUCOSEU NEGATIVE 03/28/2024 1410   HGBUR MODERATE (A) 03/28/2024 1410   BILIRUBINUR SMALL (A) 03/28/2024 1410   KETONESUR NEGATIVE 03/28/2024 1410   PROTEINUR 100 (A) 03/28/2024 1410   UROBILINOGEN 1.0 12/28/2007 0801   NITRITE NEGATIVE 03/28/2024 1410   LEUKOCYTESUR NEGATIVE 03/28/2024 1410   Sepsis Labs Recent Labs  Lab 03/30/24 0338 03/31/24 0740  WBC 5.6 4.6   Microbiology Recent Results (from the past 240 hours)  Urine Culture     Status: Abnormal   Collection Time: 03/28/24  3:15 PM   Specimen: Urine, Clean Catch  Result Value Ref Range Status   Specimen Description URINE, CLEAN CATCH  Final   Special Requests NONE  Final   Culture (A)  Final    <10,000 COLONIES/mL INSIGNIFICANT GROWTH Performed at [facility], [address] [place], [place] [address]    Report Status 03/29/2024 FINAL  Final  Resp panel by RT-PCR (RSV, Flu A&B, Covid) Anterior Nasal Swab     Status: None   Collection Time: 03/28/24  3:15 PM   Specimen: Anterior Nasal Swab  Result Value Ref Range Status   SARS Coronavirus 2 by RT PCR NEGATIVE NEGATIVE Final   Influenza A by PCR NEGATIVE NEGATIVE Final   Influenza B by PCR NEGATIVE NEGATIVE Final    Comment: (NOTE) The Xpert Xpress SARS-CoV-2/FLU/RSV plus assay is intended as an aid in the diagnosis of influenza from Nasopharyngeal swab specimens and should not be used as  a sole basis for treatment. Nasal washings and aspirates are unacceptable for Xpert Xpress SARS-CoV-2/FLU/RSV testing.  Fact Sheet for Patients: BloggerCourse.com  Fact Sheet for Healthcare Providers: SeriousBroker.it  This test is not yet approved or cleared by the [place]  FDA and has been authorized for detection  and/or diagnosis of SARS-CoV-2 by FDA under an Emergency Use Authorization (EUA). This EUA will remain in effect (meaning this test can be used) for the duration of the COVID-19 declaration under Section 564(b)(1) of the Act, 21 U.S.C. section 360bbb-3(b)(1), unless the authorization is terminated or revoked.     Resp Syncytial Virus by PCR NEGATIVE NEGATIVE Final    Comment: (NOTE) Fact Sheet for Patients: BloggerCourse.com  Fact Sheet for Healthcare Providers: SeriousBroker.it  This test is not yet approved or cleared by the [place]  FDA and has been authorized for detection and/or diagnosis of SARS-CoV-2 by FDA under an Emergency Use Authorization (EUA). This EUA will remain in effect (meaning this test can be used) for the duration of the COVID-19 declaration under Section 564(b)(1) of the Act, 21 U.S.C. section 360bbb-3(b)(1), unless the authorization is terminated or revoked.  Performed at [facility], [address], [place], [place] [address]     Procedures/Studies: DG Swallowing Func-Speech Pathology Result Date: 04/05/2024 Table formatting from the original result was not included. Modified Barium Swallow Study Patient Details Name: Dustin Hess MRN: [id number] Date of Birth: 12/19/1945 Today's Date: 04/05/2024 HPI/PMH: HPI: SEMAJE KINKER is a 78 y.o. male who was brought to the ER after patient was found to be increasingly weak, not eating well, lethargic and confused. Dx acute metabolic encephalopathy, AKI, hypernatremia. PMHxCAD status post PCI in 2005, hypertension, hypothyroidism, blindness, anemia, BPH. Clinical Impression: Clinical Impression: Pt presents with improvements related to oropharyngeal function, but continues to exhibit moderate dysphagia primarily due to the amount of pharyngeal residue. His oral phase is much more timely and swallow initiation is consistently triggered at the  pyrifrom sinuses. Epiglottic inversion is improved but is not complete. This is expected to be chronic given anterior curvature of pt's cervical spine; however, the improvement from absent to partial results in better airway protection. No penetration/aspiration occurred with any consistencies trialed today. The amount of residue with liquids appears generally unchanged since previous MBS 6/30 but has decreased with solids due to more complete epiglottic inversion with heavier boluses. Recommend upgrading to Dys 2 solids with thin liquids and ongoing dysphagia intervention with SLP at next venue of care. Discussed with pt, RN, and MD. SLP will f/u. DIGEST Swallow Severity Rating*  Safety: 0  Efficiency: 3  Overall Pharyngeal Swallow Severity: 2 (moderate) 1: mild; 2: moderate; 3: severe; 4: profound *The Dynamic Imaging Grade of Swallowing Toxicity is standardized for the head and neck cancer population, however, demonstrates promising clinical applications across populations to standardize the clinical rating of pharyngeal swallow safety and severity. Factors that may increase risk of adverse event in presence of aspiration [name] & [name] 2021): Factors that may increase risk of adverse event in presence of aspiration [name] & [name] 2021): Poor general health and/or compromised immunity; Frail or deconditioned Recommendations/Plan: Swallowing Evaluation Recommendations Swallowing Evaluation Recommendations Recommendations: PO diet PO Diet Recommendation: Dysphagia 2 (Finely chopped); Thin liquids (Level 0) Liquid Administration via: Cup; Straw Medication Administration: Crushed with puree Supervision: Full assist for feeding; Full supervision/cueing for swallowing strategies Swallowing strategies  : Minimize environmental distractions; Slow rate; Small bites/sips; Multiple dry swallows after each bite/sip; Follow solids with liquids Postural  changes: Position pt fully upright for meals; Stay upright 30-60 min  after meals Oral care recommendations: Oral care BID (2x/day) Treatment Plan Treatment Plan Treatment recommendations: Therapy as outlined in treatment plan below Follow-up recommendations: Skilled nursing-short term rehab (<3 hours/day) Functional status assessment: Patient has had a recent decline in their functional status and demonstrates the ability to make significant improvements in function in a reasonable and predictable amount of time. Treatment frequency: Min 2x/week Treatment duration: 2 weeks Interventions: Aspiration precaution training; Oropharyngeal exercises; Patient/family education; Trials of upgraded texture/liquids; Diet toleration management by SLP Recommendations Recommendations for follow up therapy are one component of a multi-disciplinary discharge planning process, led by the attending physician.  Recommendations may be updated based on patient status, additional functional criteria and insurance authorization. Assessment: Orofacial Exam: Orofacial Exam Oral Cavity: Oral Hygiene: WFL Oral Cavity - Dentition: Poor condition; Missing dentition Orofacial Anatomy: WFL Oral Motor/Sensory Function: WFL Anatomy: Anatomy: Suspected cervical osteophytes Boluses Administered: Boluses Administered Boluses Administered: Thin liquids (Level 0); Mildly thick liquids (Level 2, nectar thick); Moderately thick liquids (Level 3, honey thick); Puree; Solid  Oral Impairment Domain: Oral Impairment Domain Lip Closure: Interlabial escape, no progression to anterior lip Tongue control during bolus hold: Cohesive bolus between tongue to palatal seal Bolus preparation/mastication: Timely and efficient chewing and mashing Bolus transport/lingual motion: Brisk tongue motion Oral residue: Trace residue lining oral structures Location of oral residue : Tongue; Palate Initiation of pharyngeal swallow : Pyriform sinuses  Pharyngeal Impairment Domain: Pharyngeal Impairment Domain Soft palate elevation: No bolus between  soft palate (SP)/pharyngeal wall (PW) Laryngeal elevation: Complete superior movement of thyroid  cartilage with complete approximation of arytenoids to epiglottic petiole Anterior hyoid excursion: Complete anterior movement Epiglottic movement: Partial inversion Laryngeal vestibule closure: Complete, no air/contrast in laryngeal vestibule Pharyngeal stripping wave : Present - diminished Pharyngeal contraction (A/P view only): N/A Pharyngoesophageal segment opening: Partial distention/partial duration, partial obstruction of flow Tongue base retraction: Narrow column of contrast or air between tongue base and PPW Pharyngeal residue: Collection of residue within or on pharyngeal structures Location of pharyngeal residue: Tongue base; Valleculae; Pyriform sinuses  Esophageal Impairment Domain: No data recorded Pill: No data recorded Penetration/Aspiration Scale Score: Penetration/Aspiration Scale Score 1.  Material does not enter airway: Thin liquids (Level 0); Mildly thick liquids (Level 2, nectar thick); Moderately thick liquids (Level 3, honey thick); Puree; Solid Compensatory Strategies: Compensatory Strategies Compensatory strategies: No   General Information: Caregiver present: No  Diet Prior to this Study: Dysphagia 1 (pureed); Mildly thick liquids (Level 2, nectar thick)   Temperature : Normal   Respiratory Status: WFL   Supplemental O2: None (Room air)   History of Recent Intubation: No  Behavior/Cognition: Alert; Cooperative Self-Feeding Abilities: Dependent for feeding Baseline vocal quality/speech: Normal Volitional Cough: Able to elicit Volitional Swallow: Able to elicit Exam Limitations: No limitations Goal Planning: Prognosis for improved oropharyngeal function: Fair Barriers to Reach Goals: Time post onset; Severity of deficits No data recorded Patient/Family Stated Goal: none stated Consulted and agree with results and recommendations: Patient Pain: Pain Assessment Pain Assessment: No/denies pain End  of Session: Start Time:SLP Start Time (ACUTE ONLY): 1224 Stop Time: SLP Stop Time (ACUTE ONLY): 1239 Time Calculation:SLP Time Calculation (min) (ACUTE ONLY): 15 min Charges: SLP Evaluations $ SLP Speech Visit: 1 Visit SLP Evaluations $MBS Swallow: 1 Procedure $Swallowing Treatment: 1 Procedure SLP visit diagnosis: SLP Visit Diagnosis: Dysphagia, oropharyngeal phase (R13.12) Past Medical History: Past Medical History: Diagnosis Date  Anemia, unspecified 12/22/2013  Arthritis  Benign prostatic hypertrophy   Congenital blindness   Coronary artery disease   a. 06/2004 Inf STEMI with PCI/BMS to RCA/LCX;  b. Cath 2014 40-50% focal bradycardia circ stenosis with a patent stent, RCA 50% stenosis, distal 60% stenosis in a previously stented area. 06/2011 Neg MV, EF 67%.  Dyslipidemia   GERD (gastroesophageal reflux disease)   GI bleeding 2006  Hypertension   Hypothyroidism   Leukopenia 12/22/2013 Past Surgical History: Past Surgical History: Procedure Laterality Date  COLONOSCOPY    CORONARY ANGIOPLASTY WITH STENT PLACEMENT    ESOPHAGOGASTRODUODENOSCOPY    HEMORRHOID SURGERY    LEFT HEART CATHETERIZATION WITH CORONARY ANGIOGRAM N/A 04/30/2013  Procedure: LEFT HEART CATHETERIZATION WITH CORONARY ANGIOGRAM;  Surgeon: Ozell Fell, MD;  Location: [facility] CATH LAB;  Service: Cardiovascular;  Laterality: N/A; Damien Blumenthal, M.A., CCC-SLP Speech Language Pathology, Acute Rehabilitation Services Secure Chat preferred [phone] 04/05/2024, 12:59 PM  Dustin BRAIN WO CONTRAST Result Date: 03/30/2024 CLINICAL DATA:  Mental status change. EXAM: MRI HEAD WITHOUT CONTRAST TECHNIQUE: Multiplanar, multiecho pulse sequences of the brain and surrounding structures were obtained without intravenous contrast. COMPARISON:  CT head 03/28/2024. FINDINGS: Brain: No acute infarct. No evidence of intracranial hemorrhage. T2/FLAIR hyperintensity in the periventricular and subcortical white matter. Generalized parenchymal volume loss. There is mild prominence  of extra-axial spaces over the cerebral convexities without evidence of extra-axial fluid collection. No edema, mass effect, or midline shift. Posterior fossa is unremarkable. Normal appearance of midline structures. The basilar cisterns are patent. Ventricles: Normal size and configuration of the ventricles. Vascular: Skull base flow voids are visualized. Skull and upper cervical spine: No focal abnormality. Sinuses/Orbits: Ocular prostheses. Mucous retention cyst in the right maxillary sinus. Other: Mastoid air cells are clear. IMPRESSION: No acute intracranial abnormality. Mild chronic microvascular ischemic changes and mild parenchymal volume loss. Electronically Signed   By: Donnice Mania M.D.   On: 03/30/2024 17:10   DG Swallowing Func-Speech Pathology Result Date: 03/29/2024 Table formatting from the original result was not included. Modified Barium Swallow Study Patient Details Name: Dustin Hess MRN: [id number] Date of Birth: 07-24-1946 Today's Date: 03/29/2024 HPI/PMH: HPI: MARCAS Hess is a 78 y.o. male with history of CAD status post PCI in 2005, hypertension, hypothyroidism, blindness, anemia, BPH was brought to the ER after patient was found to be increasingly weak, not eating well, lethargic with some confusion for the last 2 to 3 days.  History was provided by patient's wife.  Per wife patient did not have any nausea vomiting or diarrhea did not complain of any chest pain or abdominal pain.  Was refusing to eat because of poor appetite and became more weak, tired, and lethargic. Clinical Impression: Clinical Impression: Pt exhibits moderate oropharyngeal dysphagia. This appears to primarily be a result of posture as his neck remains in a hyperextended position with limited mobility, which greatly affected his ability to try compensatory strategies. Due to the anterior curvature of his cervical spine, base of tongue retraction and epiglottic inversion are nearly absent. This  results in retention of the majority of each bolus along the base of tongue, valleculae, and posterior pharyngeal wall. With consecutive sips of thin liquids, the bolus reaches his vocal folds without sensation (PAS 5). When cued to cough, it was expelled. Nectar thick liquids allow slightly improved epiglottic deflection and thus, better airway protection. Even when given via consecutive straw sips, nectar thick liquids are transiently penetrated (PAS 2, considered WFL). While purees do not enter the laryngeal vestibule, there is minimal clearance  from the valleculae which is only slightly improved with a liquid wash and subswallows. Overall, there is minimal pharyngeal space for each bolus to flow through the pharynx and PES. Brief oral holding with tremulous swallow initiation was also observed. Recommend full nectar thick liquid diet with consideration of meds given via alternative means. He will require assistance with feeding as well as cueing to swallow multiple times and use a liquid wash. SLP will f/u. DIGEST Swallow Severity Rating*  Safety:  1  Efficiency: 3  Overall Pharyngeal Swallow Severity: 2 (moderate) 1: mild; 2: moderate; 3: severe; 4: profound *The Dynamic Imaging Grade of Swallowing Toxicity is standardized for the head and neck cancer population, however, demonstrates promising clinical applications across populations to standardize the clinical rating of pharyngeal swallow safety and severity.  Factors that may increase risk of adverse event in presence of aspiration [name] & [name] 2021): Factors that may increase risk of adverse event in presence of aspiration [name] & [name] 2021): Poor general health and/or compromised immunity; Reduced cognitive function; Limited mobility; Frail or deconditioned; Dependence for feeding and/or oral hygiene Recommendations/Plan: Swallowing Evaluation Recommendations Swallowing Evaluation Recommendations Recommendations: PO diet PO Diet Recommendation:  Mildly thick liquids (Level 2, nectar thick); Full liquid diet Liquid Administration via: Spoon; Cup; Straw Medication Administration: Via alternative means Supervision: Full assist for feeding; Full supervision/cueing for swallowing strategies Swallowing strategies  : Minimize environmental distractions; Slow rate; Small bites/sips; Multiple dry swallows after each bite/sip; Follow solids with liquids Postural changes: Position pt fully upright for meals; Stay upright 30-60 min after meals Oral care recommendations: Oral care BID (2x/day) Caregiver Recommendations: Avoid jello, ice cream, thin soups, popsicles; Remove water pitcher Treatment Plan Treatment Plan Treatment recommendations: Therapy as outlined in treatment plan below Follow-up recommendations: Home health SLP Functional status assessment: Patient has had a recent decline in their functional status and demonstrates the ability to make significant improvements in function in a reasonable and predictable amount of time. Treatment frequency: Min 2x/week Treatment duration: 2 weeks Interventions: Aspiration precaution training; Oropharyngeal exercises; Patient/family education; Trials of upgraded texture/liquids; Diet toleration management by SLP Recommendations Recommendations for follow up therapy are one component of a multi-disciplinary discharge planning process, led by the attending physician.  Recommendations may be updated based on patient status, additional functional criteria and insurance authorization. Assessment: Orofacial Exam: Orofacial Exam Oral Cavity: Oral Hygiene: WFL Oral Cavity - Dentition: Poor condition; Missing dentition Orofacial Anatomy: WFL Oral Motor/Sensory Function: WFL Anatomy: Anatomy: Suspected cervical osteophytes Boluses Administered: Boluses Administered Boluses Administered: Thin liquids (Level 0); Mildly thick liquids (Level 2, nectar thick); Puree  Oral Impairment Domain: Oral Impairment Domain Lip Closure: Escape  from interlabial space or lateral juncture, no extension beyond vermillion border Tongue control during bolus hold: Posterior escape of greater than half of bolus Bolus preparation/mastication: Slow prolonged chewing/mashing with complete recollection Bolus transport/lingual motion: Delayed initiation of tongue motion (oral holding) Oral residue: Residue collection on oral structures Location of oral residue : Tongue; Palate Initiation of pharyngeal swallow : Pyriform sinuses  Pharyngeal Impairment Domain: Pharyngeal Impairment Domain Soft palate elevation: No bolus between soft palate (SP)/pharyngeal wall (PW) Laryngeal elevation: Complete superior movement of thyroid  cartilage with complete approximation of arytenoids to epiglottic petiole Anterior hyoid excursion: Complete anterior movement Epiglottic movement: No inversion Laryngeal vestibule closure: Incomplete, narrow column air/contrast in laryngeal vestibule Pharyngeal stripping wave : Present - diminished Pharyngeal contraction (A/P view only): N/A Pharyngoesophageal segment opening: Minimal distention/minimal duration, marked obstruction of flow Tongue base retraction: Wide  column of contrast or air between tongue base and PPW Pharyngeal residue: Majority of contrast within or on pharyngeal structures Location of pharyngeal residue: Tongue base; Valleculae; Pharyngeal wall  Esophageal Impairment Domain: No data recorded Pill: No data recorded Penetration/Aspiration Scale Score: Penetration/Aspiration Scale Score 1.  Material does not enter airway: Puree 2.  Material enters airway, remains ABOVE vocal cords then ejected out: Mildly thick liquids (Level 2, nectar thick) 5.  Material enters airway, CONTACTS cords and not ejected out: Thin liquids (Level 0) Compensatory Strategies: Compensatory Strategies Compensatory strategies: No   General Information: Caregiver present: No  Diet Prior to this Study: NPO   Temperature : Normal   Respiratory Status: WFL    Supplemental O2: Nasal cannula   History of Recent Intubation: No  Behavior/Cognition: Requires cueing; Lethargic/Drowsy; Doesn't follow directions Self-Feeding Abilities: Dependent for feeding Baseline vocal quality/speech: Normal Volitional Cough: Able to elicit Volitional Swallow: Able to elicit Exam Limitations: No limitations Goal Planning: Prognosis for improved oropharyngeal function: Fair Barriers to Reach Goals: Cognitive deficits; Time post onset; Severity of deficits No data recorded Patient/Family Stated Goal: none stated Consulted and agree with results and recommendations: Pt unable/family or caregiver not available Pain: Pain Assessment Pain Assessment: Faces Faces Pain Scale: 0 Pain Intervention(s): Limited activity within patient's tolerance End of Session: Start Time:SLP Start Time (ACUTE ONLY): 1453 Stop Time: SLP Stop Time (ACUTE ONLY): 1517 Time Calculation:SLP Time Calculation (min) (ACUTE ONLY): 24 min Charges: SLP Evaluations $ SLP Speech Visit: 1 Visit SLP Evaluations $BSS Swallow: 1 Procedure $MBS Swallow: 1 Procedure SLP visit diagnosis: SLP Visit Diagnosis: Dysphagia, oropharyngeal phase (R13.12) Past Medical History: Past Medical History: Diagnosis Date  Anemia, unspecified 12/22/2013  Arthritis   Benign prostatic hypertrophy   Congenital blindness   Coronary artery disease   a. 06/2004 Inf STEMI with PCI/BMS to RCA/LCX;  b. Cath 2014 40-50% focal bradycardia circ stenosis with a patent stent, RCA 50% stenosis, distal 60% stenosis in a previously stented area. 06/2011 Neg MV, EF 67%.  Dyslipidemia   GERD (gastroesophageal reflux disease)   GI bleeding 2006  Hypertension   Hypothyroidism   Leukopenia 12/22/2013 Past Surgical History: Past Surgical History: Procedure Laterality Date  COLONOSCOPY    CORONARY ANGIOPLASTY WITH STENT PLACEMENT    ESOPHAGOGASTRODUODENOSCOPY    HEMORRHOID SURGERY    LEFT HEART CATHETERIZATION WITH CORONARY ANGIOGRAM N/A 04/30/2013  Procedure: LEFT HEART  CATHETERIZATION WITH CORONARY ANGIOGRAM;  Surgeon: Ozell Fell, MD;  Location: [facility] CATH LAB;  Service: Cardiovascular;  Laterality: N/A; Damien Blumenthal, M.A., CCC-SLP Speech Language Pathology, Acute Rehabilitation Services Secure Chat preferred [phone] 03/29/2024, 4:39 PM  US  Abdomen Limited RUQ (LIVER/GB) Result Date: 03/29/2024 CLINICAL DATA:  78 year old male with acute renal failure. Nausea vomiting. EXAM: ULTRASOUND ABDOMEN LIMITED RIGHT UPPER QUADRANT COMPARISON:  Noncontrast CT Abdomen and Pelvis yesterday. FINDINGS: Gallbladder: Layering gravel type stones as seen by CT yesterday. Superimposed dependent sludge within the gallbladder (image 3). Gallbladder wall thickness remains within normal limits. No pericholecystic fluid. No sonographic Murphy sign described. Common bile duct: Diameter: 6-7 mm, upper limits of normal. No filling defect within the visible duct. Liver: No intrahepatic biliary ductal dilatation identified. Liver echogenicity within normal limits. No discrete liver lesion. Portal vein is patent on color Doppler imaging with normal direction of blood flow towards the liver. Other: Echogenic right kidney (image 44) compatible with medical renal disease. No free fluid identified. IMPRESSION: 1. Cholelithiasis and gallbladder sludge but no strong imaging evidence of acute cholecystitis. 2. Common bile duct  diameter at the upper limits of normal. No intrahepatic ductal dilatation to strongly suggest choledocholithiasis. 3. Echogenic right kidney compatible with medical renal disease. Electronically Signed   By: VEAR Hurst M.D.   On: 03/29/2024 07:24   CT RENAL STONE STUDY Result Date: 03/28/2024 CLINICAL DATA:  Acute renal failure EXAM: CT ABDOMEN AND PELVIS WITHOUT CONTRAST TECHNIQUE: Multidetector CT imaging of the abdomen and pelvis was performed following the standard protocol without IV contrast. RADIATION DOSE REDUCTION: This exam was performed according to the departmental  dose-optimization program which includes automated exposure control, adjustment of the mA and/or kV according to patient size and/or use of iterative reconstruction technique. COMPARISON:  Remote CT 09/18/2005 FINDINGS: Lower chest: Normal heart size with coronary artery calcifications. Subsegmental right lower lobe atelectasis. Hepatobiliary: Motion artifact limitations. No evidence of focal liver abnormality on this unenhanced exam. Small gallstones layering in the gallbladder. No pericholecystic inflammation. No biliary dilatation. Pancreas: No ductal dilatation or inflammation. Spleen: Normal in size without focal abnormality. Adrenals/Urinary Tract: No adrenal nodule. No hydronephrosis. There are bilateral intrarenal calculi. No perinephric inflammation. No evidence of focal renal abnormality. Decompressed ureters. Partially distended urinary bladder, no bladder wall thickening. Stomach/Bowel: Decompressed stomach. No small bowel obstruction or inflammation. Normal appendix. Moderate volume of stool in the colon. Mild left colonic diverticulosis. No diverticulitis. No colonic inflammation. Vascular/Lymphatic: Advanced aortic and branch atherosclerosis. No aortic aneurysm. No adenopathy. Reproductive: Prostate is unremarkable. Other: No free air or ascites.  No abdominal wall hernia. Musculoskeletal: There are no acute or suspicious osseous abnormalities. Schmorl's nodes throughout the lumbar spine IMPRESSION: 1. No acute abnormality in the abdomen/pelvis. 2. Bilateral nonobstructing intrarenal calculi. 3. Cholelithiasis without cholecystitis. 4. Mild left colonic diverticulosis without diverticulitis. Aortic Atherosclerosis (ICD10-I70.0). Electronically Signed   By: Andrea Gasman M.D.   On: 03/28/2024 23:07   CT Head Wo Contrast Result Date: 03/28/2024 CLINICAL DATA:  Altered mental status. EXAM: CT HEAD WITHOUT CONTRAST TECHNIQUE: Contiguous axial images were obtained from the base of the skull through  the vertex without intravenous contrast. RADIATION DOSE REDUCTION: This exam was performed according to the departmental dose-optimization program which includes automated exposure control, adjustment of the mA and/or kV according to patient size and/or use of iterative reconstruction technique. COMPARISON:  August 28, 2023 FINDINGS: Brain: There is generalized cerebral atrophy with widening of the extra-axial spaces and ventricular dilatation. There are areas of decreased attenuation within the white matter tracts of the supratentorial brain, consistent with microvascular disease changes. Vascular: No hyperdense vessel or unexpected calcification. Skull: Normal. Negative for fracture or focal lesion. Sinuses/Orbits: A 1.9 cm right maxillary sinus polyp versus mucous retention cyst is seen. Postoperative changes are seen involving both globes. Other: None. IMPRESSION: 1. Generalized cerebral atrophy with chronic white matter small vessel ischemic changes. 2. No acute intracranial abnormality. 3. Right maxillary sinus polyp versus mucous retention cyst. Electronically Signed   By: Suzen Dials M.D.   On: 03/28/2024 17:49   DG Chest Portable 1 View Result Date: 03/28/2024 CLINICAL DATA:  Altered mental status, chills, fatigue and cough. EXAM: PORTABLE CHEST 1 VIEW COMPARISON:  August 28, 2023 FINDINGS: Limited study secondary to patient rotation. The heart size and mediastinal contours are within normal limits. Both lungs are clear. The visualized skeletal structures are unremarkable. IMPRESSION: No active disease. Electronically Signed   By: Suzen Dials M.D.   On: 03/28/2024 16:19     Time coordinating discharge: Over 30 minutes    Alm Apo, MD  [facility] 04/05/2024,  1:05 PM

## 2024-04-05 NOTE — Plan of Care (Signed)

## 2024-04-05 NOTE — Progress Notes (Addendum)
 1324- Tried to call report, receptionist transferred the call and waited on the line for 5 minutes. Was not able to call report at this time, will try again later   1556- Gave report to [place], RN and all questions were answered. Pt left with all belongings and paperwork

## 2024-04-14 ENCOUNTER — Inpatient Hospital Stay (HOSPITAL_COMMUNITY)
Admission: EM | Admit: 2024-04-14 | Discharge: 2024-04-23 | DRG: 444 | Disposition: A | Discharge status: Home Health | Source: Skilled Nursing Facility | Attending: Internal Medicine | Admitting: Internal Medicine

## 2024-04-14 ENCOUNTER — Emergency Department (HOSPITAL_COMMUNITY)

## 2024-04-14 ENCOUNTER — Encounter (HOSPITAL_COMMUNITY): Payer: Self-pay | Admitting: Pharmacy Technician

## 2024-04-14 ENCOUNTER — Other Ambulatory Visit: Payer: Self-pay

## 2024-04-14 DIAGNOSIS — Z8249 Family history of ischemic heart disease and other diseases of the circulatory system: Secondary | ICD-10-CM | POA: Diagnosis not present

## 2024-04-14 DIAGNOSIS — K5900 Constipation, unspecified: Secondary | ICD-10-CM | POA: Diagnosis present

## 2024-04-14 DIAGNOSIS — Z79899 Other long term (current) drug therapy: Secondary | ICD-10-CM

## 2024-04-14 DIAGNOSIS — Z7982 Long term (current) use of aspirin: Secondary | ICD-10-CM

## 2024-04-14 DIAGNOSIS — Z993 Dependence on wheelchair: Secondary | ICD-10-CM | POA: Diagnosis not present

## 2024-04-14 DIAGNOSIS — Z955 Presence of coronary angioplasty implant and graft: Secondary | ICD-10-CM | POA: Diagnosis not present

## 2024-04-14 DIAGNOSIS — K219 Gastro-esophageal reflux disease without esophagitis: Secondary | ICD-10-CM | POA: Diagnosis present

## 2024-04-14 DIAGNOSIS — K819 Cholecystitis, unspecified: Secondary | ICD-10-CM | POA: Diagnosis not present

## 2024-04-14 DIAGNOSIS — Z1152 Encounter for screening for COVID-19: Secondary | ICD-10-CM | POA: Diagnosis not present

## 2024-04-14 DIAGNOSIS — E785 Hyperlipidemia, unspecified: Secondary | ICD-10-CM | POA: Diagnosis present

## 2024-04-14 DIAGNOSIS — Z5971 Insufficient health insurance coverage: Secondary | ICD-10-CM

## 2024-04-14 DIAGNOSIS — N400 Enlarged prostate without lower urinary tract symptoms: Secondary | ICD-10-CM | POA: Diagnosis present

## 2024-04-14 DIAGNOSIS — Z808 Family history of malignant neoplasm of other organs or systems: Secondary | ICD-10-CM

## 2024-04-14 DIAGNOSIS — H543 Unqualified visual loss, both eyes: Secondary | ICD-10-CM | POA: Diagnosis present

## 2024-04-14 DIAGNOSIS — R7401 Elevation of levels of liver transaminase levels: Secondary | ICD-10-CM | POA: Diagnosis present

## 2024-04-14 DIAGNOSIS — E039 Hypothyroidism, unspecified: Secondary | ICD-10-CM | POA: Diagnosis present

## 2024-04-14 DIAGNOSIS — I2510 Atherosclerotic heart disease of native coronary artery without angina pectoris: Secondary | ICD-10-CM | POA: Diagnosis present

## 2024-04-14 DIAGNOSIS — R509 Fever, unspecified: Secondary | ICD-10-CM | POA: Diagnosis present

## 2024-04-14 DIAGNOSIS — I5032 Chronic diastolic (congestive) heart failure: Secondary | ICD-10-CM | POA: Diagnosis present

## 2024-04-14 DIAGNOSIS — E860 Dehydration: Secondary | ICD-10-CM | POA: Diagnosis present

## 2024-04-14 DIAGNOSIS — K838 Other specified diseases of biliary tract: Secondary | ICD-10-CM

## 2024-04-14 DIAGNOSIS — K8000 Calculus of gallbladder with acute cholecystitis without obstruction: Principal | ICD-10-CM | POA: Diagnosis present

## 2024-04-14 DIAGNOSIS — K810 Acute cholecystitis: Secondary | ICD-10-CM | POA: Diagnosis present

## 2024-04-14 DIAGNOSIS — J189 Pneumonia, unspecified organism: Secondary | ICD-10-CM | POA: Diagnosis present

## 2024-04-14 DIAGNOSIS — Z888 Allergy status to other drugs, medicaments and biological substances status: Secondary | ICD-10-CM

## 2024-04-14 DIAGNOSIS — I25708 Atherosclerosis of coronary artery bypass graft(s), unspecified, with other forms of angina pectoris: Secondary | ICD-10-CM

## 2024-04-14 DIAGNOSIS — I10 Essential (primary) hypertension: Secondary | ICD-10-CM | POA: Diagnosis not present

## 2024-04-14 DIAGNOSIS — I110 Hypertensive heart disease with heart failure: Secondary | ICD-10-CM | POA: Diagnosis present

## 2024-04-14 DIAGNOSIS — Z79890 Hormone replacement therapy (postmenopausal): Secondary | ICD-10-CM | POA: Diagnosis not present

## 2024-04-14 DIAGNOSIS — I252 Old myocardial infarction: Secondary | ICD-10-CM | POA: Diagnosis not present

## 2024-04-14 DIAGNOSIS — Z8051 Family history of malignant neoplasm of kidney: Secondary | ICD-10-CM | POA: Diagnosis not present

## 2024-04-14 DIAGNOSIS — I509 Heart failure, unspecified: Secondary | ICD-10-CM | POA: Diagnosis not present

## 2024-04-14 DIAGNOSIS — I25810 Atherosclerosis of coronary artery bypass graft(s) without angina pectoris: Secondary | ICD-10-CM | POA: Diagnosis present

## 2024-04-14 LAB — CBC WITH DIFFERENTIAL/PLATELET
Abs Immature Granulocytes: 0.02 K/uL (ref 0.00–0.07)
Basophils Absolute: 0 K/uL (ref 0.0–0.1)
Basophils Relative: 0 %
Eosinophils Absolute: 0 K/uL (ref 0.0–0.5)
Eosinophils Relative: 0 %
HCT: 41.1 % (ref 39.0–52.0)
Hemoglobin: 13.7 g/dL (ref 13.0–17.0)
Immature Granulocytes: 0 %
Lymphocytes Relative: 3 %
Lymphs Abs: 0.2 K/uL — ABNORMAL LOW (ref 0.7–4.0)
MCH: 31.8 pg (ref 26.0–34.0)
MCHC: 33.3 g/dL (ref 30.0–36.0)
MCV: 95.4 fL (ref 80.0–100.0)
Monocytes Absolute: 0.6 K/uL (ref 0.1–1.0)
Monocytes Relative: 7 %
Neutro Abs: 7.7 K/uL (ref 1.7–7.7)
Neutrophils Relative %: 90 %
Platelets: 159 K/uL (ref 150–400)
RBC: 4.31 MIL/uL (ref 4.22–5.81)
RDW: 11.6 % (ref 11.5–15.5)
WBC: 8.6 K/uL (ref 4.0–10.5)
nRBC: 0 % (ref 0.0–0.2)

## 2024-04-14 LAB — COMPREHENSIVE METABOLIC PANEL WITH GFR
ALT: 368 U/L — ABNORMAL HIGH (ref 0–44)
AST: 488 U/L — ABNORMAL HIGH (ref 15–41)
Albumin: 3.6 g/dL (ref 3.5–5.0)
Alkaline Phosphatase: 155 U/L — ABNORMAL HIGH (ref 38–126)
Anion gap: 11 (ref 5–15)
BUN: 20 mg/dL (ref 8–23)
CO2: 23 mmol/L (ref 22–32)
Calcium: 9.5 mg/dL (ref 8.9–10.3)
Chloride: 109 mmol/L (ref 98–111)
Creatinine, Ser: 0.87 mg/dL (ref 0.61–1.24)
GFR, Estimated: >60 mL/min (ref >60)
Glucose, Bld: 109 mg/dL — ABNORMAL HIGH (ref 70–99)
Potassium: 3.8 mmol/L (ref 3.5–5.1)
Sodium: 143 mmol/L (ref 135–145)
Total Bilirubin: 3.3 mg/dL — ABNORMAL HIGH (ref 0.0–1.2)
Total Protein: 7.1 g/dL (ref 6.5–8.1)

## 2024-04-14 LAB — RESP PANEL BY RT-PCR (RSV, FLU A&B, COVID)  RVPGX2
Influenza A by PCR: NEGATIVE
Influenza B by PCR: NEGATIVE
Resp Syncytial Virus by PCR: NEGATIVE
SARS Coronavirus 2 by RT PCR: NEGATIVE

## 2024-04-14 LAB — I-STAT CG4 LACTIC ACID, ED
Lactic Acid, Venous: 0.9 mmol/L (ref 0.5–1.9)
Lactic Acid, Venous: 0.7 mmol/L (ref 0.5–1.9)

## 2024-04-14 LAB — LIPASE, BLOOD: Lipase: 28 U/L (ref 11–51)

## 2024-04-14 LAB — BILIRUBIN, DIRECT: Bilirubin, Direct: 2.1 mg/dL — ABNORMAL HIGH (ref 0.0–0.2)

## 2024-04-14 LAB — ACETAMINOPHEN LEVEL: Acetaminophen (Tylenol), Serum: <10 ug/mL — ABNORMAL LOW (ref 10–30)

## 2024-04-14 LAB — CBG MONITORING, ED: Glucose-Capillary: 99 mg/dL (ref 70–99)

## 2024-04-14 LAB — PROTIME-INR
INR: 1.3 — ABNORMAL HIGH (ref 0.8–1.2)
Prothrombin Time: 16.4 s — ABNORMAL HIGH (ref 11.4–15.2)

## 2024-04-14 MED ORDER — LEVOTHYROXINE SODIUM 88 MCG PO TABS
88.0000 ug | ORAL_TABLET | Freq: Every day | ORAL | Status: DC
  Administered 2024-04-15 – 2024-04-17 (×3): 88 ug via ORAL
  Filled 2024-04-14 (×3): qty 1

## 2024-04-14 MED ORDER — PIPERACILLIN-TAZOBACTAM 3.375 G IVPB
3.3750 g | Freq: Three times a day (TID) | INTRAVENOUS | Status: DC
  Administered 2024-04-15 – 2024-04-22 (×19): 3.375 g via INTRAVENOUS
  Filled 2024-04-14 (×19): qty 50

## 2024-04-14 MED ORDER — FENTANYL CITRATE PF 50 MCG/ML IJ SOSY: 12.5000 ug | PREFILLED_SYRINGE | INTRAMUSCULAR | Status: DC | PRN

## 2024-04-14 MED ORDER — ACETAMINOPHEN 500 MG PO TABS
1000.0000 mg | ORAL_TABLET | Freq: Once | ORAL | Status: AC
  Administered 2024-04-14: 1000 mg via ORAL
  Filled 2024-04-14: qty 2

## 2024-04-14 MED ORDER — HYDROCODONE-ACETAMINOPHEN 5-325 MG PO TABS: 1.0000 | ORAL_TABLET | ORAL | Status: DC | PRN

## 2024-04-14 MED ORDER — PIPERACILLIN-TAZOBACTAM 3.375 G IVPB 30 MIN
3.3750 g | Freq: Once | INTRAVENOUS | Status: AC
  Administered 2024-04-14: 3.375 g via INTRAVENOUS
  Filled 2024-04-14: qty 50

## 2024-04-14 MED ORDER — TAMSULOSIN HCL 0.4 MG PO CAPS
0.8000 mg | ORAL_CAPSULE | Freq: Every day | ORAL | Status: DC
  Administered 2024-04-14 – 2024-04-23 (×10): 0.8 mg via ORAL
  Filled 2024-04-14 (×10): qty 2

## 2024-04-14 MED ORDER — ACETAMINOPHEN 650 MG RE SUPP: 650.0000 mg | Freq: Four times a day (QID) | RECTAL | Status: DC | PRN

## 2024-04-14 MED ORDER — ONDANSETRON HCL 4 MG PO TABS
4.0000 mg | ORAL_TABLET | Freq: Four times a day (QID) | ORAL | Status: DC | PRN
Start: 2024-04-14 — End: 2024-04-23

## 2024-04-14 MED ORDER — METOPROLOL SUCCINATE ER 25 MG PO TB24
25.0000 mg | ORAL_TABLET | Freq: Every day | ORAL | Status: DC
  Administered 2024-04-14 – 2024-04-19 (×6): 25 mg via ORAL
  Filled 2024-04-14 (×8): qty 1

## 2024-04-14 MED ORDER — ISOSORBIDE MONONITRATE ER 30 MG PO TB24
30.0000 mg | ORAL_TABLET | Freq: Every day | ORAL | Status: DC
  Administered 2024-04-14 – 2024-04-23 (×10): 30 mg via ORAL
  Filled 2024-04-14 (×10): qty 1

## 2024-04-14 MED ORDER — BISACODYL 10 MG RE SUPP: 10.0000 mg | Freq: Every day | RECTAL | Status: DC | PRN

## 2024-04-14 MED ORDER — SODIUM CHLORIDE 0.9 % IV SOLN: INTRAVENOUS | Status: AC

## 2024-04-14 MED ORDER — IOHEXOL 350 MG/ML SOLN
65.0000 mL | Freq: Once | INTRAVENOUS | Status: AC | PRN
  Administered 2024-04-14: 65 mL via INTRAVENOUS

## 2024-04-14 MED ORDER — ACETAMINOPHEN 325 MG PO TABS
650.0000 mg | ORAL_TABLET | Freq: Four times a day (QID) | ORAL | Status: DC | PRN
  Administered 2024-04-17: 650 mg via ORAL
  Filled 2024-04-14: qty 2

## 2024-04-14 MED ORDER — ONDANSETRON HCL 4 MG/2ML IJ SOLN: 4.0000 mg | Freq: Four times a day (QID) | INTRAMUSCULAR | Status: DC | PRN

## 2024-04-14 MED ORDER — SODIUM CHLORIDE 0.9 % IV SOLN
500.0000 mg | INTRAVENOUS | Status: DC
  Administered 2024-04-15 – 2024-04-18 (×4): 500 mg via INTRAVENOUS
  Filled 2024-04-14 (×5): qty 5

## 2024-04-14 MED ORDER — INSULIN ASPART 100 UNIT/ML IJ SOLN: 0.0000 [IU] | INTRAMUSCULAR | Status: DC

## 2024-04-14 NOTE — Assessment & Plan Note (Signed)
 Resume Flomax

## 2024-04-14 NOTE — Assessment & Plan Note (Signed)
-   Check TSH continue home medications Synthroid at 88mcg po q day  

## 2024-04-14 NOTE — ED Triage Notes (Signed)
 Pt bib ems from [facility] with increased fatigue. 100.21F, refused tylenol  with ems Pt blind at baseline and non ambulatory.  Alert and oriented X4

## 2024-04-14 NOTE — ED Provider Notes (Signed)
 [place] [facility] DE[facility]MENT AT [facility] Provider Note  CSN: [id number] Arrival date & time: 04/14/24 1334  Chief Complaint(s) Fatigue and Fever  HPI Dustin Hess is a 78 y.o. male This is a 78 year old male who presents emergency room today due to fever and fatigue.  Patient with a history of blindness, coronary artery disease, hypertension, hypothyroidism, anemia.  History obtained via EMS as well as recent hospital discharge summary.  At baseline, patient needs bilateral upper extremity support using rail per recent physical therapy note.  Patient states he had been feeling fine, has not had any cough.  He says that he had a little bit of abdominal pain yesterday.  No vomiting, no diarrhea.   Past Medical History Past Medical History:  Diagnosis Date   Anemia, unspecified 12/22/2013   Arthritis    Benign prostatic hypertrophy    Congenital blindness    Coronary artery disease    a. 06/2004 Inf STEMI with PCI/BMS to RCA/LCX;  b. Cath 2014 40-50% focal bradycardia circ stenosis with a patent stent, RCA 50% stenosis, distal 60% stenosis in a previously stented area. 06/2011 Neg MV, EF 67%.   Dyslipidemia    GERD (gastroesophageal reflux disease)    GI bleeding 2006   Hypertension    Hypothyroidism    Leukopenia 12/22/2013   Patient Active Problem List   Diagnosis Date Noted   Physical deconditioning 03/31/2024   Dyslipidemia 08/01/2019   Educated about COVID-19 virus infection 01/19/2019   CAD (coronary artery disease) of artery bypass graft 11/08/2015   Leukopenia 12/22/2013   Thrombocytopenia, unspecified (HCC) 12/22/2013   Blindness, congenital 04/28/2013   GERD (gastroesophageal reflux disease) 04/28/2013   Benign prostatic hyperplasia 04/28/2013   Hypothyroidism 12/13/2008   HYPERLIPIDEMIA 12/13/2008   Essential hypertension, benign 12/13/2008   Coronary atherosclerosis 12/13/2008   Home Medication(s) Prior to Admission medications    Medication Sig Start Date End Date Taking? Authorizing Provider  acetaminophen  (TYLENOL ) 650 MG CR tablet Take 650 mg by mouth every 8 (eight) hours as needed for pain.    [provider]  aspirin  81 MG tablet Take 81 mg by mouth daily.      [provider]  atorvastatin  (LIPITOR) 20 MG tablet Take 20 mg by mouth daily. 01/20/24   [provider]  Cholecalciferol (VITAMIN D3) 1000 UNITS CAPS Take 1 capsule by mouth daily.      [provider]  dutasteride  (AVODART ) 0.5 MG capsule Take 0.5 mg by mouth daily.      [provider]  ferrous sulfate  325 (65 FE) MG EC tablet Take 1 tablet (325 mg total) by mouth daily with breakfast. 04/05/24 04/05/25  Patsy Lenis, MD  isosorbide  mononitrate (IMDUR ) 30 MG 24 hr tablet Take 1 tablet (30 mg total) by mouth daily. 04/05/24   Patsy Lenis, MD  levothyroxine  (SYNTHROID ) 88 MCG tablet Take 88 mcg by mouth daily before breakfast. 03/02/24   [provider]  metoprolol  succinate (TOPROL -XL) 25 MG 24 hr tablet Take 1 tablet (25 mg total) by mouth daily. TAKE WITH OR IMMEDIATELY FOLLOWING A MEAL. 04/05/24   Patsy Lenis, MD  Multiple Vitamin (MULTIVITAMIN) tablet Take 1 tablet by mouth daily.    [provider]  nitroGLYCERIN  (NITROSTAT ) 0.4 MG SL tablet Place 1 tablet (0.4 mg total) under the tongue every 5 (five) minutes as needed. Must call and schedule appt for future refills 09/11/23   Lavona Agent, MD  senna-docusate (SENOKOT-S) 8.6-50 MG tablet Take 1 tablet by  mouth daily. 04/05/24   Patsy Lenis, MD  tamsulosin  (FLOMAX ) 0.4 MG CAPS capsule Take 0.8 mg by mouth daily.    [provider]                                                                                                                                    Past Surgical History Past Surgical History:  Procedure Laterality Date   COLONOSCOPY     CORONARY ANGIOPLASTY WITH STENT PLACEMENT     ESOPHAGOGASTRODUODENOSCOPY      HEMORRHOID SURGERY     LEFT HEART CATHETERIZATION WITH CORONARY ANGIOGRAM N/A 04/30/2013   Procedure: LEFT HEART CATHETERIZATION WITH CORONARY ANGIOGRAM;  Surgeon: Ozell Fell, MD;  Location: [facility] CATH LAB;  Service: Cardiovascular;  Laterality: N/A;   Family History Family History  Problem Relation Age of Onset   Cancer Mother 57       kidney   Cancer Father 42       blood cancer   Heart attack Cousin        died in her 55's.    Social History Social History   Tobacco Use   Smoking status: Never   Smokeless tobacco: Never  Vaping Use   Vaping status: Never Used  Substance Use Topics   Alcohol use: No   Drug use: No   Allergies Benadryl [diphenhydramine hcl]  Review of Systems Review of Systems  Physical Exam Vital Signs  I have reviewed the triage vital signs BP (!) 180/92   Pulse 93   Temp 98.8 F (37.1 C) (Oral)   Resp 16   Ht 5' 3 (1.6 m)   Wt 64.4 kg   SpO2 98%   BMI 25.15 kg/m   Physical Exam Vitals and nursing note reviewed.  HENT:     Head: Normocephalic.  Eyes:     Pupils: Pupils are equal, round, and reactive to light.  Cardiovascular:     Rate and Rhythm: Normal rate.     Heart sounds: Murmur heard.  Pulmonary:     Effort: Pulmonary effort is normal.  Abdominal:     General: Abdomen is flat. There is no distension.     Palpations: Abdomen is soft.     Tenderness: There is no abdominal tenderness.  Musculoskeletal:        General: No swelling or deformity. Normal range of motion.     Right lower leg: No edema.     Left lower leg: No edema.  Skin:    General: Skin is warm and dry.  Neurological:     General: No focal deficit present.     Mental Status: He is alert and oriented to person, place, and time.     ED Results and Treatments Labs (all labs ordered are listed, but only abnormal results are displayed) Labs Reviewed  COMPREHENSIVE METABOLIC PANEL WITH GFR - Abnormal; Notable for the following components:  Result Value    Glucose, Bld 109 (*)    AST 488 (*)    ALT 368 (*)    Alkaline Phosphatase 155 (*)    Total Bilirubin 3.3 (*)    All other components within normal limits  CBC WITH DIFFERENTIAL/PLATELET - Abnormal; Notable for the following components:   Lymphs Abs 0.2 (*)    All other components within normal limits  PROTIME-INR - Abnormal; Notable for the following components:   Prothrombin Time 16.4 (*)    INR 1.3 (*)    All other components within normal limits  ACETAMINOPHEN  LEVEL - Abnormal; Notable for the following components:   Acetaminophen  (Tylenol ), Serum <10 (*)    All other components within normal limits  RESP PANEL BY RT-PCR (RSV, FLU A&B, COVID)  RVPGX2  CULTURE, BLOOD (ROUTINE X 2)  CULTURE, BLOOD (ROUTINE X 2)  URINALYSIS, W/ REFLEX TO CULTURE (INFECTION SUSPECTED)  BILIRUBIN, DIRECT  LIPASE, BLOOD  CBC  COMPREHENSIVE METABOLIC PANEL WITH GFR  I-STAT CG4 LACTIC ACID, ED  I-STAT CG4 LACTIC ACID, ED                                                                                                                          Radiology US  Abdomen Limited RUQ (LIVER/GB) Result Date: 04/14/2024 CLINICAL DATA:  Cholecystitis EXAM: ULTRASOUND ABDOMEN LIMITED RIGHT UPPER QUADRANT COMPARISON:  Ultrasound 03/29/2024.  CT 04/14/2024 earlier FINDINGS: Gallbladder: Dilated gallbladder with significant wall thickening, new from previous. Dependent stones and tumefactive sludge. No reported Murphy's sign reported however the patient was not awake. Wall thickness approaches 7 mm. Common bile duct: Diameter: 2 mm Liver: No focal lesion identified. Within normal limits in parenchymal echogenicity. Portal vein is patent on color Doppler imaging with normal direction of blood flow towards the liver. Other: None. IMPRESSION: New gallbladder wall thickening and edema. Stones and sludge identified. Please correlate for other clinical findings of acute cholecystitis. Confirmatory HIDA scan could be considered  as clinically appropriate. No biliary ductal dilatation. Electronically Signed   By: Ranell Bring M.D.   On: 04/14/2024 18:23   CT ABDOMEN PELVIS W CONTRAST Result Date: 04/14/2024 CLINICAL DATA:  Fever. Fatigue. Previous history of recent hospital stay for sepsis and dehydration. Abnormal liver function tests EXAM: CT ABDOMEN AND PELVIS WITH CONTRAST TECHNIQUE: Multidetector CT imaging of the abdomen and pelvis was performed using the standard protocol following bolus administration of intravenous contrast. RADIATION DOSE REDUCTION: This exam was performed according to the departmental dose-optimization program which includes automated exposure control, adjustment of the mA and/or kV according to patient size and/or use of iterative reconstruction technique. CONTRAST:  65mL OMNIPAQUE  IOHEXOL  350 MG/ML SOLN COMPARISON:  Ultrasound abdomen 03/29/2024. Renal stone CT 03/28/2024. Recent swallowing study of 04/05/2024 FINDINGS: Lower chest: Mild opacity along the lung bases. Acute infiltrate is possible. Recommend follow-up. No pleural effusion. Breathing motion. Scattered vascular calcifications including along the coronary arteries. Hepatobiliary: Dilated gallbladder with wall thickening and wall edema. Stones are  identified. Please correlate for other clinical evidence of acute cholecystitis. No space-occupying liver lesion. Patent portal vein. Pancreas: Mild global atrophy of the pancreas. No obvious enhancing mass. Spleen: Spleen is nonenlarged. Adrenals/Urinary Tract: Minimal thickening of the lateral limb of the right adrenal gland, nonspecific. Left adrenal glands preserved. Global bilateral renal atrophy. Upper pole Bosniak 1 right-sided small renal cyst. There are bilateral nonobstructing renal stones. These were seen previously. Example on the right measures 10 mm and left 8 mm. Other foci identified as well. Ureters have normal course and caliber extending down to the urinary bladder. Preserved contour to  the urinary bladder. Slightly prominent prostate. Stomach/Bowel: There is contrast seen throughout the colon, likely did to previous swallowing study but please correlate with history. Large bowel is nondilated. Scattered colonic stool. Normal retrocecal appendix. The stomach has some luminal fluid. Small bowel is nondilated. Vascular/Lymphatic: Normal caliber IVC. Diffuse vascular calcifications identified including along the aorta and iliac vessels. 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 Slightly saccular in appearance. Second smaller area more caudal as well with what may be a non flow-limiting short segment dissection. Please correlate with history. Simple attention on follow-up. Reproductive: Heterogeneous enlarged prostate. Please correlate with the BPH changes and patient's PSA. Other: No free air or free fluid. Musculoskeletal: Scattered degenerative changes. Multilevel Schmorl's node deformities. Posterior multilevel disc bulging and posterior osteophytes. IMPRESSION: New mild areas of opacity along the lung bases, right greater left. Subtle infiltrate is possible. Recommend follow-up. Dilated gallbladder with slight wall thickening and some wall edema, new from previous. There are stones as well. Please correlate for any clinical evidence of acute cholecystitis and if needed additional workup such as HIDA scan. No bowel obstruction. Contrast seen along the course of the colon. Normal appendix. Bilateral nonobstructing renal stones. Mild ectasia of the abdominal aorta with some saccular areas. Simple follow up surveillance. Electronically Signed   By: Ranell Bring M.D.   On: 04/14/2024 17:28   DG Chest 2 View Result Date: 04/14/2024 CLINICAL DATA:  Sepsis. EXAM: CHEST - 2 VIEW COMPARISON:  X-ray 03/28/2024. FINDINGS: Underinflation. There is some linear opacity lung bases likely scar or atelectasis. No consolidation, pneumothorax or effusion. No edema.  Normal cardiopericardial silhouette. Calcified aorta. Degenerative changes along the spine. There are loops of bowel in the abdomen which have some contrast. Please correlate with known history and prior fluoroscopy. IMPRESSION: Underinflation.  Basilar atelectasis. Electronically Signed   By: Ranell Bring M.D.   On: 04/14/2024 15:25    Pertinent labs & imaging results that were available during my care of the patient were reviewed by me and considered in my medical decision making (see MDM for details).  Medications Ordered in ED Medications  acetaminophen  (TYLENOL ) tablet 1,000 mg (1,000 mg Oral Given 04/14/24 1455)  iohexol  (OMNIPAQUE ) 350 MG/ML injection 65 mL (65 mLs Intravenous Contrast Given 04/14/24 1718)  piperacillin -tazobactam (ZOSYN ) IVPB 3.375 g (0 g Intravenous Stopped 04/14/24 1913)  Procedures Procedures  (including critical care time)  Medical Decision Making / ED Course   This patient presents to the ED for concern of fever, this involves an extensive number of treatment options, and is a complaint that carries with it a high risk of complications and morbidity.  The differential diagnosis includes viral syndrome, underlying infection, pneumonia, intra-abdominal infection, less likely cellulitis, less likely meningitis.  MDM: Patient overall well-appearing.  Temp only 100.6 at skilled nursing facility.  No fever today.  And oriented x 3.  Endorses bit of abdominal pain yesterday, however his abdomen is soft on exam.  Will obtain imaging.  Blood work ordered.  Viral panel and chest x-ray ordered.  Reassessment 7 PM-patient's labs show elevations in his LFTs, alk phos.  CT imaging shows question of cholecystitis.  Reevaluated the patient's abdomen, he continues to have no tenderness in his right upper quadrant.  Reached out to general surgery, Dr.  Ann, who came to evaluate the patient.  She was recommending HIDA scan for the patient given his medical comorbidities.  Debride patient with a dose of Zosyn  in the event that this was a developing cholecystitis.  Will admit patient to hospitalist for HIDA scan, observation.   Additional history obtained: -Additional history obtained from EMS -External records from outside source obtained and reviewed including: Chart review including previous notes, labs, imaging, consultation notes   Lab Tests: -I ordered, reviewed, and interpreted labs.   The pertinent results include:   Labs Reviewed  COMPREHENSIVE METABOLIC PANEL WITH GFR - Abnormal; Notable for the following components:      Result Value   Glucose, Bld 109 (*)    AST 488 (*)    ALT 368 (*)    Alkaline Phosphatase 155 (*)    Total Bilirubin 3.3 (*)    All other components within normal limits  CBC WITH DIFFERENTIAL/PLATELET - Abnormal; Notable for the following components:   Lymphs Abs 0.2 (*)    All other components within normal limits  PROTIME-INR - Abnormal; Notable for the following components:   Prothrombin Time 16.4 (*)    INR 1.3 (*)    All other components within normal limits  ACETAMINOPHEN  LEVEL - Abnormal; Notable for the following components:   Acetaminophen  (Tylenol ), Serum <10 (*)    All other components within normal limits  RESP PANEL BY RT-PCR (RSV, FLU A&B, COVID)  RVPGX2  CULTURE, BLOOD (ROUTINE X 2)  CULTURE, BLOOD (ROUTINE X 2)  URINALYSIS, W/ REFLEX TO CULTURE (INFECTION SUSPECTED)  BILIRUBIN, DIRECT  LIPASE, BLOOD  CBC  COMPREHENSIVE METABOLIC PANEL WITH GFR  I-STAT CG4 LACTIC ACID, ED  I-STAT CG4 LACTIC ACID, ED     Imaging Studies ordered: I ordered imaging studies including CT abdomen and pelvis I independently visualized and interpreted imaging. I agree with the radiologist interpretation   Medicines ordered and prescription drug management: Meds ordered this encounter   Medications   acetaminophen  (TYLENOL ) tablet 1,000 mg   iohexol  (OMNIPAQUE ) 350 MG/ML injection 65 mL   piperacillin -tazobactam (ZOSYN ) IVPB 3.375 g    Antibiotic Indication::   Intra-abdominal Infection    -I have reviewed the patients home medicines and have made adjustments as needed  Cardiac Monitoring: The patient was maintained on a cardiac monitor.  I personally viewed and interpreted the cardiac monitored which showed an underlying rhythm of: Normal sinus rhythm  Social Determinants of Health:  Factors impacting patients care include: Lack of access to primary care, nursing home resident   Reevaluation: After  the interventions noted above, I reevaluated the patient and found that they have :improved  Co morbidities that complicate the patient evaluation  Past Medical History:  Diagnosis Date   Anemia, unspecified 12/22/2013   Arthritis    Benign prostatic hypertrophy    Congenital blindness    Coronary artery disease    a. 06/2004 Inf STEMI with PCI/BMS to RCA/LCX;  b. Cath 2014 40-50% focal bradycardia circ stenosis with a patent stent, RCA 50% stenosis, distal 60% stenosis in a previously stented area. 06/2011 Neg MV, EF 67%.   Dyslipidemia    GERD (gastroesophageal reflux disease)    GI bleeding 2006   Hypertension    Hypothyroidism    Leukopenia 12/22/2013        Final Clinical Impression(s) / ED Diagnoses Final diagnoses:  Transaminitis  Biliary sludge     @PCDICTATION @    Mannie Pac T, DO 04/14/24 1926

## 2024-04-14 NOTE — Assessment & Plan Note (Signed)
- -  Patient presenting with   fever      and infiltrate in   lower lobe on chest x-ray -Infiltrate on CXR and 2-3 characteristics (fever, leukocytosis, purulent sputum) are consistent with pneumonia. -This appears to be most likely community-acquired pneumonia.     will admit for treatment of CAP will start on appropriate antibiotic coverage. -zosyn  azithromycin    Obtain:  sputum cultures,                influenza serologies negative                  COVID PCR negative                   blood cultures and sputum cultures ordered                   strep pneumo UA antigen,                    check for Legionella antigen.                Provide oxygen as needed.

## 2024-04-14 NOTE — Assessment & Plan Note (Signed)
 Imaging worrisome for acute cholecystitis appreciate general surgery consult Recommended HIDA scan If HIDA scan positive she will need cardiology consult for clearance N.p.o. at midnight

## 2024-04-14 NOTE — Consult Note (Addendum)
 Reason for Consult:  Concern for cholecystitis Referring Provider: Mannie, DO  HPI  Dustin Hess is an 78 y.o. male with concern for cholecystitis. PMH of blindness, CAD, HTN, hypothryoid and anemia.  Patient presented to ED today facility [facility]) due to increase fatigue and fever to 100.6 per report. Per patient he states he feels fine, no cough, no nausea or emesis, no diarrhea. He states he had some transient abdominal pain yesterday but that has resolved.  Workup notable for dilated gallbladder with some minimal wall thickening and edema as well as cholelithiasis. No leukocytosis. AST/ALT/AlkPhos 488/368/155 respectively and Tbili 3.3. US  showed GB wall thickening and edema with stones and sludge. CBD 2mm.  Of note he was recently hospitalized from 6/29-7/7 for lethargy, AKI and hypernatremia and discharged to SNF.  10 point review of systems is negative except as listed above in HPI.  Objective  Past Medical History: Past Medical History:  Diagnosis Date   Anemia, unspecified 12/22/2013   Arthritis    Benign prostatic hypertrophy    Congenital blindness    Coronary artery disease    a. 06/2004 Inf STEMI with PCI/BMS to RCA/LCX;  b. Cath 2014 40-50% focal bradycardia circ stenosis with a patent stent, RCA 50% stenosis, distal 60% stenosis in a previously stented area. 06/2011 Neg MV, EF 67%.   Dyslipidemia    GERD (gastroesophageal reflux disease)    GI bleeding 2006   Hypertension    Hypothyroidism    Leukopenia 12/22/2013    Past Surgical History: Past Surgical History:  Procedure Laterality Date   COLONOSCOPY     CORONARY ANGIOPLASTY WITH STENT PLACEMENT     ESOPHAGOGASTRODUODENOSCOPY     HEMORRHOID SURGERY     LEFT HEART CATHETERIZATION WITH CORONARY ANGIOGRAM N/A 04/30/2013   Procedure: LEFT HEART CATHETERIZATION WITH CORONARY ANGIOGRAM;  Surgeon: Ozell Fell, MD;  Location: [facility] CATH LAB;  Service: Cardiovascular;  Laterality: N/A;    Family  History:  Family History  Problem Relation Age of Onset   Cancer Mother 37       kidney   Cancer Father 26       blood cancer   Heart attack Cousin        died in her 58's.    Social History:  reports that he has never smoked. He has never used smokeless tobacco. He reports that he does not drink alcohol and does not use drugs.  Allergies:  Allergies  Allergen Reactions   Benadryl [Diphenhydramine Hcl] Palpitations    2014  Took one tablet and had heart palpitations    Medications: I have reviewed the patient's current medications.  Labs: I have personally reviewed all labs for the past 24h  Imaging: I have personally reviewed and interpreted all imaging for the past 24h and agree with the radiologist's impression.  CT ABDOMEN PELVIS W CONTRAST Result Date: 04/14/2024 CLINICAL DATA:  Fever. Fatigue. Previous history of recent hospital stay for sepsis and dehydration. Abnormal liver function tests EXAM: CT ABDOMEN AND PELVIS WITH CONTRAST TECHNIQUE: Multidetector CT imaging of the abdomen and pelvis was performed using the standard protocol following bolus administration of intravenous contrast. RADIATION DOSE REDUCTION: This exam was performed according to the departmental dose-optimization program which includes automated exposure control, adjustment of the mA and/or kV according to patient size and/or use of iterative reconstruction technique. CONTRAST:  65mL OMNIPAQUE  IOHEXOL  350 MG/ML SOLN COMPARISON:  Ultrasound abdomen 03/29/2024. Renal stone CT 03/28/2024. Recent swallowing study of 04/05/2024 FINDINGS: Lower  chest: Mild opacity along the lung bases. Acute infiltrate is possible. Recommend follow-up. No pleural effusion. Breathing motion. Scattered vascular calcifications including along the coronary arteries. Hepatobiliary: Dilated gallbladder with wall thickening and wall edema. Stones are identified. Please correlate for other clinical evidence of acute cholecystitis. No  space-occupying liver lesion. Patent portal vein. Pancreas: Mild global atrophy of the pancreas. No obvious enhancing mass. Spleen: Spleen is nonenlarged. Adrenals/Urinary Tract: Minimal thickening of the lateral limb of the right adrenal gland, nonspecific. Left adrenal glands preserved. Global bilateral renal atrophy. Upper pole Bosniak 1 right-sided small renal cyst. There are bilateral nonobstructing renal stones. These were seen previously. Example on the right measures 10 mm and left 8 mm. Other foci identified as well. Ureters have normal course and caliber extending down to the urinary bladder. Preserved contour to the urinary bladder. Slightly prominent prostate. Stomach/Bowel: There is contrast seen throughout the colon, likely did to previous swallowing study but please correlate with history. Large bowel is nondilated. Scattered colonic stool. Normal retrocecal appendix. The stomach has some luminal fluid. Small bowel is nondilated. Vascular/Lymphatic: Normal caliber IVC. Diffuse vascular calcifications identified including along the aorta and iliac vessels. 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 Slightly saccular in appearance. Second smaller area more caudal as well with what may be a non flow-limiting short segment dissection. Please correlate with history. Simple attention on follow-up. Reproductive: Heterogeneous enlarged prostate. Please correlate with the BPH changes and patient's PSA. Other: No free air or free fluid. Musculoskeletal: Scattered degenerative changes. Multilevel Schmorl's node deformities. Posterior multilevel disc bulging and posterior osteophytes. IMPRESSION: New mild areas of opacity along the lung bases, right greater left. Subtle infiltrate is possible. Recommend follow-up. Dilated gallbladder with slight wall thickening and some wall edema, new from previous. There are stones as well. Please correlate for any clinical evidence  of acute cholecystitis and if needed additional workup such as HIDA scan. No bowel obstruction. Contrast seen along the course of the colon. Normal appendix. Bilateral nonobstructing renal stones. Mild ectasia of the abdominal aorta with some saccular areas. Simple follow up surveillance. Electronically Signed   By: Ranell Bring M.D.   On: 04/14/2024 17:28   DG Chest 2 View Result Date: 04/14/2024 CLINICAL DATA:  Sepsis. EXAM: CHEST - 2 VIEW COMPARISON:  X-ray 03/28/2024. FINDINGS: Underinflation. There is some linear opacity lung bases likely scar or atelectasis. No consolidation, pneumothorax or effusion. No edema. Normal cardiopericardial silhouette. Calcified aorta. Degenerative changes along the spine. There are loops of bowel in the abdomen which have some contrast. Please correlate with known history and prior fluoroscopy. IMPRESSION: Underinflation.  Basilar atelectasis. Electronically Signed   By: Ranell Bring M.D.   On: 04/14/2024 15:25   CT Renal Stone Study 03/19/24:  Cholelithiasis without cholecystitis, bilateral nonobstructing intrarenal calculi, mild left colonic diverticulosis.  RUQ US  03/29/24: Cholelithiasis and gallbladder sludge, CBD at upper limits of normal (6-21mm) without filling defect..  Physical Exam Blood pressure (!) 154/96, pulse 90, temperature 98.9 F (37.2 C), temperature source Oral, resp. rate 18, height 5' 3 (1.6 m), weight 64.4 kg, SpO2 100%. General: no acute distress HEENT: normocephalic, atraumatic, congenital blindness Oropharynx: mucous membranes moist CV: Regular rate and rhythm, hypertensive Chest: equal chest rise bilaterally normal respiratory effort on room air Abdomen: soft, nondistended, nontender Extremities: moves all extremities Skin: warm, dry, no rashes Psych: normal memory, normal mood/affect  Neuro: Alert and oriented    Assessment   [employer]  A Levene is an 78 y.o. male who presented to [facility] from facility for fevers and fatigue,  imaging concerning for dilated gallbladder with wall thickening and elevated LFTs.  Plan  - Recommend medicine admission given age and comorbidities - Repeat AM CBC and LFTs - Agree with empiric antibiotics - Recommend UA to rule out other sources of infection - Given patient has no leukocytosis and no abdominal tenderness, recommend HIDA to better evaluate if cystic duct patent or obstructed consistent with cholecystitis. - If HIDA positive, will need to engage cardiology for risk stratification/optimization. If HIDA negative then given LFT elevation and smaller caliber CBD this evening from prior, patient may have recently passed stone and will need to have discussion regarding cholecystectomy this hospitalization if optimized versus outpatient. - Ok for CLD and NPO at midnight - DVT - SCDs, ok for DVT ppx from surgical perspective   I reviewed ED provider notes, last 24 h vitals and pain scores, last 48 h intake and output, last 24 h labs and trends, and last 24 h imaging results.  I personally reviewed recent discharge summary, cardiology notes from 2024, RUQ US  from 03/29/24, and CT Renal Stone study from 03/28/24.  This care required high  level of medical decision making.    Orie Silversmith, MD [facility] Surgery

## 2024-04-14 NOTE — Assessment & Plan Note (Signed)
 Hold aspirin  as may need surgical intervention hold Lipitor given elevated LFTs continue Imdur  if blood pressure allows

## 2024-04-14 NOTE — Assessment & Plan Note (Signed)
 Continue Imdur  and Toprol 

## 2024-04-14 NOTE — ED Notes (Signed)
 Sister on campus, would like to sit w/pt when pt is out of EMS triage. Sister Sherrilyn [phone]

## 2024-04-14 NOTE — ED Notes (Signed)
 Unable to get labs, stuck pt twice

## 2024-04-14 NOTE — Assessment & Plan Note (Signed)
Hold Lipitor given elevated LFTs

## 2024-04-14 NOTE — Assessment & Plan Note (Signed)
 Presumed cholecystitis HIDA scan pending appreciate general surgery consult continue Zosyn  continue to follow FTs

## 2024-04-14 NOTE — ED Provider Triage Note (Signed)
 Emergency Medicine Provider Triage Evaluation Note  Dustin Hess , a 78 y.o. male  was evaluated in triage.  Pt complains of fever and fatigue.  D/c on 7/7 for an adm for sepsis and dehydration.  Review of Systems  Positive: Fever, fatigue Negative: pain  Physical Exam  There were no vitals taken for this visit. Gen:   Awake Resp:  Normal effort  MSK:   Moves extremities without difficulty  Other:    Medical Decision Making  Medically screening exam initiated at 1:49 PM.  Appropriate orders placed.  Jerilynn DELENA Lowes was informed that the remainder of the evaluation will be completed by another provider, this initial triage assessment does not replace that evaluation, and the importance of remaining in the ED until their evaluation is complete.  Sepsis orders placed.  He will need a room.   Dean Clarity, MD 04/14/24 1350

## 2024-04-14 NOTE — H&P (Signed)
 Dustin Hess FMW:[id number] DOB: [birth year] DOA: 04/14/2024     PCP: [name], MD   Outpatient Specialists:   CARDS:   Dr. Lynwood Schilling, MD   Patient arrived to ER on 04/14/24 at 1334 Referred by Attending Mannie Fairy DASEN, DO   Patient coming from:    From facility   [facility]  [facility]  Chief Complaint:   Chief Complaint  Patient presents with   Fatigue   Fever    HPI: Dustin Hess is a 78 y.o. male with medical history significant of CAD status post PCI in 2005, hypertension, hypothyroidism, blindness, anemia, BPH     Presented with    fever fatigue Patient presents from [facility] increased fatigue and low-grade fever 100.6 at baseline patient has blindness and nonambulant She denied any cough but does endorse some abdominal pain yesterday no nausea no vomiting or diarrhea CT abdomen pelvis showed possible infiltrates as well as dilated gallbladder with some wall edema recommended HIDA scan patient started on Zosyn  Discussed with general surgery Dr. Ann who seen patient in consult plan for HIDA scan    Now denies any abd pain  Denies significant ETOH intake   Does not smoke    Regarding pertinent Chronic problems:     Hyperlipidemia - on statins Lipitor (atorvastatin )  Lipid Panel     Component Value Date/Time   CHOL 125 05/02/2023 0935   TRIG 76 05/02/2023 0935   HDL 37 (L) 05/02/2023 0935   CHOLHDL 3.4 05/02/2023 0935   CHOLHDL 4 07/01/2012 1009   VLDL 15.0 07/01/2012 1009   LDLCALC 73 05/02/2023 0935   LABVLDL 15 05/02/2023 0935     HTN on isosorbide  metoprolol    chronic CHF diastolic/  - last echo  Recent Results (from the past 56199 hours)  ECHOCARDIOGRAM COMPLETE   Collection Time: 10/03/21  2:03 PM  Result Value   S' Lateral 2.60   Area-P 1/2 2.87   Narrative      ECHOCARDIOGRAM REPORT       IMPRESSIONS    1. Left ventricular ejection fraction, by estimation, is 55 to 60%. The left ventricle has normal  function. The left ventricle has no regional wall motion abnormalities. Left ventricular diastolic parameters are consistent with Grade I diastolic  dysfunction (impaired relaxation).  2. Right ventricular systolic function is normal. The right ventricular size is normal. Tricuspid regurgitation signal is inadequate for assessing PA pressure.  3. The mitral valve is normal in structure. No evidence of mitral valve regurgitation. No evidence of mitral stenosis.  4. The aortic valve is tricuspid. Aortic valve regurgitation is not visualized. Aortic valve sclerosis is present, with no evidence of aortic valve stenosis.  5. The inferior vena cava is normal in size with greater than 50% respiratory variability, suggesting right atrial pressure of 3 mmHg.           CAD  - On Aspirin , statin, betablocker,                 - followed by cardiology                   Hypothyroidism:   Lab Results  Component Value Date   TSH 1.683 03/28/2024   on synthroid       BPH - on Flomax ,       While in ER:   CT abdomen worrisome for cholecystitis as well as pulmonary infiltrate    Lab Orders  Culture, blood (Routine x 2)         Resp panel by RT-PCR (RSV, Flu A&B, Covid) Anterior Nasal Swab         Comprehensive metabolic panel         CBC with Differential         Protime-INR         Urinalysis, w/ Reflex to Culture (Infection Suspected) -Urine, Clean Catch         Acetaminophen  level         Bilirubin, direct         Lipase, blood         CBC         Comprehensive metabolic panel         I-Stat Lactic Acid, ED      CXR -Underinflation. Basilar atelectasis.   CTabd/pelvis -  New mild areas of opacity along the lung bases, right greater left. Subtle infiltrate is possible. Recommend follow-up.   Dilated gallbladder with slight wall thickening and some wall edema, new from previous. There are stones as well. Please correlate for any clinical evidence of acute cholecystitis and if  needed additional workup such as HIDA scan.   No bowel obstruction. Contrast seen along the course of the colon. Normal appendix.   Bilateral nonobstructing renal stones.   Ultrasound showing new gallbladder wall thickening and edema stones and sludge  Following Medications were ordered in ER: Medications  acetaminophen  (TYLENOL ) tablet 1,000 mg (1,000 mg Oral Given 04/14/24 1455)  iohexol  (OMNIPAQUE ) 350 MG/ML injection 65 mL (65 mLs Intravenous Contrast Given 04/14/24 1718)  piperacillin -tazobactam (ZOSYN ) IVPB 3.375 g (0 g Intravenous Stopped 04/14/24 1913)    _______________________________________________________ ER Provider Called:       General surgeryDr. Ann  They Recommend admit to medicine    SEEN in ER   ED Triage Vitals  Encounter Vitals Group     BP 04/14/24 1506 (!) 163/84     Girls Systolic BP Percentile --      Girls Diastolic BP Percentile --      Boys Systolic BP Percentile --      Boys Diastolic BP Percentile --      Pulse Rate 04/14/24 1506 76     Resp 04/14/24 1506 20     Temp 04/14/24 1520 98.9 F (37.2 C)     Temp Source 04/14/24 1520 Oral     SpO2 04/14/24 1506 100 %     Weight 04/14/24 1612 141 lb 15.6 oz (64.4 kg)     Height 04/14/24 1612 5' 3 (1.6 m)     Head Circumference --      Peak Flow --      Pain Score 04/14/24 1341 0     Pain Loc --      Pain Education --      Exclude from Growth Chart --   UFJK(75)@     _________________________________________ Significant initial  Findings: Abnormal Labs Reviewed  COMPREHENSIVE METABOLIC PANEL WITH GFR - Abnormal; Notable for the following components:      Result Value   Glucose, Bld 109 (*)    AST 488 (*)    ALT 368 (*)    Alkaline Phosphatase 155 (*)    Total Bilirubin 3.3 (*)    All other components within normal limits  CBC WITH DIFFERENTIAL/PLATELET - Abnormal; Notable for the following components:   Lymphs Abs 0.2 (*)    All other components within normal limits  PROTIME-INR -  Abnormal;  Notable for the following components:   Prothrombin Time 16.4 (*)    INR 1.3 (*)    All other components within normal limits  ACETAMINOPHEN  LEVEL - Abnormal; Notable for the following components:   Acetaminophen  (Tylenol ), Serum <10 (*)    All other components within normal limits  BILIRUBIN, DIRECT - Abnormal; Notable for the following components:   Bilirubin, Direct 2.1 (*)    All other components within normal limits       ECG: Ordered  BNP (last 3 results) Recent Labs    08/28/23 1744  BNP 297.9*   The recent clinical data is shown below. Vitals:   04/14/24 1600 04/14/24 1612 04/14/24 1842 04/14/24 1900  BP: (!) 154/96  (!) 180/92   Pulse: 90  93 93  Resp: 18  16   Temp:    98.8 F (37.1 C)  TempSrc:    Oral  SpO2: 100%  98% 98%  Weight:  64.4 kg    Height:  5' 3 (1.6 m)        WBC     Component Value Date/Time   WBC 8.6 04/14/2024 1343   LYMPHSABS 0.2 (L) 04/14/2024 1343   LYMPHSABS 0.5 (L) 04/07/2014 1221   MONOABS 0.6 04/14/2024 1343   MONOABS 0.4 04/07/2014 1221   EOSABS 0.0 04/14/2024 1343   EOSABS 0.1 04/07/2014 1221   BASOSABS 0.0 04/14/2024 1343   BASOSABS 0.0 04/07/2014 1221       Lactic Acid, Venous    Component Value Date/Time   LATICACIDVEN 0.7 04/14/2024 1537    Procalcitonin   Ordered      UA   ordered     Results for orders placed or performed during the hospital encounter of 04/14/24  Resp panel by RT-PCR (RSV, Flu A&B, Covid) Anterior Nasal Swab     Status: None   Collection Time: 04/14/24  3:11 PM   Specimen: Anterior Nasal Swab  Result Value Ref Range Status   SARS Coronavirus 2 by RT PCR NEGATIVE NEGATIVE Final   Influenza A by PCR NEGATIVE NEGATIVE Final   Influenza B by PCR NEGATIVE NEGATIVE Final         Resp Syncytial Virus by PCR NEGATIVE NEGATIVE Final          ABX started Antibiotics Given (last 72 hours)     Date/Time Action Medication Dose Rate   04/14/24 1846 New Bag/Given    piperacillin -tazobactam (ZOSYN ) IVPB 3.375 g 3.375 g 100 mL/hr         __________________________________________________________ Recent Labs  Lab 04/14/24 1343  NA 143  K 3.8  CO2 23  GLUCOSE 109*  BUN 20  CREATININE 0.87  CALCIUM  9.5    Cr    stable,   Lab Results  Component Value Date   CREATININE 0.87 04/14/2024   CREATININE 0.84 03/31/2024   CREATININE 0.91 03/30/2024    Recent Labs  Lab 04/14/24 1343  AST 488*  ALT 368*  ALKPHOS 155*  BILITOT 3.3*  PROT 7.1  ALBUMIN 3.6   Lab Results  Component Value Date   CALCIUM  9.5 04/14/2024   PHOS 2.5 03/30/2024        Plt: Lab Results  Component Value Date   PLT 159 04/14/2024    Recent Labs  Lab 04/14/24 1343  WBC 8.6  NEUTROABS 7.7  HGB 13.7  HCT 41.1  MCV 95.4  PLT 159    HG/HCT   stable,      Component Value Date/Time  HGB 13.7 04/14/2024 1343   HGB 14.7 01/07/2018 0833   HGB 13.5 04/07/2014 1221   HCT 41.1 04/14/2024 1343   HCT 43.7 01/07/2018 0833   HCT 41.4 04/07/2014 1221   MCV 95.4 04/14/2024 1343   MCV 94 01/07/2018 0833   MCV 93.9 04/07/2014 1221     Recent Labs  Lab 04/14/24 1939  LIPASE 28   No results for input(s): AMMONIA in the last 168 hours.    _______________________________________________ Hospitalist was called for admission for presumed cholecystitis    The following Work up has been ordered so far:  Orders Placed This Encounter  Procedures   Culture, blood (Routine x 2)   Resp panel by RT-PCR (RSV, Flu A&B, Covid) Anterior Nasal Swab   DG Chest 2 View   CT ABDOMEN PELVIS W CONTRAST   US  Abdomen Limited RUQ (LIVER/GB)   NM Hepatobiliary Liver Func   Comprehensive metabolic panel   CBC with Differential   Protime-INR   Urinalysis, w/ Reflex to Culture (Infection Suspected) -Urine, Clean Catch   Acetaminophen  level   Bilirubin, direct   Lipase, blood   CBC   Comprehensive metabolic panel   Notify physician (specify)  Specify: Notify provider for  possible Code Sepsis   Document height and weight   Consult to general surgery   Consult to hospitalist   I-Stat Lactic Acid, ED     Cultures:    Component Value Date/Time   SDES URINE, CLEAN CATCH 03/28/2024 1515   SPECREQUEST NONE 03/28/2024 1515   CULT (A) 03/28/2024 1515    <10,000 COLONIES/mL INSIGNIFICANT GROWTH Performed at [facility], [address], [place], [place] [address]    REPTSTATUS 03/29/2024 FINAL 03/28/2024 1515     Radiological Exams on Admission: US  Abdomen Limited RUQ (LIVER/GB) Result Date: 04/14/2024 CLINICAL DATA:  Cholecystitis EXAM: ULTRASOUND ABDOMEN LIMITED RIGHT UPPER QUADRANT COMPARISON:  Ultrasound 03/29/2024.  CT 04/14/2024 earlier FINDINGS: Gallbladder: Dilated gallbladder with significant wall thickening, new from previous. Dependent stones and tumefactive sludge. No reported Murphy's sign reported however the patient was not awake. Wall thickness approaches 7 mm. Common bile duct: Diameter: 2 mm Liver: No focal lesion identified. Within normal limits in parenchymal echogenicity. Portal vein is patent on color Doppler imaging with normal direction of blood flow towards the liver. Other: None. IMPRESSION: New gallbladder wall thickening and edema. Stones and sludge identified. Please correlate for other clinical findings of acute cholecystitis. Confirmatory HIDA scan could be considered as clinically appropriate. No biliary ductal dilatation. Electronically Signed   By: Ranell Bring M.D.   On: 04/14/2024 18:23   CT ABDOMEN PELVIS W CONTRAST Result Date: 04/14/2024 CLINICAL DATA:  Fever. Fatigue. Previous history of recent hospital stay for sepsis and dehydration. Abnormal liver function tests EXAM: CT ABDOMEN AND PELVIS WITH CONTRAST TECHNIQUE: Multidetector CT imaging of the abdomen and pelvis was performed using the standard protocol following bolus administration of intravenous contrast. RADIATION DOSE REDUCTION: This exam was performed according to  the departmental dose-optimization program which includes automated exposure control, adjustment of the mA and/or kV according to patient size and/or use of iterative reconstruction technique. CONTRAST:  65mL OMNIPAQUE  IOHEXOL  350 MG/ML SOLN COMPARISON:  Ultrasound abdomen 03/29/2024. Renal stone CT 03/28/2024. Recent swallowing study of 04/05/2024 FINDINGS: Lower chest: Mild opacity along the lung bases. Acute infiltrate is possible. Recommend follow-up. No pleural effusion. Breathing motion. Scattered vascular calcifications including along the coronary arteries. Hepatobiliary: Dilated gallbladder with wall thickening and wall edema. Stones are identified. Please  correlate for other clinical evidence of acute cholecystitis. No space-occupying liver lesion. Patent portal vein. Pancreas: Mild global atrophy of the pancreas. No obvious enhancing mass. Spleen: Spleen is nonenlarged. Adrenals/Urinary Tract: Minimal thickening of the lateral limb of the right adrenal gland, nonspecific. Left adrenal glands preserved. Global bilateral renal atrophy. Upper pole Bosniak 1 right-sided small renal cyst. There are bilateral nonobstructing renal stones. These were seen previously. Example on the right measures 10 mm and left 8 mm. Other foci identified as well. Ureters have normal course and caliber extending down to the urinary bladder. Preserved contour to the urinary bladder. Slightly prominent prostate. Stomach/Bowel: There is contrast seen throughout the colon, likely did to previous swallowing study but please correlate with history. Large bowel is nondilated. Scattered colonic stool. Normal retrocecal appendix. The stomach has some luminal fluid. Small bowel is nondilated. Vascular/Lymphatic: Normal caliber IVC. Diffuse vascular calcifications identified including along the aorta and iliac vessels. 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 Slightly saccular in appearance.  Second smaller area more caudal as well with what may be a non flow-limiting short segment dissection. Please correlate with history. Simple attention on follow-up. Reproductive: Heterogeneous enlarged prostate. Please correlate with the BPH changes and patient's PSA. Other: No free air or free fluid. Musculoskeletal: Scattered degenerative changes. Multilevel Schmorl's node deformities. Posterior multilevel disc bulging and posterior osteophytes. IMPRESSION: New mild areas of opacity along the lung bases, right greater left. Subtle infiltrate is possible. Recommend follow-up. Dilated gallbladder with slight wall thickening and some wall edema, new from previous. There are stones as well. Please correlate for any clinical evidence of acute cholecystitis and if needed additional workup such as HIDA scan. No bowel obstruction. Contrast seen along the course of the colon. Normal appendix. Bilateral nonobstructing renal stones. Mild ectasia of the abdominal aorta with some saccular areas. Simple follow up surveillance. Electronically Signed   By: Ranell Bring M.D.   On: 04/14/2024 17:28   DG Chest 2 View Result Date: 04/14/2024 CLINICAL DATA:  Sepsis. EXAM: CHEST - 2 VIEW COMPARISON:  X-ray 03/28/2024. FINDINGS: Underinflation. There is some linear opacity lung bases likely scar or atelectasis. No consolidation, pneumothorax or effusion. No edema. Normal cardiopericardial silhouette. Calcified aorta. Degenerative changes along the spine. There are loops of bowel in the abdomen which have some contrast. Please correlate with known history and prior fluoroscopy. IMPRESSION: Underinflation.  Basilar atelectasis. Electronically Signed   By: Ranell Bring M.D.   On: 04/14/2024 15:25   _______________________________________________________________________________________________________ Latest  Blood pressure (!) 180/92, pulse 93, temperature 98.8 F (37.1 C), temperature source Oral, resp. rate 16, height 5' 3 (1.6  m), weight 64.4 kg, SpO2 98%.   Vitals  labs and radiology finding personally reviewed  Review of Systems:    Pertinent positives include:  Fevers, chills, fatigue, abdominal pain Constitutional:  No weight loss, night sweats,  weight loss  HEENT:  No headaches, Difficulty swallowing,Tooth/dental problems,Sore throat,  No sneezing, itching, ear ache, nasal congestion, post nasal drip,  Cardio-vascular:  No chest pain, Orthopnea, PND, anasarca, dizziness, palpitations.no Bilateral lower extremity swelling  GI:  No heartburn, indigestion, , nausea, vomiting, diarrhea, change in bowel habits, loss of appetite, melena, blood in stool, hematemesis Resp:  no shortness of breath at rest. No dyspnea on exertion, No excess mucus, no productive cough, No non-productive cough, No coughing up of blood.No change in color of mucus.No wheezing. Skin:  no rash or lesions. No jaundice GU:  no dysuria, change in color of urine, no urgency or frequency. No straining to urinate.  No flank pain.  Musculoskeletal:  No joint pain or no joint swelling. No decreased range of motion. No back pain.  Psych:  No change in mood or affect. No depression or anxiety. No memory loss.  Neuro: no localizing neurological complaints, no tingling, no weakness, no double vision, no gait abnormality, no slurred speech, no confusion  All systems reviewed and apart from HOPI all are negative _______________________________________________________________________________________________ Past Medical History:   Past Medical History:  Diagnosis Date   Anemia, unspecified 12/22/2013   Arthritis    Benign prostatic hypertrophy    Congenital blindness    Coronary artery disease    a. 06/2004 Inf STEMI with PCI/BMS to RCA/LCX;  b. Cath 2014 40-50% focal bradycardia circ stenosis with a patent stent, RCA 50% stenosis, distal 60% stenosis in a previously stented area. 06/2011 Neg MV, EF 67%.   Dyslipidemia    GERD  (gastroesophageal reflux disease)    GI bleeding 2006   Hypertension    Hypothyroidism    Leukopenia 12/22/2013      Past Surgical History:  Procedure Laterality Date   COLONOSCOPY     CORONARY ANGIOPLASTY WITH STENT PLACEMENT     ESOPHAGOGASTRODUODENOSCOPY     HEMORRHOID SURGERY     LEFT HEART CATHETERIZATION WITH CORONARY ANGIOGRAM N/A 04/30/2013   Procedure: LEFT HEART CATHETERIZATION WITH CORONARY ANGIOGRAM;  Surgeon: Ozell Fell, MD;  Location: [facility] CATH LAB;  Service: Cardiovascular;  Laterality: N/A;    Social History:  Ambulatory  wheelchair bound, bed bound     reports that he has never smoked. He has never used smokeless tobacco. He reports that he does not drink alcohol and does not use drugs.     Family History:   Family History  Problem Relation Age of Onset   Cancer Mother 67       kidney   Cancer Father 76       blood cancer   Heart attack Cousin        died in her 43's.   ______________________________________________________________________________________________ Allergies: Allergies  Allergen Reactions   Benadryl [Diphenhydramine Hcl] Palpitations    2014  Took one tablet and had heart palpitations     Prior to Admission medications   Medication Sig Start Date End Date Taking? Authorizing Provider  acetaminophen  (TYLENOL ) 650 MG CR tablet Take 650 mg by mouth every 8 (eight) hours as needed for pain.    [provider]  aspirin  81 MG tablet Take 81 mg by mouth daily.      [provider]  atorvastatin  (LIPITOR) 20 MG tablet Take 20 mg by mouth daily. 01/20/24   [provider]  Cholecalciferol (VITAMIN D3) 1000 UNITS CAPS Take 1 capsule by mouth daily.      [provider]  dutasteride  (AVODART ) 0.5 MG capsule Take 0.5 mg by mouth daily.      [provider]  ferrous sulfate  325 (65 FE) MG EC tablet Take 1 tablet (325 mg total) by mouth daily with breakfast. 04/05/24 04/05/25  Patsy Lenis, MD   isosorbide  mononitrate (IMDUR ) 30 MG 24 hr tablet Take 1 tablet (30 mg total) by mouth daily. 04/05/24   Patsy Lenis, MD  levothyroxine  (SYNTHROID ) 88 MCG tablet Take 88 mcg by mouth daily before breakfast. 03/02/24   [provider]  metoprolol  succinate (TOPROL -XL) 25 MG 24 hr tablet Take 1 tablet (25 mg total) by mouth daily. TAKE  WITH OR IMMEDIATELY FOLLOWING A MEAL. 04/05/24   Patsy Lenis, MD  Multiple Vitamin (MULTIVITAMIN) tablet Take 1 tablet by mouth daily.    [provider]  nitroGLYCERIN  (NITROSTAT ) 0.4 MG SL tablet Place 1 tablet (0.4 mg total) under the tongue every 5 (five) minutes as needed. Must call and schedule appt for future refills 09/11/23   Lavona Agent, MD  senna-docusate (SENOKOT-S) 8.6-50 MG tablet Take 1 tablet by mouth daily. 04/05/24   Patsy Lenis, MD  tamsulosin  (FLOMAX ) 0.4 MG CAPS capsule Take 0.8 mg by mouth daily.    [provider]    ___________________________________________________________________________________________________ Physical Exam:    04/14/2024    7:00 PM 04/14/2024    6:42 PM 04/14/2024    4:12 PM  Vitals with BMI  Height   5' 3  Weight   142 lbs  BMI   25.16  Systolic  180   Diastolic  92   Pulse 93 93      1. General:  in No  Acute distress   Chronically ill   -appearing 2. Psychological: Alert and   Oriented 3. Head/ENT:    Dry Mucous Membranes                          Head Non traumatic, neck supple                       Poor Dentition 4. SKIN: decreased Skin turgor,  Skin clean Dry and intact no rash    5. Heart: Regular rate and rhythm  Murmur, no Rub or gallop 6. Lungs:  no wheezes or crackles   7. Abdomen: Soft,  non-tender, Non distended  obese  bowel sounds present 8. Lower extremities: no clubbing, cyanosis, no  edema 9. Neurologically Grossly intact, moving all 4 extremities equally  10. MSK: Normal range of motion    Chart has been  reviewed  ______________________________________________________________________________________________  Assessment/Plan 78 y.o. male with medical history significant of CAD status post PCI in 2005, hypertension, hypothyroidism, blindness, anemia, BPH   Admitted for  Transaminitis, Biliary sludge, CAP and possible cholecystis   Present on Admission:  Acute cholecystitis  Cholecystitis  Benign prostatic hyperplasia  CAD (coronary artery disease) of artery bypass graft  Dyslipidemia  Essential hypertension, benign  Hypothyroidism  CAP (community acquired pneumonia)     Acute cholecystitis Imaging worrisome for acute cholecystitis appreciate general surgery consult Recommended HIDA scan If HIDA scan positive she will need cardiology consult for clearance N.p.o. at midnight   Benign prostatic hyperplasia Resume Flomax   CAD (coronary artery disease) of artery bypass graft Hold aspirin  as may need surgical intervention hold Lipitor given elevated LFTs continue Imdur  if blood pressure allows  Cholecystitis Presumed cholecystitis HIDA scan pending appreciate general surgery consult continue Zosyn  continue to follow FTs  Dyslipidemia Hold Lipitor given elevated LFTs  Essential hypertension, benign Continue Imdur  and Toprol   Hypothyroidism - Check TSH continue home medications Synthroid  at 88mcg po q day   CAP (community acquired pneumonia)  - -Patient presenting with   fever      and infiltrate in   lower lobe on chest x-ray -Infiltrate on CXR and 2-3 characteristics (fever, leukocytosis, purulent sputum) are consistent with pneumonia. -This appears to be most likely community-acquired pneumonia.     will admit for treatment of CAP will start on appropriate antibiotic coverage. -zosyn  azithromycin    Obtain:  sputum cultures,  influenza serologies negative                  COVID PCR negative                   blood cultures and sputum cultures ordered                    strep pneumo UA antigen,                    check for Legionella antigen.                Provide oxygen as needed.    Other plan as per orders.  DVT prophylaxis:  SCD  Code Status:    Code Status: Prior FULL CODE  as per patient    I had personally discussed CODE STATUS with patient and family   ACP   none     Family Communication:   Family   at  Bedside  plan of care was discussed   with  Sister   Diet npo post midnight   Disposition Plan:                              Back to current facility when stable                             Following barriers for discharge:                                                          Pain controlled with PO medications                             able to transition to PO antibiotics                             Will need to be able to tolerate PO                                                       Will need consultants to evaluate patient prior to discharge                       Consult Orders  (From admission, onward)           Start     Ordered   04/14/24 2129  SLP eval and treat Reason for evaluation: .Swallowing evaluation (BSE, MBS and/or diet order as indicated)  Once       Question:  Reason for evaluation  Answer:  .Swallowing evaluation (BSE, MBS and/or diet order as indicated)   04/14/24 2128   04/14/24 1924  Consult to hospitalist  Once       Provider:  (Not yet assigned)  Question Answer Comment  Place call to: [facility]   Reason for Consult Admit      04/14/24 1923  Would benefit from PT/OT eval prior to DC  Ordered                                      Transition of care consulted                                   Consults called: general surgery   Admission status:  ED Disposition     ED Disposition  Admit   Condition  --   Comment  Hospital Area: [facility] [[id number]]  Level of Care: Telemetry Medical [[id number]]  May admit  patient to [facility] or [facility] if equivalent level of care is available:: No  Covid Evaluation: Asymptomatic - no recent exposure (last 10 days) testing not required  Diagnosis: Cholecystitis [807368]  Admitting Physician: Claudine Stallings [[id number]]  Attending Physician: Nathasha Fiorillo [[id number]]  Certification:: I certify this patient will need inpatient services for at least 2 midnights  Expected Medical Readiness: 04/16/2024            inpatient     I Expect 2 midnight stay secondary to severity of patient's current illness need for inpatient interventions justified by the following:     Severe lab/radiological/exam abnormalities including:    Transaminitis     and extensive comorbidities including:  CHF  CAD   That are currently affecting medical management.   I expect  patient to be hospitalized for 2 midnights requiring inpatient medical care.  Patient is at high risk for adverse outcome (such as loss of life or disability) if not treated.  Indication for inpatient stay as follows:   inability to maintain oral hydration   Need for operative/procedural  intervention    Need for IV antibiotics, IV fluids,    Level of care     tele  For 24H      Amal Renbarger 04/14/2024, 11:45 PM    [facility]     after 2 AM please page floor coverage   If 7AM-7PM, please contact the day team taking care of the patient using [email or web address]

## 2024-04-14 NOTE — Subjective & Objective (Signed)
 Patient presents from [facility] increased fatigue and low-grade fever 100.6 at baseline patient has blindness and nonambulant She denied any cough but does endorse some abdominal pain yesterday no nausea no vomiting or diarrhea CT abdomen pelvis showed possible infiltrates as well as dilated gallbladder with some wall edema recommended HIDA scan patient started on Zosyn  Discussed with general surgery Dr. Ann who seen patient in consult plan for HIDA scan

## 2024-04-15 ENCOUNTER — Inpatient Hospital Stay (HOSPITAL_COMMUNITY)

## 2024-04-15 ENCOUNTER — Other Ambulatory Visit (HOSPITAL_COMMUNITY): Payer: Self-pay | Admitting: Radiology

## 2024-04-15 DIAGNOSIS — I509 Heart failure, unspecified: Secondary | ICD-10-CM

## 2024-04-15 DIAGNOSIS — K819 Cholecystitis, unspecified: Secondary | ICD-10-CM | POA: Diagnosis not present

## 2024-04-15 LAB — GLUCOSE, CAPILLARY
Glucose-Capillary: 66 mg/dL — ABNORMAL LOW (ref 70–99)
Glucose-Capillary: 73 mg/dL (ref 70–99)

## 2024-04-15 LAB — URINALYSIS, W/ REFLEX TO CULTURE (INFECTION SUSPECTED)
Bacteria, UA: NONE SEEN
Glucose, UA: NEGATIVE mg/dL
Hgb urine dipstick: NEGATIVE
Ketones, ur: NEGATIVE mg/dL
Leukocytes,Ua: NEGATIVE
Nitrite: NEGATIVE
Protein, ur: 100 mg/dL — AB
Specific Gravity, Urine: >1.046 — ABNORMAL HIGH (ref 1.005–1.030)
pH: 5 (ref 5.0–8.0)

## 2024-04-15 LAB — COMPREHENSIVE METABOLIC PANEL WITH GFR
ALT: 319 U/L — ABNORMAL HIGH (ref 0–44)
AST: 224 U/L — ABNORMAL HIGH (ref 15–41)
Albumin: 3.1 g/dL — ABNORMAL LOW (ref 3.5–5.0)
Alkaline Phosphatase: 154 U/L — ABNORMAL HIGH (ref 38–126)
Anion gap: 8 (ref 5–15)
BUN: 18 mg/dL (ref 8–23)
CO2: 24 mmol/L (ref 22–32)
Calcium: 9.3 mg/dL (ref 8.9–10.3)
Chloride: 109 mmol/L (ref 98–111)
Creatinine, Ser: 0.91 mg/dL (ref 0.61–1.24)
GFR, Estimated: >60 mL/min (ref >60)
Glucose, Bld: 90 mg/dL (ref 70–99)
Potassium: 3.8 mmol/L (ref 3.5–5.1)
Sodium: 141 mmol/L (ref 135–145)
Total Bilirubin: 3.5 mg/dL — ABNORMAL HIGH (ref 0.0–1.2)
Total Protein: 6.1 g/dL — ABNORMAL LOW (ref 6.5–8.1)

## 2024-04-15 LAB — HEMOGLOBIN A1C
Hgb A1c MFr Bld: 4.6 % — ABNORMAL LOW (ref 4.8–5.6)
Mean Plasma Glucose: 85.32 mg/dL

## 2024-04-15 LAB — CBC
HCT: 37.8 % — ABNORMAL LOW (ref 39.0–52.0)
Hemoglobin: 12.5 g/dL — ABNORMAL LOW (ref 13.0–17.0)
MCH: 31.3 pg (ref 26.0–34.0)
MCHC: 33.1 g/dL (ref 30.0–36.0)
MCV: 94.7 fL (ref 80.0–100.0)
Platelets: 135 K/uL — ABNORMAL LOW (ref 150–400)
RBC: 3.99 MIL/uL — ABNORMAL LOW (ref 4.22–5.81)
RDW: 11.8 % (ref 11.5–15.5)
WBC: 8.1 K/uL (ref 4.0–10.5)
nRBC: 0 % (ref 0.0–0.2)

## 2024-04-15 LAB — CK: Total CK: 68 U/L (ref 49–397)

## 2024-04-15 LAB — ECHOCARDIOGRAM COMPLETE
Area-P 1/2: 3.77 cm2
Height: 63 in
S' Lateral: 2.5 cm
Weight: 2271.62 [oz_av]

## 2024-04-15 LAB — PHOSPHORUS: Phosphorus: 3.5 mg/dL (ref 2.5–4.6)

## 2024-04-15 LAB — TSH: TSH: 2.168 u[IU]/mL (ref 0.350–4.500)

## 2024-04-15 LAB — MAGNESIUM: Magnesium: 2 mg/dL (ref 1.7–2.4)

## 2024-04-15 LAB — PREALBUMIN: Prealbumin: 17 mg/dL — ABNORMAL LOW (ref 18–38)

## 2024-04-15 LAB — PROCALCITONIN: Procalcitonin: 0.13 ng/mL

## 2024-04-15 LAB — STREP PNEUMONIAE URINARY ANTIGEN: Strep Pneumo Urinary Antigen: NEGATIVE

## 2024-04-15 LAB — T4, FREE: Free T4: 1.4 ng/dL — ABNORMAL HIGH (ref 0.61–1.12)

## 2024-04-15 MED ORDER — DEXTROSE IN LACTATED RINGERS 5 % IV SOLN: INTRAVENOUS | Status: AC

## 2024-04-15 MED ORDER — GADOBUTROL 1 MMOL/ML IV SOLN
6.0000 mL | Freq: Once | INTRAVENOUS | Status: AC | PRN
  Administered 2024-04-15: 6 mL via INTRAVENOUS

## 2024-04-15 NOTE — Progress Notes (Signed)
 Pt arrived to 6 north room 20 alert and oriented x4. Pain level 0/10. Bed in lowest position. Call light in reach. Taught pt how to use call bell and he verbalized understanding with return demonstration. Bed alarm on. All needs met at this time.

## 2024-04-15 NOTE — ED Notes (Signed)
 Pt used bedpan for bowel movement and was placed in a clean and dry brief. Call light within reach and pt educated on how to use it

## 2024-04-15 NOTE — ED Notes (Signed)
 Gallbladder study to be done-tech called stating not to give any meds, blood, or anything to eat/drink until after the exam

## 2024-04-15 NOTE — Progress Notes (Signed)
 Progress Note     Subjective: Pt sleeping this AM, woke up briefly and denied abdominal pain but then went back to sleep.   Objective: Vital signs in last 24 hours: Temp:  [98.1 F (36.7 C)-98.9 F (37.2 C)] 98.5 F (36.9 C) (07/17 0756) Pulse Rate:  [29-97] 81 (07/17 0600) Resp:  [9-20] 12 (07/17 0600) BP: (88-180)/(55-105) 108/58 (07/17 0600) SpO2:  [94 %-100 %] 99 % (07/17 0600) Weight:  [64.4 kg] 64.4 kg (07/16 1612)    Intake/Output from previous day: No intake/output data recorded. Intake/Output this shift: Total I/O In: 662.9 [I.V.:620.2; IV Piggyback:42.7] Out: -   PE: General: WD, elderly male who is laying in bed in NAD Heart: regular, rate, and rhythm.   Lungs: No wheezes, rhonchi, or rales noted.  Respiratory effort nonlabored Abd: soft, NT, ND, no masses, hernias, or organomegaly   Lab Results:  Recent Labs    04/14/24 1343 04/15/24 0257  WBC 8.6 8.1  HGB 13.7 12.5*  HCT 41.1 37.8*  PLT 159 135*   BMET Recent Labs    04/14/24 1343 04/15/24 0257  NA 143 141  K 3.8 3.8  CL 109 109  CO2 23 24  GLUCOSE 109* 90  BUN 20 18  CREATININE 0.87 0.91  CALCIUM  9.5 9.3   PT/INR Recent Labs    04/14/24 1343  LABPROT 16.4*  INR 1.3*   CMP     Component Value Date/Time   NA 141 04/15/2024 0257   NA 147 (H) 08/09/2019 0830   NA 143 04/07/2014 1221   K 3.8 04/15/2024 0257   K 4.3 04/07/2014 1221   CL 109 04/15/2024 0257   CO2 24 04/15/2024 0257   CO2 24 04/07/2014 1221   GLUCOSE 90 04/15/2024 0257   GLUCOSE 105 04/07/2014 1221   BUN 18 04/15/2024 0257   BUN 17 08/09/2019 0830   BUN 19.1 04/07/2014 1221   CREATININE 0.91 04/15/2024 0257   CREATININE 1.0 04/07/2014 1221   CALCIUM  9.3 04/15/2024 0257   CALCIUM  9.0 04/07/2014 1221   PROT 6.1 (L) 04/15/2024 0257   PROT 7.0 08/09/2019 0830   PROT 7.2 04/07/2014 1221   ALBUMIN 3.1 (L) 04/15/2024 0257   ALBUMIN 4.3 08/09/2019 0830   ALBUMIN 3.8 04/07/2014 1221   AST 224 (H) 04/15/2024  0257   AST 19 04/07/2014 1221   ALT 319 (H) 04/15/2024 0257   ALT 21 04/07/2014 1221   ALKPHOS 154 (H) 04/15/2024 0257   ALKPHOS 60 04/07/2014 1221   BILITOT 3.5 (H) 04/15/2024 0257   BILITOT 0.4 08/09/2019 0830   BILITOT 0.92 04/07/2014 1221   GFRNONAA >60 04/15/2024 0257   GFRAA 71 08/09/2019 0830   Lipase     Component Value Date/Time   LIPASE 28 04/14/2024 1939       Studies/Results: US  Abdomen Limited RUQ (LIVER/GB) Result Date: 04/14/2024 CLINICAL DATA:  Cholecystitis EXAM: ULTRASOUND ABDOMEN LIMITED RIGHT UPPER QUADRANT COMPARISON:  Ultrasound 03/29/2024.  CT 04/14/2024 earlier FINDINGS: Gallbladder: Dilated gallbladder with significant wall thickening, new from previous. Dependent stones and tumefactive sludge. No reported Murphy's sign reported however the patient was not awake. Wall thickness approaches 7 mm. Common bile duct: Diameter: 2 mm Liver: No focal lesion identified. Within normal limits in parenchymal echogenicity. Portal vein is patent on color Doppler imaging with normal direction of blood flow towards the liver. Other: None. IMPRESSION: New gallbladder wall thickening and edema. Stones and sludge identified. Please correlate for other clinical findings of acute cholecystitis. Confirmatory HIDA  scan could be considered as clinically appropriate. No biliary ductal dilatation. Electronically Signed   By: Ranell Bring M.D.   On: 04/14/2024 18:23   CT ABDOMEN PELVIS W CONTRAST Result Date: 04/14/2024 CLINICAL DATA:  Fever. Fatigue. Previous history of recent hospital stay for sepsis and dehydration. Abnormal liver function tests EXAM: CT ABDOMEN AND PELVIS WITH CONTRAST TECHNIQUE: Multidetector CT imaging of the abdomen and pelvis was performed using the standard protocol following bolus administration of intravenous contrast. RADIATION DOSE REDUCTION: This exam was performed according to the departmental dose-optimization program which includes automated exposure control,  adjustment of the mA and/or kV according to patient size and/or use of iterative reconstruction technique. CONTRAST:  65mL OMNIPAQUE  IOHEXOL  350 MG/ML SOLN COMPARISON:  Ultrasound abdomen 03/29/2024. Renal stone CT 03/28/2024. Recent swallowing study of 04/05/2024 FINDINGS: Lower chest: Mild opacity along the lung bases. Acute infiltrate is possible. Recommend follow-up. No pleural effusion. Breathing motion. Scattered vascular calcifications including along the coronary arteries. Hepatobiliary: Dilated gallbladder with wall thickening and wall edema. Stones are identified. Please correlate for other clinical evidence of acute cholecystitis. No space-occupying liver lesion. Patent portal vein. Pancreas: Mild global atrophy of the pancreas. No obvious enhancing mass. Spleen: Spleen is nonenlarged. Adrenals/Urinary Tract: Minimal thickening of the lateral limb of the right adrenal gland, nonspecific. Left adrenal glands preserved. Global bilateral renal atrophy. Upper pole Bosniak 1 right-sided small renal cyst. There are bilateral nonobstructing renal stones. These were seen previously. Example on the right measures 10 mm and left 8 mm. Other foci identified as well. Ureters have normal course and caliber extending down to the urinary bladder. Preserved contour to the urinary bladder. Slightly prominent prostate. Stomach/Bowel: There is contrast seen throughout the colon, likely did to previous swallowing study but please correlate with history. Large bowel is nondilated. Scattered colonic stool. Normal retrocecal appendix. The stomach has some luminal fluid. Small bowel is nondilated. Vascular/Lymphatic: Normal caliber IVC. Diffuse vascular calcifications identified including along the aorta and iliac vessels. 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 Slightly saccular in appearance. Second smaller area more caudal as well with what may be a non flow-limiting short  segment dissection. Please correlate with history. Simple attention on follow-up. Reproductive: Heterogeneous enlarged prostate. Please correlate with the BPH changes and patient's PSA. Other: No free air or free fluid. Musculoskeletal: Scattered degenerative changes. Multilevel Schmorl's node deformities. Posterior multilevel disc bulging and posterior osteophytes. IMPRESSION: New mild areas of opacity along the lung bases, right greater left. Subtle infiltrate is possible. Recommend follow-up. Dilated gallbladder with slight wall thickening and some wall edema, new from previous. There are stones as well. Please correlate for any clinical evidence of acute cholecystitis and if needed additional workup such as HIDA scan. No bowel obstruction. Contrast seen along the course of the colon. Normal appendix. Bilateral nonobstructing renal stones. Mild ectasia of the abdominal aorta with some saccular areas. Simple follow up surveillance. Electronically Signed   By: Ranell Bring M.D.   On: 04/14/2024 17:28   DG Chest 2 View Result Date: 04/14/2024 CLINICAL DATA:  Sepsis. EXAM: CHEST - 2 VIEW COMPARISON:  X-ray 03/28/2024. FINDINGS: Underinflation. There is some linear opacity lung bases likely scar or atelectasis. No consolidation, pneumothorax or effusion. No edema. Normal cardiopericardial silhouette. Calcified aorta. Degenerative changes along the spine. There are loops of bowel in the abdomen which have some contrast. Please correlate with known history and prior fluoroscopy. IMPRESSION: Underinflation.  Basilar atelectasis. Electronically  Signed   By: Ranell Bring M.D.   On: 04/14/2024 15:25    Anti-infectives: Anti-infectives (From admission, onward)    Start     Dose/Rate Route Frequency Ordered Stop   04/15/24 1800  azithromycin  (ZITHROMAX ) 500 mg in sodium chloride  0.9 % 250 mL IVPB        500 mg 250 mL/hr over 60 Minutes Intravenous Every 24 hours 04/14/24 2102     04/15/24 0200   piperacillin -tazobactam (ZOSYN ) IVPB 3.375 g        3.375 g 12.5 mL/hr over 240 Minutes Intravenous Every 8 hours 04/14/24 2105     04/14/24 1745  piperacillin -tazobactam (ZOSYN ) IVPB 3.375 g        3.375 g 100 mL/hr over 30 Minutes Intravenous  Once 04/14/24 1744 04/14/24 1913        Assessment/Plan  Fever Elevated LFTs with hyperbilirubinemia Abnormal gallbladder on imaging - CT yesterday with dilated gallbladder with some wall thickening and edema and cholelithiasis - RUQ US  with some gallbladder wall thickening and edema - patient is non-tender on exam, afebrile overnight and no leukocytosis - Tbili 3.5 from 3.3, AST/ALT downtrending slightly, Alk Phos stable - low suspicion for cholecystitis clinically, would recommend MRCP in setting of rising Tbili. Question whether patient is passing stones - If MRCP negative for acute findings or still unclear can consider HIDA  FEN: NPO, IVF per [facility] VTE: ok to have SQH or LMWH from surgery standpoint ID: azithromycin /zosyn   - per [facility] -  CAD s/p remote stent placement HTN Hypothyroidism GERD BPH Congenital blindness   LOS: 1 day   I reviewed ED provider notes, hospitalist notes, last 24 h vitals and pain scores, last 48 h intake and output, last 24 h labs and trends, and last 24 h imaging results.  This care required moderate level of medical decision making.    Burnard JONELLE Louder, [facility] 04/15/2024, 8:33 AM Please see Amion for pager number during day hours 7:00am-4:30pm

## 2024-04-15 NOTE — Progress Notes (Signed)
 PROGRESS NOTE  Dustin Hess FMW:[id number] DOB: May 05, 1946 DOA: 04/14/2024 PCP: [name], MD   LOS: 1 day   Brief Narrative / Interim history: 78 year old male with history of CAD with PCI in 2005, HTN, hypothyroidism, blindness who comes in from [facility] with increased fatigue, low-grade fever.  Patient had abdominal pain the day prior without nausea or vomiting.  There is no reported cough or chest congestion.  CT scan of the abdomen and pelvis on admission showed possible pulmonary infiltrates as well as dilated gallbladder with some wall edema.  Surgery was consulted, he was placed on antibiotics and admitted to the hospital  Subjective / 24h Interval events: Sleepy this morning, but wakes up and denies any abdominal pain, no cough or chest congestion, denies any shortness of breath.  No nausea or vomiting.  Assesement and Plan: Principal Problem:   Cholecystitis Active Problems:   Hypothyroidism   Essential hypertension, benign   Benign prostatic hyperplasia   CAD (coronary artery disease) of artery bypass graft   Dyslipidemia   Acute cholecystitis   CAP (community acquired pneumonia)  Principal problem Cholelithiasis, concern for cholecystitis -no significant abdominal pain, gallbladder ultrasound showed stones and sludge.  His LFTs are elevated, and bilirubin is up too, I wonder whether he has been passing some stones.  Surgery following, will obtain MRI/MRCP to evaluate for choledocholithiasis.  Meanwhile continue antibiotics, n.p.o.  Active problems Possible pneumonia -he does not have any cough or respiratory symptoms.  Will watch, on antibiotics anyway  CAD-no chest pain, continue metoprolol , Imdur   Hypothyroidism-continue Synthroid   BPH-continue tamsulosin   Scheduled Meds:  isosorbide  mononitrate  30 mg Oral Daily   levothyroxine   88 mcg Oral QAC breakfast   metoprolol  succinate  25 mg Oral Daily   tamsulosin   0.8 mg Oral Daily   Continuous  Infusions:  azithromycin      piperacillin -tazobactam (ZOSYN )  IV Stopped (04/15/24 0517)   PRN Meds:.acetaminophen  **OR** acetaminophen , bisacodyl , fentaNYL  (SUBLIMAZE ) injection, HYDROcodone -acetaminophen , ondansetron  **OR** ondansetron  (ZOFRAN ) IV  Current Outpatient Medications  Medication Instructions   acetaminophen  (TYLENOL ) 650 mg, Oral, Every 8 hours PRN   aspirin  81 mg, Daily   atorvastatin  (LIPITOR) 20 mg, Daily   Cholecalciferol (VITAMIN D3) 1000 UNITS CAPS 1 capsule, Daily   dutasteride  (AVODART ) 0.5 mg, Daily   ferrous sulfate  325 mg, Oral, Daily with breakfast   isosorbide  mononitrate (IMDUR ) 30 mg, Oral, Daily   levothyroxine  (SYNTHROID ) 88 mcg, Daily before breakfast   metoprolol  succinate (TOPROL -XL) 25 mg, Oral, Daily, TAKE WITH OR IMMEDIATELY FOLLOWING A MEAL.   Multiple Vitamin (MULTIVITAMIN) tablet 1 tablet, Daily   nitroGLYCERIN  (NITROSTAT ) 0.4 mg, Sublingual, Every 5 min PRN, Must call and schedule appt for future refills   senna-docusate (SENOKOT-S) 8.6-50 MG tablet 1 tablet, Oral, Daily   tamsulosin  (FLOMAX ) 0.8 mg, Daily    Diet Orders (From admission, onward)     Start     Ordered   04/15/24 0001  Diet NPO time specified  Diet effective midnight        04/14/24 2128            DVT prophylaxis: SCDs Start: 04/14/24 2129   Lab Results  Component Value Date   PLT 135 (L) 04/15/2024      Code Status: Full Code  Family Communication: no family at bedside   Status is: Inpatient Remains inpatient appropriate because: severity of illness  Level of care: Telemetry Medical  Consultants:  General surgery   Objective: Vitals:  04/15/24 0530 04/15/24 0600 04/15/24 0756 04/15/24 0900  BP: (!) 92/59 (!) 108/58  126/76  Pulse: 79 81  78  Resp: 11 12  12   Temp:   98.5 F (36.9 C)   TempSrc:   Axillary   SpO2: 94% 99%  100%  Weight:      Height:        Intake/Output Summary (Last 24 hours) at 04/15/2024 1015 Last data filed at 04/15/2024  0701 Gross per 24 hour  Intake 662.9 ml  Output --  Net 662.9 ml   Wt Readings from Last 3 Encounters:  04/14/24 64.4 kg  03/28/24 64.4 kg  02/24/24 64.4 kg    Examination:  Constitutional: NAD Eyes: no scleral icterus ENMT: Mucous membranes are moist.  Neck: normal, supple Respiratory: clear to auscultation bilaterally, no wheezing, no crackles. Normal respiratory effort.  Cardiovascular: Regular rate and rhythm, no murmurs / rubs / gallops. No LE edema.  Abdomen: non distended, no tenderness. Bowel sounds positive.  Musculoskeletal: no clubbing / cyanosis.    Data Reviewed: I have independently reviewed following labs and imaging studies   CBC Recent Labs  Lab 04/14/24 1343 04/15/24 0257  WBC 8.6 8.1  HGB 13.7 12.5*  HCT 41.1 37.8*  PLT 159 135*  MCV 95.4 94.7  MCH 31.8 31.3  MCHC 33.3 33.1  RDW 11.6 11.8  LYMPHSABS 0.2*  --   MONOABS 0.6  --   EOSABS 0.0  --   BASOSABS 0.0  --     Recent Labs  Lab 04/14/24 1343 04/14/24 1440 04/14/24 1537 04/15/24 0257  NA 143  --   --  141  K 3.8  --   --  3.8  CL 109  --   --  109  CO2 23  --   --  24  GLUCOSE 109*  --   --  90  BUN 20  --   --  18  CREATININE 0.87  --   --  0.91  CALCIUM  9.5  --   --  9.3  AST 488*  --   --  224*  ALT 368*  --   --  319*  ALKPHOS 155*  --   --  154*  BILITOT 3.3*  --   --  3.5*  ALBUMIN 3.6  --   --  3.1*  MG  --   --   --  2.0  PROCALCITON  --   --   --  0.13  LATICACIDVEN  --  0.9 0.7  --   INR 1.3*  --   --   --   TSH  --   --   --  2.168  HGBA1C  --   --   --  4.6*    ------------------------------------------------------------------------------------------------------------------ No results for input(s): CHOL, HDL, LDLCALC, TRIG, CHOLHDL, LDLDIRECT in the last 72 hours.  Lab Results  Component Value Date   HGBA1C 4.6 (L) 04/15/2024    ------------------------------------------------------------------------------------------------------------------ Recent Labs    04/15/24 0257  TSH 2.168    Cardiac Enzymes No results for input(s): CKMB, TROPONINI, MYOGLOBIN in the last 168 hours.  Invalid input(s): CK ------------------------------------------------------------------------------------------------------------------    Component Value Date/Time   BNP 297.9 (H) 08/28/2023 1744    CBG: Recent Labs  Lab 04/14/24 2235  GLUCAP 99    Recent Results (from the past 240 hours)  Culture, blood (Routine x 2)     Status: None (Preliminary result)   Collection Time: 04/14/24  2:50 PM  Specimen: BLOOD RIGHT HAND  Result Value Ref Range Status   Specimen Description BLOOD RIGHT HAND  Final   Special Requests   Final    AEROBIC BOTTLE ONLY Blood Culture results may not be optimal due to an inadequate volume of blood received in culture bottles   Culture   Final    NO GROWTH < 24 HOURS Performed at [facility], [address] [place], [place] [address]    Report Status PENDING  Incomplete  Resp panel by RT-PCR (RSV, Flu A&B, Covid) Anterior Nasal Swab     Status: None   Collection Time: 04/14/24  3:11 PM   Specimen: Anterior Nasal Swab  Result Value Ref Range Status   SARS Coronavirus 2 by RT PCR NEGATIVE NEGATIVE Final   Influenza A by PCR NEGATIVE NEGATIVE Final   Influenza B by PCR NEGATIVE NEGATIVE Final    Comment: (NOTE) The Xpert Xpress SARS-CoV-2/FLU/RSV plus assay is intended as an aid in the diagnosis of influenza from Nasopharyngeal swab specimens and should not be used as a sole basis for treatment. Nasal washings and aspirates are unacceptable for Xpert Xpress SARS-CoV-2/FLU/RSV testing.  Fact Sheet for Patients: BloggerCourse.com  Fact Sheet for Healthcare Providers: SeriousBroker.it  This test is not yet approved or cleared by  the [place]  FDA and has been authorized for detection and/or diagnosis of SARS-CoV-2 by FDA under an Emergency Use Authorization (EUA). This EUA will remain in effect (meaning this test can be used) for the duration of the COVID-19 declaration under Section 564(b)(1) of the Act, 21 U.S.C. section 360bbb-3(b)(1), unless the authorization is terminated or revoked.     Resp Syncytial Virus by PCR NEGATIVE NEGATIVE Final    Comment: (NOTE) Fact Sheet for Patients: BloggerCourse.com  Fact Sheet for Healthcare Providers: SeriousBroker.it  This test is not yet approved or cleared by the [place]  FDA and has been authorized for detection and/or diagnosis of SARS-CoV-2 by FDA under an Emergency Use Authorization (EUA). This EUA will remain in effect (meaning this test can be used) for the duration of the COVID-19 declaration under Section 564(b)(1) of the Act, 21 U.S.C. section 360bbb-3(b)(1), unless the authorization is terminated or revoked.  Performed at [facility], [address], [place], [place] [address]      Radiology Studies: US  Abdomen Limited RUQ (LIVER/GB) Result Date: 04/14/2024 CLINICAL DATA:  Cholecystitis EXAM: ULTRASOUND ABDOMEN LIMITED RIGHT UPPER QUADRANT COMPARISON:  Ultrasound 03/29/2024.  CT 04/14/2024 earlier FINDINGS: Gallbladder: Dilated gallbladder with significant wall thickening, new from previous. Dependent stones and tumefactive sludge. No reported Murphy's sign reported however the patient was not awake. Wall thickness approaches 7 mm. Common bile duct: Diameter: 2 mm Liver: No focal lesion identified. Within normal limits in parenchymal echogenicity. Portal vein is patent on color Doppler imaging with normal direction of blood flow towards the liver. Other: None. IMPRESSION: New gallbladder wall thickening and edema. Stones and sludge identified. Please correlate for other clinical findings of  acute cholecystitis. Confirmatory HIDA scan could be considered as clinically appropriate. No biliary ductal dilatation. Electronically Signed   By: Ranell Bring M.D.   On: 04/14/2024 18:23   CT ABDOMEN PELVIS W CONTRAST Result Date: 04/14/2024 CLINICAL DATA:  Fever. Fatigue. Previous history of recent hospital stay for sepsis and dehydration. Abnormal liver function tests EXAM: CT ABDOMEN AND PELVIS WITH CONTRAST TECHNIQUE: Multidetector CT imaging of the abdomen and pelvis was performed using the standard protocol following bolus administration of intravenous contrast. RADIATION  DOSE REDUCTION: This exam was performed according to the departmental dose-optimization program which includes automated exposure control, adjustment of the mA and/or kV according to patient size and/or use of iterative reconstruction technique. CONTRAST:  65mL OMNIPAQUE  IOHEXOL  350 MG/ML SOLN COMPARISON:  Ultrasound abdomen 03/29/2024. Renal stone CT 03/28/2024. Recent swallowing study of 04/05/2024 FINDINGS: Lower chest: Mild opacity along the lung bases. Acute infiltrate is possible. Recommend follow-up. No pleural effusion. Breathing motion. Scattered vascular calcifications including along the coronary arteries. Hepatobiliary: Dilated gallbladder with wall thickening and wall edema. Stones are identified. Please correlate for other clinical evidence of acute cholecystitis. No space-occupying liver lesion. Patent portal vein. Pancreas: Mild global atrophy of the pancreas. No obvious enhancing mass. Spleen: Spleen is nonenlarged. Adrenals/Urinary Tract: Minimal thickening of the lateral limb of the right adrenal gland, nonspecific. Left adrenal glands preserved. Global bilateral renal atrophy. Upper pole Bosniak 1 right-sided small renal cyst. There are bilateral nonobstructing renal stones. These were seen previously. Example on the right measures 10 mm and left 8 mm. Other foci identified as well. Ureters have normal course and  caliber extending down to the urinary bladder. Preserved contour to the urinary bladder. Slightly prominent prostate. Stomach/Bowel: There is contrast seen throughout the colon, likely did to previous swallowing study but please correlate with history. Large bowel is nondilated. Scattered colonic stool. Normal retrocecal appendix. The stomach has some luminal fluid. Small bowel is nondilated. Vascular/Lymphatic: Normal caliber IVC. Diffuse vascular calcifications identified including along the aorta and iliac vessels. 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 Slightly saccular in appearance. Second smaller area more caudal as well with what may be a non flow-limiting short segment dissection. Please correlate with history. Simple attention on follow-up. Reproductive: Heterogeneous enlarged prostate. Please correlate with the BPH changes and patient's PSA. Other: No free air or free fluid. Musculoskeletal: Scattered degenerative changes. Multilevel Schmorl's node deformities. Posterior multilevel disc bulging and posterior osteophytes. IMPRESSION: New mild areas of opacity along the lung bases, right greater left. Subtle infiltrate is possible. Recommend follow-up. Dilated gallbladder with slight wall thickening and some wall edema, new from previous. There are stones as well. Please correlate for any clinical evidence of acute cholecystitis and if needed additional workup such as HIDA scan. No bowel obstruction. Contrast seen along the course of the colon. Normal appendix. Bilateral nonobstructing renal stones. Mild ectasia of the abdominal aorta with some saccular areas. Simple follow up surveillance. Electronically Signed   By: Ranell Bring M.D.   On: 04/14/2024 17:28   DG Chest 2 View Result Date: 04/14/2024 CLINICAL DATA:  Sepsis. EXAM: CHEST - 2 VIEW COMPARISON:  X-ray 03/28/2024. FINDINGS: Underinflation. There is some linear opacity lung bases likely scar or  atelectasis. No consolidation, pneumothorax or effusion. No edema. Normal cardiopericardial silhouette. Calcified aorta. Degenerative changes along the spine. There are loops of bowel in the abdomen which have some contrast. Please correlate with known history and prior fluoroscopy. IMPRESSION: Underinflation.  Basilar atelectasis. Electronically Signed   By: Ranell Bring M.D.   On: 04/14/2024 15:25     Nilda Fendt, MD, PhD [facility]  Between 7 am - 7 pm I am available, please contact me via Amion (for emergencies) or Securechat (non urgent messages)  Between 7 pm - 7 am I am not available, please contact night coverage MD/APP via Amion

## 2024-04-15 NOTE — Evaluation (Signed)
 Clinical/Bedside Swallow Evaluation Patient Details  Name: Dustin Hess MRN: [id number] Date of Birth: 02/22/1946  Today's Date: 04/15/2024 Time: SLP Start Time (ACUTE ONLY): 0909 SLP Stop Time (ACUTE ONLY): 0918 SLP Time Calculation (min) (ACUTE ONLY): 9 min  Past Medical History:  Past Medical History:  Diagnosis Date   Anemia, unspecified 12/22/2013   Arthritis    Benign prostatic hypertrophy    Congenital blindness    Coronary artery disease    a. 06/2004 Inf STEMI with PCI/BMS to RCA/LCX;  b. Cath 2014 40-50% focal bradycardia circ stenosis with a patent stent, RCA 50% stenosis, distal 60% stenosis in a previously stented area. 06/2011 Neg MV, EF 67%.   Dyslipidemia    GERD (gastroesophageal reflux disease)    GI bleeding 2006   Hypertension    Hypothyroidism    Leukopenia 12/22/2013   Past Surgical History:  Past Surgical History:  Procedure Laterality Date   COLONOSCOPY     CORONARY ANGIOPLASTY WITH STENT PLACEMENT     ESOPHAGOGASTRODUODENOSCOPY     HEMORRHOID SURGERY     LEFT HEART CATHETERIZATION WITH CORONARY ANGIOGRAM N/A 04/30/2013   Procedure: LEFT HEART CATHETERIZATION WITH CORONARY ANGIOGRAM;  Surgeon: Ozell Fell, MD;  Location: [facility] CATH LAB;  Service: Cardiovascular;  Laterality: N/A;   HPI:  78 y.o. male was brought to the ER after patient was found with fever and weakness with concerns for gallbladder wall thickening and edema. Previously (03/28/24), presented to ER for weakness, not eating well, lethargic with some confusion for the last 2 to 3 days, dx with acute metabolic encephalopathy. PMH history of CAD status post PCI in 2005, hypertension, hypothyroidism, blindness, anemia, BPH .    Assessment / Plan / Recommendation  Clinical Impression  Patient presents with clinical concern for oropharyngeal dysphagia with oral impairments mildly increased compared to objective swallow study completed 7/7 during prior inpatient stay. Due to cognitive  impairments, blindness, and hearing loss, unable to complete full formal oral mechanism exam, however face appeared symmetrical, though suspect generalized oral weakness. Patient NPO for potential procedure, however MD specifically cleared SLP to conduct bedside swallow evaluation, thus provided patient with sips of thin liquids via straw and 3 bites of puree and Dys2 textures total. Similiar to presentation on MBS, patient exhibited multiple audible swallows with with thin liquids, while presentation of heavier solids remains improved compared to thins. Orally, patient exhibited difficulty tolerating Dys2 solids, demonstrating impaired bolus awareness, prolonged oral transit, oral holding, and oral residue. Purees cleared the oral cavity swiftly and in full. Patient will remain NPO for procedure per MD recommendations, however upon diet initiation, recommend Dys1/thin liquids with medications crushed in puree. Given blindness and reduced cognitive function, patient will require full assistance during meals. SLP will continue to follow. SLP Visit Diagnosis: Dysphagia, oropharyngeal phase (R13.12)    Aspiration Risk  Mild aspiration risk    Diet Recommendation Dysphagia 1 (Puree);Thin liquid    Liquid Administration via: Straw;Cup Medication Administration: Crushed with puree Supervision: Full supervision/cueing for compensatory strategies Compensations: Slow rate;Small sips/bites;Follow solids with liquid;Multiple dry swallows after each bite/sip Postural Changes: Seated upright at 90 degrees    Other  Recommendations Oral Care Recommendations: Oral care BID     Assistance Recommended at Discharge    Functional Status Assessment Patient has had a recent decline in their functional status and demonstrates the ability to make significant improvements in function in a reasonable and predictable amount of time.  Frequency and Duration min 2x/week  2 weeks  Prognosis Prognosis for improved  oropharyngeal function: Fair Barriers to Reach Goals: Severity of deficits;Cognitive deficits      Swallow Study   General Date of Onset: 04/14/24 HPI: 78 y.o. male was brought to the ER after patient was found with fever and weakness with concerns for gallbladder wall thickening and edema. Previously (03/28/24), presented to ER for weakness, not eating well, lethargic with some confusion for the last 2 to 3 days, dx with acute metabolic encephalopathy. PMH history of CAD status post PCI in 2005, hypertension, hypothyroidism, blindness, anemia, BPH . Type of Study: Bedside Swallow Evaluation Previous Swallow Assessment: none per chart Diet Prior to this Study: NPO Respiratory Status: Room air History of Recent Intubation: No Behavior/Cognition: Alert;Cooperative;Requires cueing;Doesn't follow directions Oral Cavity Assessment: Within Functional Limits Oral Care Completed by SLP: No Oral Cavity - Dentition: Poor condition;Missing dentition Vision: Impaired for self-feeding Self-Feeding Abilities: Needs assist Patient Positioning: Upright in bed Baseline Vocal Quality: Low vocal intensity Volitional Cough: Cognitively unable to elicit Volitional Swallow: Unable to elicit    Oral/Motor/Sensory Function Overall Oral Motor/Sensory Function: Generalized oral weakness (difficult to fully assess due to impaired cognitive function, blindness, and hearing loss)   Ice Chips Ice chips: Not tested   Thin Liquid Thin Liquid: Impaired Presentation: Cup;Straw Oral Phase Functional Implications: Prolonged oral transit Pharyngeal  Phase Impairments: Multiple swallows    Nectar Thick Nectar Thick Liquid: Not tested   Honey Thick Honey Thick Liquid: Not tested   Puree Puree: Within functional limits Presentation: Spoon   Solid     Solid: Impaired Presentation: Spoon Oral Phase Impairments: Reduced lingual movement/coordination;Poor awareness of bolus;Impaired mastication Oral Phase Functional  Implications: Prolonged oral transit;Impaired mastication;Oral residue;Oral holding Pharyngeal Phase Impairments: Multiple swallows     Rosina Downy, M.A., CCC-SLP  Rosina A Miguelina Fore 04/15/2024,9:36 AM

## 2024-04-16 DIAGNOSIS — K819 Cholecystitis, unspecified: Secondary | ICD-10-CM | POA: Diagnosis not present

## 2024-04-16 LAB — COMPREHENSIVE METABOLIC PANEL WITH GFR
ALT: 232 U/L — ABNORMAL HIGH (ref 0–44)
AST: 108 U/L — ABNORMAL HIGH (ref 15–41)
Albumin: 3.3 g/dL — ABNORMAL LOW (ref 3.5–5.0)
Alkaline Phosphatase: 145 U/L — ABNORMAL HIGH (ref 38–126)
Anion gap: 11 (ref 5–15)
BUN: 18 mg/dL (ref 8–23)
CO2: 25 mmol/L (ref 22–32)
Calcium: 9.6 mg/dL (ref 8.9–10.3)
Chloride: 107 mmol/L (ref 98–111)
Creatinine, Ser: 0.93 mg/dL (ref 0.61–1.24)
GFR, Estimated: >60 mL/min (ref >60)
Glucose, Bld: 100 mg/dL — ABNORMAL HIGH (ref 70–99)
Potassium: 3.6 mmol/L (ref 3.5–5.1)
Sodium: 143 mmol/L (ref 135–145)
Total Bilirubin: 2.3 mg/dL — ABNORMAL HIGH (ref 0.0–1.2)
Total Protein: 7.1 g/dL (ref 6.5–8.1)

## 2024-04-16 LAB — CBC
HCT: 41.5 % (ref 39.0–52.0)
Hemoglobin: 13.7 g/dL (ref 13.0–17.0)
MCH: 31.2 pg (ref 26.0–34.0)
MCHC: 33 g/dL (ref 30.0–36.0)
MCV: 94.5 fL (ref 80.0–100.0)
Platelets: 133 K/uL — ABNORMAL LOW (ref 150–400)
RBC: 4.39 MIL/uL (ref 4.22–5.81)
RDW: 11.7 % (ref 11.5–15.5)
WBC: 5.2 K/uL (ref 4.0–10.5)
nRBC: 0 % (ref 0.0–0.2)

## 2024-04-16 LAB — GLUCOSE, CAPILLARY
Glucose-Capillary: 100 mg/dL — ABNORMAL HIGH (ref 70–99)
Glucose-Capillary: 102 mg/dL — ABNORMAL HIGH (ref 70–99)
Glucose-Capillary: 129 mg/dL — ABNORMAL HIGH (ref 70–99)
Glucose-Capillary: 142 mg/dL — ABNORMAL HIGH (ref 70–99)
Glucose-Capillary: 67 mg/dL — ABNORMAL LOW (ref 70–99)
Glucose-Capillary: 92 mg/dL (ref 70–99)
Glucose-Capillary: 98 mg/dL (ref 70–99)

## 2024-04-16 LAB — T3: T3, Total: 72 ng/dL (ref 71–180)

## 2024-04-16 LAB — LEGIONELLA PNEUMOPHILA SEROGP 1 UR AG: L. pneumophila Serogp 1 Ur Ag: NEGATIVE

## 2024-04-16 LAB — MAGNESIUM: Magnesium: 1.8 mg/dL (ref 1.7–2.4)

## 2024-04-16 MED ORDER — POLYETHYLENE GLYCOL 3350 17 G PO PACK
17.0000 g | PACK | Freq: Two times a day (BID) | ORAL | Status: DC
  Administered 2024-04-16 – 2024-04-23 (×12): 17 g via ORAL
  Filled 2024-04-16 (×13): qty 1

## 2024-04-16 NOTE — TOC Progression Note (Signed)
 Transition of Care [facility]) - Progression Note    Patient Details  Name: Dustin Hess MRN: [id number] Date of Birth: 07/18/1946  Transition of [facility] CM/SW Contact  Micala Saltsman SHAUNNA [place], [place] Phone Number: 04/16/2024, 12:59 PM  Clinical Narrative:     CSW called pt's spouse who explains pt has been at [facility] for about 1 week prior to admission. She is agreeable to pt returning to rehab at [facility] when medically stable. Will need new therapy notes to submit to insurance for SNF authorization.                Social Determinants of Health (SDOH) Interventions SDOH Screenings   Food Insecurity: No Food Insecurity (04/15/2024)  Housing: Unknown (04/15/2024)  Transportation Needs: No Transportation Needs (04/15/2024)  Utilities: Not At Risk (04/15/2024)  Social Connections: Socially Isolated (04/15/2024)  Tobacco Use: Low Risk  (04/14/2024)    Readmission Risk Interventions     No data to display

## 2024-04-16 NOTE — Progress Notes (Signed)
 SLP Cancellation Note  Patient Details Name: Dustin Hess MRN: [id number] DOB: [birth year]   Cancelled treatment:       Reason Eval/Treat Not Completed: Patient not medically ready  Pt remains NPO from a medical standpoint this morning. Will hold POs this am and f/u once diet is initiated, but note recommendations from SLP on previous date if he is appropriate to begin a PO diet:   Dysphagia 1 (Puree);Thin liquid     Liquid Administration via: Straw;Cup Medication Administration: Crushed with puree Supervision: Full supervision/cueing for compensatory strategies Compensations: Slow rate;Small sips/bites;Follow solids with liquid;Multiple dry swallows after each bite/sip Postural Changes: Seated upright at 90 degrees     Dustin Hess., M.A. CCC-SLP Acute Rehabilitation Services Office: [phone]  Secure chat preferred  04/16/2024, 8:47 AM

## 2024-04-16 NOTE — Progress Notes (Addendum)
 Progress Note     Subjective: Pt is awake. Oriented to person, [place], 2025, and says he is in the hospital because his family said he was less responsive at home. Denies abdominal pain. Asking for bed pan to have a bowel movement. Says he lives at home with wife/daughter (per chart review he was at [facility]).   Objective: Vital signs in last 24 hours: Temp:  [97.8 F (36.6 C)-98.7 F (37.1 C)] 98.3 F (36.8 C) (07/18 0522) Pulse Rate:  [78-101] 96 (07/18 0522) Resp:  [12-18] 18 (07/18 0522) BP: (126-167)/(76-87) 163/85 (07/18 0522) SpO2:  [99 %-100 %] 99 % (07/18 0522) Last BM Date : 04/15/24  Intake/Output from previous day: 07/17 0701 - 07/18 0700 In: 1062.8 [I.V.:720.6; IV Piggyback:342.1] Out: 150 [Urine:150] Intake/Output this shift: No intake/output data recorded.  PE: General: WD, elderly male who is laying in bed in NAD Heart: regular, rate, and rhythm.   Lungs: No wheezes, rhonchi, or rales noted.  Respiratory effort nonlabored Abd: soft, NT, ND, no masses, hernias, or organomegaly   Lab Results:  Recent Labs    04/15/24 0257 04/16/24 0559  WBC 8.1 5.2  HGB 12.5* 13.7  HCT 37.8* 41.5  PLT 135* 133*   BMET Recent Labs    04/15/24 0257 04/16/24 0559  NA 141 143  K 3.8 3.6  CL 109 107  CO2 24 25  GLUCOSE 90 100*  BUN 18 18  CREATININE 0.91 0.93  CALCIUM  9.3 9.6   PT/INR Recent Labs    04/14/24 1343  LABPROT 16.4*  INR 1.3*   CMP     Component Value Date/Time   NA 143 04/16/2024 0559   NA 147 (H) 08/09/2019 0830   NA 143 04/07/2014 1221   K 3.6 04/16/2024 0559   K 4.3 04/07/2014 1221   CL 107 04/16/2024 0559   CO2 25 04/16/2024 0559   CO2 24 04/07/2014 1221   GLUCOSE 100 (H) 04/16/2024 0559   GLUCOSE 105 04/07/2014 1221   BUN 18 04/16/2024 0559   BUN 17 08/09/2019 0830   BUN 19.1 04/07/2014 1221   CREATININE 0.93 04/16/2024 0559   CREATININE 1.0 04/07/2014 1221   CALCIUM  9.6 04/16/2024 0559   CALCIUM  9.0 04/07/2014  1221   PROT 7.1 04/16/2024 0559   PROT 7.0 08/09/2019 0830   PROT 7.2 04/07/2014 1221   ALBUMIN 3.3 (L) 04/16/2024 0559   ALBUMIN 4.3 08/09/2019 0830   ALBUMIN 3.8 04/07/2014 1221   AST 108 (H) 04/16/2024 0559   AST 19 04/07/2014 1221   ALT 232 (H) 04/16/2024 0559   ALT 21 04/07/2014 1221   ALKPHOS 145 (H) 04/16/2024 0559   ALKPHOS 60 04/07/2014 1221   BILITOT 2.3 (H) 04/16/2024 0559   BILITOT 0.4 08/09/2019 0830   BILITOT 0.92 04/07/2014 1221   GFRNONAA >60 04/16/2024 0559   GFRAA 71 08/09/2019 0830   Lipase     Component Value Date/Time   LIPASE 28 04/14/2024 1939       Studies/Results: ECHOCARDIOGRAM COMPLETE Result Date: 04/15/2024    ECHOCARDIOGRAM REPORT   Patient Name:   Dustin Hess Date of Exam: 04/15/2024 Medical Rec #:  [id number]              Height:       63.0 in Accession #:    [id number]             Weight:       142.0 lb Date of Birth:  Feb 12, 1946  BSA:          1.672 m Patient Age:    78 years               BP:           152/79 mmHg Patient Gender: M                      HR:           75 bpm. Exam Location:  Inpatient Procedure: 2D Echo, Cardiac Doppler and Color Doppler (Both Spectral and Color            Flow Doppler were utilized during procedure). Indications:    CHF  History:        Patient has prior history of Echocardiogram examinations, most                 recent 10/03/2021. Risk Factors:Hypertension and Dyslipidemia.  Sonographer:    Philomena Daring Referring Phys: [id number] ANASTASSIA DOUTOVA  Sonographer Comments: Technically difficult study due to poor echo windows. Image acquisition challenging due to patient body habitus. IMPRESSIONS  1. Hyperdynamic LV, not well interrogated but appears to be an intercavitary gradient of around . Left ventricular ejection fraction, by estimation, is 70 to 75%. The left ventricle has hyperdynamic function. The left ventricle has no regional wall motion abnormalities. Left ventricular diastolic parameters  are consistent with Grade I diastolic dysfunction (impaired relaxation).  2. Right ventricular systolic function is normal. The right ventricular size is normal.  3. The mitral valve is normal in structure. No evidence of mitral valve regurgitation. No evidence of mitral stenosis.  4. The aortic valve is normal in structure. Aortic valve regurgitation is not visualized. No aortic stenosis is present.  5. The inferior vena cava is normal in size with greater than 50% respiratory variability, suggesting right atrial pressure of 3 mmHg. FINDINGS  Left Ventricle: Hyperdynamic LV, not well interrogated but appears to be an intercavitary gradient of around . Left ventricular ejection fraction, by estimation, is 70 to 75%. The left ventricle has hyperdynamic function. The left ventricle has no  regional wall motion abnormalities. The left ventricular internal cavity size was normal in size. There is no left ventricular hypertrophy. Left ventricular diastolic parameters are consistent with Grade I diastolic dysfunction (impaired relaxation). Right Ventricle: The right ventricular size is normal. No increase in right ventricular wall thickness. Right ventricular systolic function is normal. Left Atrium: Left atrial size was normal in size. Right Atrium: Right atrial size was normal in size. Pericardium: There is no evidence of pericardial effusion. Mitral Valve: The mitral valve is normal in structure. No evidence of mitral valve regurgitation. No evidence of mitral valve stenosis. Tricuspid Valve: The tricuspid valve is normal in structure. Tricuspid valve regurgitation is not demonstrated. No evidence of tricuspid stenosis. Aortic Valve: The aortic valve is normal in structure. Aortic valve regurgitation is not visualized. No aortic stenosis is present. Pulmonic Valve: The pulmonic valve was normal in structure. Pulmonic valve regurgitation is not visualized. No evidence of pulmonic stenosis. Aorta: The aortic root  is normal in size and structure. Venous: The inferior vena cava is normal in size with greater than 50% respiratory variability, suggesting right atrial pressure of 3 mmHg. IAS/Shunts: No atrial level shunt detected by color flow Doppler.  LEFT VENTRICLE PLAX 2D LVIDd:         4.00 cm   Diastology LVIDs:         2.50 cm  LV e' medial:    4.46 cm/s LV PW:         1.10 cm   LV E/e' medial:  11.9 LV IVS:        0.90 cm   LV e' lateral:   4.90 cm/s LVOT diam:     1.90 cm   LV E/e' lateral: 10.8 LV SV:         82 LV SV Index:   49 LVOT Area:     2.84 cm  RIGHT VENTRICLE             IVC RV S prime:     13.70 cm/s  IVC diam: 1.40 cm TAPSE (M-mode): 1.7 cm LEFT ATRIUM             Index        RIGHT ATRIUM          Index LA diam:        2.90 cm 1.73 cm/m   RA Area:     8.55 cm LA Vol (A2C):   26.0 ml 15.55 ml/m  RA Volume:   16.10 ml 9.63 ml/m LA Vol (A4C):   29.4 ml 17.59 ml/m LA Biplane Vol: 28.9 ml 17.29 ml/m  AORTIC VALVE LVOT Vmax:   135.00 cm/s LVOT Vmean:  90.700 cm/s LVOT VTI:    0.289 m  AORTA Ao Root diam: 2.80 cm MITRAL VALVE MV Area (PHT): 3.77 cm    SHUNTS MV Decel Time: 201 msec    Systemic VTI:  0.29 m MV E velocity: 52.90 cm/s  Systemic Diam: 1.90 cm MV A velocity: 96.90 cm/s MV E/A ratio:  0.55 Morene Brownie Electronically signed by Morene Brownie Signature Date/Time: 04/15/2024/6:09:34 PM    Final    MR ABDOMEN MRCP W WO CONTAST Result Date: 04/15/2024 CLINICAL DATA:  Gallbladder wall thickening/edema and cholelithiasis. Elevated liver function tests. EXAM: MRI ABDOMEN WITHOUT AND WITH CONTRAST (INCLUDING MRCP) TECHNIQUE: Multiplanar multisequence MR imaging of the abdomen was performed both before and after the administration of intravenous contrast. Heavily T2-weighted images of the biliary and pancreatic ducts were obtained, and three-dimensional MRCP images were rendered by post processing. CONTRAST:  6mL GADAVIST  GADOBUTROL  1 MMOL/ML IV SOLN COMPARISON:  Multiple exams, including CT and  ultrasound exams from 04/14/2024 FINDINGS: Despite efforts by the technologist and patient, motion artifact is present on today's exam and could not be eliminated. This reduces exam sensitivity and specificity. Lower chest: Hazy signal posteriorly in the lower lobes, right greater than left, corresponding with the ground-glass opacities on CT scan. Hepatobiliary: Abnormal gallbladder wall thickening with layering small gallstones in the gallbladder. No biliary dilatation or definite filling defect in the common bile duct or common hepatic duct. No significant abnormal focal hepatic lesion is identified. Pancreas:  Unremarkable Spleen:  Unremarkable Adrenals/Urinary Tract: 1.2 cm benign Bosniak category 2 cyst of the right kidney upper pole has a single thin internal septation. Benign Bosniak category 1 cyst of the left kidney upper pole measuring 0.8 cm in diameter on image 20 series 12. Benign 0.7 cm left kidney lower pole Bosniak category 1 cyst. No further imaging workup of these lesions is indicated. Adrenal glands unremarkable. Stomach/Bowel: Prominent stool throughout the colon favors constipation. Vascular/Lymphatic: Atherosclerosis is present, including aortoiliac atherosclerotic disease. 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 Other: Trace nonspecific edema deep to both iliacus muscles in the pelvis. Musculoskeletal: Mild lumbar spondylosis and degenerative disc disease. IMPRESSION:  1. Abnormal gallbladder wall thickening with layering small gallstones in the gallbladder. Acute cholecystitis not excluded. No biliary dilatation or definite filling defect in the common bile duct or common hepatic duct. 2. Prominent stool throughout the colon favors constipation. 3. 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 4. Hazy signal posteriorly in the lower lobes, right  greater than left, corresponding with the ground-glass opacities on CT scan. 5. Mild lumbar spondylosis and degenerative disc disease. 6. Trace nonspecific edema deep to both iliacus muscles in the pelvis. Electronically Signed   By: Ryan Salvage M.D.   On: 04/15/2024 16:12   MR 3D Recon At Scanner Result Date: 04/15/2024 CLINICAL DATA:  Gallbladder wall thickening/edema and cholelithiasis. Elevated liver function tests. EXAM: MRI ABDOMEN WITHOUT AND WITH CONTRAST (INCLUDING MRCP) TECHNIQUE: Multiplanar multisequence MR imaging of the abdomen was performed both before and after the administration of intravenous contrast. Heavily T2-weighted images of the biliary and pancreatic ducts were obtained, and three-dimensional MRCP images were rendered by post processing. CONTRAST:  6mL GADAVIST  GADOBUTROL  1 MMOL/ML IV SOLN COMPARISON:  Multiple exams, including CT and ultrasound exams from 04/14/2024 FINDINGS: Despite efforts by the technologist and patient, motion artifact is present on today's exam and could not be eliminated. This reduces exam sensitivity and specificity. Lower chest: Hazy signal posteriorly in the lower lobes, right greater than left, corresponding with the ground-glass opacities on CT scan. Hepatobiliary: Abnormal gallbladder wall thickening with layering small gallstones in the gallbladder. No biliary dilatation or definite filling defect in the common bile duct or common hepatic duct. No significant abnormal focal hepatic lesion is identified. Pancreas:  Unremarkable Spleen:  Unremarkable Adrenals/Urinary Tract: 1.2 cm benign Bosniak category 2 cyst of the right kidney upper pole has a single thin internal septation. Benign Bosniak category 1 cyst of the left kidney upper pole measuring 0.8 cm in diameter on image 20 series 12. Benign 0.7 cm left kidney lower pole Bosniak category 1 cyst. No further imaging workup of these lesions is indicated. Adrenal glands unremarkable. Stomach/Bowel:  Prominent stool throughout the colon favors constipation. Vascular/Lymphatic: Atherosclerosis is present, including aortoiliac atherosclerotic disease. 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 Other: Trace nonspecific edema deep to both iliacus muscles in the pelvis. Musculoskeletal: Mild lumbar spondylosis and degenerative disc disease. IMPRESSION: 1. Abnormal gallbladder wall thickening with layering small gallstones in the gallbladder. Acute cholecystitis not excluded. No biliary dilatation or definite filling defect in the common bile duct or common hepatic duct. 2. Prominent stool throughout the colon favors constipation. 3. 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 4. Hazy signal posteriorly in the lower lobes, right greater than left, corresponding with the ground-glass opacities on CT scan. 5. Mild lumbar spondylosis and degenerative disc disease. 6. Trace nonspecific edema deep to both iliacus muscles in the pelvis. Electronically Signed   By: Ryan Salvage M.D.   On: 04/15/2024 16:12   US  Abdomen Limited RUQ (LIVER/GB) Result Date: 04/14/2024 CLINICAL DATA:  Cholecystitis EXAM: ULTRASOUND ABDOMEN LIMITED RIGHT UPPER QUADRANT COMPARISON:  Ultrasound 03/29/2024.  CT 04/14/2024 earlier FINDINGS: Gallbladder: Dilated gallbladder with significant wall thickening, new from previous. Dependent stones and tumefactive sludge. No reported Murphy's sign reported however the patient was not awake. Wall thickness approaches 7 mm. Common bile duct: Diameter: 2 mm Liver: No focal lesion identified. Within  normal limits in parenchymal echogenicity. Portal vein is patent on color Doppler imaging with normal direction of blood flow towards the liver. Other: None. IMPRESSION: New gallbladder wall thickening and edema. Stones and sludge identified. Please correlate  for other clinical findings of acute cholecystitis. Confirmatory HIDA scan could be considered as clinically appropriate. No biliary ductal dilatation. Electronically Signed   By: Ranell Bring M.D.   On: 04/14/2024 18:23   CT ABDOMEN PELVIS W CONTRAST Result Date: 04/14/2024 CLINICAL DATA:  Fever. Fatigue. Previous history of recent hospital stay for sepsis and dehydration. Abnormal liver function tests EXAM: CT ABDOMEN AND PELVIS WITH CONTRAST TECHNIQUE: Multidetector CT imaging of the abdomen and pelvis was performed using the standard protocol following bolus administration of intravenous contrast. RADIATION DOSE REDUCTION: This exam was performed according to the departmental dose-optimization program which includes automated exposure control, adjustment of the mA and/or kV according to patient size and/or use of iterative reconstruction technique. CONTRAST:  65mL OMNIPAQUE  IOHEXOL  350 MG/ML SOLN COMPARISON:  Ultrasound abdomen 03/29/2024. Renal stone CT 03/28/2024. Recent swallowing study of 04/05/2024 FINDINGS: Lower chest: Mild opacity along the lung bases. Acute infiltrate is possible. Recommend follow-up. No pleural effusion. Breathing motion. Scattered vascular calcifications including along the coronary arteries. Hepatobiliary: Dilated gallbladder with wall thickening and wall edema. Stones are identified. Please correlate for other clinical evidence of acute cholecystitis. No space-occupying liver lesion. Patent portal vein. Pancreas: Mild global atrophy of the pancreas. No obvious enhancing mass. Spleen: Spleen is nonenlarged. Adrenals/Urinary Tract: Minimal thickening of the lateral limb of the right adrenal gland, nonspecific. Left adrenal glands preserved. Global bilateral renal atrophy. Upper pole Bosniak 1 right-sided small renal cyst. There are bilateral nonobstructing renal stones. These were seen previously. Example on the right measures 10 mm and left 8 mm. Other foci identified as well.  Ureters have normal course and caliber extending down to the urinary bladder. Preserved contour to the urinary bladder. Slightly prominent prostate. Stomach/Bowel: There is contrast seen throughout the colon, likely did to previous swallowing study but please correlate with history. Large bowel is nondilated. Scattered colonic stool. Normal retrocecal appendix. The stomach has some luminal fluid. Small bowel is nondilated. Vascular/Lymphatic: Normal caliber IVC. Diffuse vascular calcifications identified including along the aorta and iliac vessels. 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 Slightly saccular in appearance. Second smaller area more caudal as well with what may be a non flow-limiting short segment dissection. Please correlate with history. Simple attention on follow-up. Reproductive: Heterogeneous enlarged prostate. Please correlate with the BPH changes and patient's PSA. Other: No free air or free fluid. Musculoskeletal: Scattered degenerative changes. Multilevel Schmorl's node deformities. Posterior multilevel disc bulging and posterior osteophytes. IMPRESSION: New mild areas of opacity along the lung bases, right greater left. Subtle infiltrate is possible. Recommend follow-up. Dilated gallbladder with slight wall thickening and some wall edema, new from previous. There are stones as well. Please correlate for any clinical evidence of acute cholecystitis and if needed additional workup such as HIDA scan. No bowel obstruction. Contrast seen along the course of the colon. Normal appendix. Bilateral nonobstructing renal stones. Mild ectasia of the abdominal aorta with some saccular areas. Simple follow up surveillance. Electronically Signed   By: Ranell Bring M.D.   On: 04/14/2024 17:28   DG Chest 2 View Result Date: 04/14/2024 CLINICAL DATA:  Sepsis. EXAM: CHEST - 2 VIEW COMPARISON:  X-ray 03/28/2024. FINDINGS: Underinflation. There is some linear  opacity lung bases  likely scar or atelectasis. No consolidation, pneumothorax or effusion. No edema. Normal cardiopericardial silhouette. Calcified aorta. Degenerative changes along the spine. There are loops of bowel in the abdomen which have some contrast. Please correlate with known history and prior fluoroscopy. IMPRESSION: Underinflation.  Basilar atelectasis. Electronically Signed   By: Ranell Bring M.D.   On: 04/14/2024 15:25    Anti-infectives: Anti-infectives (From admission, onward)    Start     Dose/Rate Route Frequency Ordered Stop   04/15/24 1800  azithromycin  (ZITHROMAX ) 500 mg in sodium chloride  0.9 % 250 mL IVPB        500 mg 250 mL/hr over 60 Minutes Intravenous Every 24 hours 04/14/24 2102     04/15/24 0200  piperacillin -tazobactam (ZOSYN ) IVPB 3.375 g        3.375 g 12.5 mL/hr over 240 Minutes Intravenous Every 8 hours 04/14/24 2105     04/14/24 1745  piperacillin -tazobactam (ZOSYN ) IVPB 3.375 g        3.375 g 100 mL/hr over 30 Minutes Intravenous  Once 04/14/24 1744 04/14/24 1913         Latest Ref Rng & Units 04/16/2024    5:59 AM 04/15/2024    2:57 AM 04/14/2024    7:39 PM  Hepatic Function  Total Protein 6.5 - 8.1 g/dL 7.1  6.1    Albumin 3.5 - 5.0 g/dL 3.3  3.1    AST 15 - 41 U/L 108  224    ALT 0 - 44 U/L 232  319    Alk Phosphatase 38 - 126 U/L 145  154    Total Bilirubin 0.0 - 1.2 mg/dL 2.3  3.5    Bilirubin, Direct 0.0 - 0.2 mg/dL   2.1      Assessment/Plan  Fever Elevated LFTs with hyperbilirubinemia Abnormal gallbladder on imaging - CT 7/16 with dilated gallbladder with some wall thickening and edema and cholelithiasis - RUQ US  with some gallbladder wall thickening and edema - Patient is non-tender on exam, afebrile last 48 hours and no leukocytosis - MRCP 7/17 w. Gallbladder wall thickening and sludge, no biliary dilation. Evidence of constipation. - LFTs are all improving as above, LFTs were normal 2 weeks ago. This is suspicious for passing  small gallstones/choledocholithiasis.  - clinically patient is non-tender in the RUQ, no leukocytosis, fever resolved. Will discuss final plan with my attending: non-operative mgmt with antibiotics alone, given patients baseline frailty and he is asymptomatic, vs consideration of cholecystectomy for symptomatic cholelithiasis.  FEN: NPO, IVF per [facility] VTE: ok to have SQH or LMWH from surgery standpoint ID: azithromycin /zosyn   - per [facility] -  CAD s/p remote stent placement HTN Hypothyroidism GERD BPH Congenital blindness   LOS: 2 days   I reviewed ED provider notes, hospitalist notes, last 24 h vitals and pain scores, last 48 h intake and output, last 24 h labs and trends, and last 24 h imaging results.  This care required moderate level of medical decision making.    Dustin GORMAN Pringle, PA-C  [facility] 04/16/2024, 8:30 AM Please see Amion for pager number during day hours 7:00am-4:30pm

## 2024-04-16 NOTE — Plan of Care (Signed)
   Problem: Activity: Goal: Risk for activity intolerance will decrease Outcome: Progressing   Problem: Nutrition: Goal: Adequate nutrition will be maintained Outcome: Progressing   Problem: Coping: Goal: Level of anxiety will decrease Outcome: Progressing

## 2024-04-16 NOTE — Progress Notes (Signed)
 PROGRESS NOTE  Dustin Hess FMW:[id number] DOB: 1945/10/16 DOA: 04/14/2024 PCP: [name], MD   LOS: 2 days   Brief Narrative / Interim history: 78 year old male with history of CAD with PCI in 2005, HTN, hypothyroidism, blindness who comes in from [facility] with increased fatigue, low-grade fever.  Patient had abdominal pain the day prior without nausea or vomiting.  There is no reported cough or chest congestion.  CT scan of the abdomen and pelvis on admission showed possible pulmonary infiltrates as well as dilated gallbladder with some wall edema.  Surgery was consulted, he was placed on antibiotics and admitted to the hospital  Subjective / 24h Interval events: He is awake this morning, alert.  He denies any abdominal pain, no nausea or vomiting.  Assesement and Plan: Principal Problem:   Cholecystitis Active Problems:   Hypothyroidism   Essential hypertension, benign   Benign prostatic hyperplasia   CAD (coronary artery disease) of artery bypass graft   Dyslipidemia   Acute cholecystitis   CAP (community acquired pneumonia)  Principal problem Cholelithiasis, concern for cholecystitis -no significant abdominal pain, gallbladder ultrasound showed stones and sludge.  His LFTs are elevated, and bilirubin is up too, I wonder whether he has been passing some stones.  Surgery following, underwent an MRI/MRCP which showed again raise concern for cholecystitis and there were no signs of choledocholithiasis - Discussed with general surgery team, they recommend patient be treated conservatively with antibiotics - Allow diet  Active problems Possible pneumonia -he does not have any cough or respiratory symptoms.  Continue antibiotics, will treat empirically  CAD-no chest pain, continue metoprolol , Imdur   Hypothyroidism-continue Synthroid   BPH-continue tamsulosin   Scheduled Meds:  isosorbide  mononitrate  30 mg Oral Daily   levothyroxine   88 mcg Oral QAC breakfast    metoprolol  succinate  25 mg Oral Daily   tamsulosin   0.8 mg Oral Daily   Continuous Infusions:  azithromycin  Stopped (04/15/24 1802)   dextrose  5% lactated ringers  75 mL/hr at 04/16/24 0209   piperacillin -tazobactam (ZOSYN )  IV 3.375 g (04/16/24 0508)   PRN Meds:.acetaminophen  **OR** acetaminophen , bisacodyl , fentaNYL  (SUBLIMAZE ) injection, HYDROcodone -acetaminophen , ondansetron  **OR** ondansetron  (ZOFRAN ) IV  Current Outpatient Medications  Medication Instructions   acetaminophen  (TYLENOL ) 650 mg, Oral, Every 8 hours PRN   aspirin  81 mg, Daily   atorvastatin  (LIPITOR) 20 mg, Daily   Cholecalciferol (VITAMIN D3) 1000 UNITS CAPS 1 capsule, Daily   dutasteride  (AVODART ) 0.5 mg, Daily   ferrous sulfate  325 mg, Oral, Daily with breakfast   isosorbide  mononitrate (IMDUR ) 30 mg, Oral, Daily   levothyroxine  (SYNTHROID ) 88 mcg, Daily before breakfast   metoprolol  succinate (TOPROL -XL) 25 mg, Oral, Daily, TAKE WITH OR IMMEDIATELY FOLLOWING A MEAL.   Multiple Vitamin (MULTIVITAMIN) tablet 1 tablet, Daily   nitroGLYCERIN  (NITROSTAT ) 0.4 mg, Sublingual, Every 5 min PRN, Must call and schedule appt for future refills   senna-docusate (SENOKOT-S) 8.6-50 MG tablet 1 tablet, Oral, Daily   tamsulosin  (FLOMAX ) 0.8 mg, Daily    Diet Orders (From admission, onward)     Start     Ordered   04/16/24 0906  Diet Heart Room service appropriate? Yes; Fluid consistency: Thin  Diet effective now       Question Answer Comment  Room service appropriate? Yes   Fluid consistency: Thin      04/16/24 0905            DVT prophylaxis: SCDs Start: 04/14/24 2129   Lab Results  Component Value Date   PLT 133 (  L) 04/16/2024      Code Status: Full Code  Family Communication: no family at bedside   Status is: Inpatient Remains inpatient appropriate because: severity of illness  Level of care: Telemetry Medical  Consultants:  General surgery   Objective: Vitals:   04/15/24 2230 04/16/24 0041  04/16/24 0522 04/16/24 0834  BP: (!) 159/87 (!) 167/87 (!) 163/85 (!) 198/97  Pulse: 91 (!) 101 96 98  Resp: 16 18 18 16   Temp: 98.5 F (36.9 C) 98.7 F (37.1 C) 98.3 F (36.8 C) 97.6 F (36.4 C)  TempSrc: Oral Oral Oral Oral  SpO2:  100% 99% 100%  Weight:      Height:        Intake/Output Summary (Last 24 hours) at 04/16/2024 1021 Last data filed at 04/16/2024 9096 Gross per 24 hour  Intake 399.85 ml  Output 250 ml  Net 149.85 ml   Wt Readings from Last 3 Encounters:  04/14/24 64.4 kg  03/28/24 64.4 kg  02/24/24 64.4 kg    Examination:  Constitutional: NAD Eyes: lids and conjunctivae normal, no scleral icterus ENMT: mmm Neck: normal, supple Respiratory: clear to auscultation bilaterally, no wheezing, no crackles. Normal respiratory effort.  Cardiovascular: Regular rate and rhythm, no murmurs / rubs / gallops. No LE edema. Abdomen: soft, no distention, no tenderness. Bowel sounds positive.    Data Reviewed: I have independently reviewed following labs and imaging studies   CBC Recent Labs  Lab 04/14/24 1343 04/15/24 0257 04/16/24 0559  WBC 8.6 8.1 5.2  HGB 13.7 12.5* 13.7  HCT 41.1 37.8* 41.5  PLT 159 135* 133*  MCV 95.4 94.7 94.5  MCH 31.8 31.3 31.2  MCHC 33.3 33.1 33.0  RDW 11.6 11.8 11.7  LYMPHSABS 0.2*  --   --   MONOABS 0.6  --   --   EOSABS 0.0  --   --   BASOSABS 0.0  --   --     Recent Labs  Lab 04/14/24 1343 04/14/24 1440 04/14/24 1537 04/15/24 0257 04/16/24 0559  NA 143  --   --  141 143  K 3.8  --   --  3.8 3.6  CL 109  --   --  109 107  CO2 23  --   --  24 25  GLUCOSE 109*  --   --  90 100*  BUN 20  --   --  18 18  CREATININE 0.87  --   --  0.91 0.93  CALCIUM  9.5  --   --  9.3 9.6  AST 488*  --   --  224* 108*  ALT 368*  --   --  319* 232*  ALKPHOS 155*  --   --  154* 145*  BILITOT 3.3*  --   --  3.5* 2.3*  ALBUMIN 3.6  --   --  3.1* 3.3*  MG  --   --   --  2.0 1.8  PROCALCITON  --   --   --  0.13  --   LATICACIDVEN  --  0.9  0.7  --   --   INR 1.3*  --   --   --   --   TSH  --   --   --  2.168  --   HGBA1C  --   --   --  4.6*  --     ------------------------------------------------------------------------------------------------------------------ No results for input(s): CHOL, HDL, LDLCALC, TRIG, CHOLHDL, LDLDIRECT in the last 72 hours.  Lab Results  Component Value Date   HGBA1C 4.6 (L) 04/15/2024   ------------------------------------------------------------------------------------------------------------------ Recent Labs    04/15/24 0257  TSH 2.168    Cardiac Enzymes No results for input(s): CKMB, TROPONINI, MYOGLOBIN in the last 168 hours.  Invalid input(s): CK ------------------------------------------------------------------------------------------------------------------    Component Value Date/Time   BNP 297.9 (H) 08/28/2023 1744    CBG: Recent Labs  Lab 04/15/24 2255 04/16/24 0043 04/16/24 0220 04/16/24 0537 04/16/24 0923  GLUCAP 73 67* 92 102* 100*    Recent Results (from the past 240 hours)  Culture, blood (Routine x 2)     Status: None (Preliminary result)   Collection Time: 04/14/24  2:50 PM   Specimen: BLOOD RIGHT HAND  Result Value Ref Range Status   Specimen Description BLOOD RIGHT HAND  Final   Special Requests   Final    AEROBIC BOTTLE ONLY Blood Culture results may not be optimal due to an inadequate volume of blood received in culture bottles   Culture   Final    NO GROWTH 2 DAYS Performed at [facility], [address], [place], [place] [address]    Report Status PENDING  Incomplete  Resp panel by RT-PCR (RSV, Flu A&B, Covid) Anterior Nasal Swab     Status: None   Collection Time: 04/14/24  3:11 PM   Specimen: Anterior Nasal Swab  Result Value Ref Range Status   SARS Coronavirus 2 by RT PCR NEGATIVE NEGATIVE Final   Influenza A by PCR NEGATIVE NEGATIVE Final   Influenza B by PCR NEGATIVE NEGATIVE Final    Comment: (NOTE) The  Xpert Xpress SARS-CoV-2/FLU/RSV plus assay is intended as an aid in the diagnosis of influenza from Nasopharyngeal swab specimens and should not be used as a sole basis for treatment. Nasal washings and aspirates are unacceptable for Xpert Xpress SARS-CoV-2/FLU/RSV testing.  Fact Sheet for Patients: BloggerCourse.com  Fact Sheet for Healthcare Providers: SeriousBroker.it  This test is not yet approved or cleared by the [place]  FDA and has been authorized for detection and/or diagnosis of SARS-CoV-2 by FDA under an Emergency Use Authorization (EUA). This EUA will remain in effect (meaning this test can be used) for the duration of the COVID-19 declaration under Section 564(b)(1) of the Act, 21 U.S.C. section 360bbb-3(b)(1), unless the authorization is terminated or revoked.     Resp Syncytial Virus by PCR NEGATIVE NEGATIVE Final    Comment: (NOTE) Fact Sheet for Patients: BloggerCourse.com  Fact Sheet for Healthcare Providers: SeriousBroker.it  This test is not yet approved or cleared by the [place]  FDA and has been authorized for detection and/or diagnosis of SARS-CoV-2 by FDA under an Emergency Use Authorization (EUA). This EUA will remain in effect (meaning this test can be used) for the duration of the COVID-19 declaration under Section 564(b)(1) of the Act, 21 U.S.C. section 360bbb-3(b)(1), unless the authorization is terminated or revoked.  Performed at [facility], [address], [place], [place] [address]   Culture, blood (Routine x 2)     Status: None (Preliminary result)   Collection Time: 04/15/24  4:15 AM   Specimen: BLOOD  Result Value Ref Range Status   Specimen Description BLOOD SITE NOT SPECIFIED  Final   Special Requests   Final    BOTTLES DRAWN AEROBIC AND ANAEROBIC Blood Culture adequate volume   Culture   Final    NO GROWTH 1  DAY Performed at [facility], [address], [place], [place] [address]  Report Status PENDING  Incomplete     Radiology Studies: ECHOCARDIOGRAM COMPLETE Result Date: 04/15/2024    ECHOCARDIOGRAM REPORT   Patient Name:   Dustin Hess Date of Exam: 04/15/2024 Medical Rec #:  [id number]              Height:       63.0 in Accession #:    [id number]             Weight:       142.0 lb Date of Birth:  08/25/1946              BSA:          1.672 m Patient Age:    78 years               BP:           152/79 mmHg Patient Gender: M                      HR:           75 bpm. Exam Location:  Inpatient Procedure: 2D Echo, Cardiac Doppler and Color Doppler (Both Spectral and Color            Flow Doppler were utilized during procedure). Indications:    CHF  History:        Patient has prior history of Echocardiogram examinations, most                 recent 10/03/2021. Risk Factors:Hypertension and Dyslipidemia.  Sonographer:    Philomena Daring Referring Phys: [id number] ANASTASSIA DOUTOVA  Sonographer Comments: Technically difficult study due to poor echo windows. Image acquisition challenging due to patient body habitus. IMPRESSIONS  1. Hyperdynamic LV, not well interrogated but appears to be an intercavitary gradient of around . Left ventricular ejection fraction, by estimation, is 70 to 75%. The left ventricle has hyperdynamic function. The left ventricle has no regional wall motion abnormalities. Left ventricular diastolic parameters are consistent with Grade I diastolic dysfunction (impaired relaxation).  2. Right ventricular systolic function is normal. The right ventricular size is normal.  3. The mitral valve is normal in structure. No evidence of mitral valve regurgitation. No evidence of mitral stenosis.  4. The aortic valve is normal in structure. Aortic valve regurgitation is not visualized. No aortic stenosis is present.  5. The inferior vena cava is normal in size with greater than 50%  respiratory variability, suggesting right atrial pressure of 3 mmHg. FINDINGS  Left Ventricle: Hyperdynamic LV, not well interrogated but appears to be an intercavitary gradient of around . Left ventricular ejection fraction, by estimation, is 70 to 75%. The left ventricle has hyperdynamic function. The left ventricle has no  regional wall motion abnormalities. The left ventricular internal cavity size was normal in size. There is no left ventricular hypertrophy. Left ventricular diastolic parameters are consistent with Grade I diastolic dysfunction (impaired relaxation). Right Ventricle: The right ventricular size is normal. No increase in right ventricular wall thickness. Right ventricular systolic function is normal. Left Atrium: Left atrial size was normal in size. Right Atrium: Right atrial size was normal in size. Pericardium: There is no evidence of pericardial effusion. Mitral Valve: The mitral valve is normal in structure. No evidence of mitral valve regurgitation. No evidence of mitral valve stenosis. Tricuspid Valve: The tricuspid valve is normal in structure. Tricuspid valve regurgitation is not demonstrated. No evidence of tricuspid stenosis. Aortic Valve: The aortic valve is normal  in structure. Aortic valve regurgitation is not visualized. No aortic stenosis is present. Pulmonic Valve: The pulmonic valve was normal in structure. Pulmonic valve regurgitation is not visualized. No evidence of pulmonic stenosis. Aorta: The aortic root is normal in size and structure. Venous: The inferior vena cava is normal in size with greater than 50% respiratory variability, suggesting right atrial pressure of 3 mmHg. IAS/Shunts: No atrial level shunt detected by color flow Doppler.  LEFT VENTRICLE PLAX 2D LVIDd:         4.00 cm   Diastology LVIDs:         2.50 cm   LV e' medial:    4.46 cm/s LV PW:         1.10 cm   LV E/e' medial:  11.9 LV IVS:        0.90 cm   LV e' lateral:   4.90 cm/s LVOT diam:     1.90 cm    LV E/e' lateral: 10.8 LV SV:         82 LV SV Index:   49 LVOT Area:     2.84 cm  RIGHT VENTRICLE             IVC RV S prime:     13.70 cm/s  IVC diam: 1.40 cm TAPSE (M-mode): 1.7 cm LEFT ATRIUM             Index        RIGHT ATRIUM          Index LA diam:        2.90 cm 1.73 cm/m   RA Area:     8.55 cm LA Vol (A2C):   26.0 ml 15.55 ml/m  RA Volume:   16.10 ml 9.63 ml/m LA Vol (A4C):   29.4 ml 17.59 ml/m LA Biplane Vol: 28.9 ml 17.29 ml/m  AORTIC VALVE LVOT Vmax:   135.00 cm/s LVOT Vmean:  90.700 cm/s LVOT VTI:    0.289 m  AORTA Ao Root diam: 2.80 cm MITRAL VALVE MV Area (PHT): 3.77 cm    SHUNTS MV Decel Time: 201 msec    Systemic VTI:  0.29 m MV E velocity: 52.90 cm/s  Systemic Diam: 1.90 cm MV A velocity: 96.90 cm/s MV E/A ratio:  0.55 Morene Brownie Electronically signed by Morene Brownie Signature Date/Time: 04/15/2024/6:09:34 PM    Final    MR ABDOMEN MRCP W WO CONTAST Result Date: 04/15/2024 CLINICAL DATA:  Gallbladder wall thickening/edema and cholelithiasis. Elevated liver function tests. EXAM: MRI ABDOMEN WITHOUT AND WITH CONTRAST (INCLUDING MRCP) TECHNIQUE: Multiplanar multisequence MR imaging of the abdomen was performed both before and after the administration of intravenous contrast. Heavily T2-weighted images of the biliary and pancreatic ducts were obtained, and three-dimensional MRCP images were rendered by post processing. CONTRAST:  6mL GADAVIST  GADOBUTROL  1 MMOL/ML IV SOLN COMPARISON:  Multiple exams, including CT and ultrasound exams from 04/14/2024 FINDINGS: Despite efforts by the technologist and patient, motion artifact is present on today's exam and could not be eliminated. This reduces exam sensitivity and specificity. Lower chest: Hazy signal posteriorly in the lower lobes, right greater than left, corresponding with the ground-glass opacities on CT scan. Hepatobiliary: Abnormal gallbladder wall thickening with layering small gallstones in the gallbladder. No biliary dilatation  or definite filling defect in the common bile duct or common hepatic duct. No significant abnormal focal hepatic lesion is identified. Pancreas:  Unremarkable Spleen:  Unremarkable Adrenals/Urinary Tract: 1.2 cm benign Bosniak category 2 cyst of the right  kidney upper pole has a single thin internal septation. Benign Bosniak category 1 cyst of the left kidney upper pole measuring 0.8 cm in diameter on image 20 series 12. Benign 0.7 cm left kidney lower pole Bosniak category 1 cyst. No further imaging workup of these lesions is indicated. Adrenal glands unremarkable. Stomach/Bowel: Prominent stool throughout the colon favors constipation. Vascular/Lymphatic: Atherosclerosis is present, including aortoiliac atherosclerotic disease. 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 Other: Trace nonspecific edema deep to both iliacus muscles in the pelvis. Musculoskeletal: Mild lumbar spondylosis and degenerative disc disease. IMPRESSION: 1. Abnormal gallbladder wall thickening with layering small gallstones in the gallbladder. Acute cholecystitis not excluded. No biliary dilatation or definite filling defect in the common bile duct or common hepatic duct. 2. Prominent stool throughout the colon favors constipation. 3. 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 4. Hazy signal posteriorly in the lower lobes, right greater than left, corresponding with the ground-glass opacities on CT scan. 5. Mild lumbar spondylosis and degenerative disc disease. 6. Trace nonspecific edema deep to both iliacus muscles in the pelvis. Electronically Signed   By: Ryan Salvage M.D.   On: 04/15/2024 16:12   MR 3D Recon At Scanner Result Date: 04/15/2024 CLINICAL DATA:  Gallbladder wall thickening/edema and cholelithiasis. Elevated liver function tests. EXAM: MRI ABDOMEN WITHOUT AND WITH  CONTRAST (INCLUDING MRCP) TECHNIQUE: Multiplanar multisequence MR imaging of the abdomen was performed both before and after the administration of intravenous contrast. Heavily T2-weighted images of the biliary and pancreatic ducts were obtained, and three-dimensional MRCP images were rendered by post processing. CONTRAST:  6mL GADAVIST  GADOBUTROL  1 MMOL/ML IV SOLN COMPARISON:  Multiple exams, including CT and ultrasound exams from 04/14/2024 FINDINGS: Despite efforts by the technologist and patient, motion artifact is present on today's exam and could not be eliminated. This reduces exam sensitivity and specificity. Lower chest: Hazy signal posteriorly in the lower lobes, right greater than left, corresponding with the ground-glass opacities on CT scan. Hepatobiliary: Abnormal gallbladder wall thickening with layering small gallstones in the gallbladder. No biliary dilatation or definite filling defect in the common bile duct or common hepatic duct. No significant abnormal focal hepatic lesion is identified. Pancreas:  Unremarkable Spleen:  Unremarkable Adrenals/Urinary Tract: 1.2 cm benign Bosniak category 2 cyst of the right kidney upper pole has a single thin internal septation. Benign Bosniak category 1 cyst of the left kidney upper pole measuring 0.8 cm in diameter on image 20 series 12. Benign 0.7 cm left kidney lower pole Bosniak category 1 cyst. No further imaging workup of these lesions is indicated. Adrenal glands unremarkable. Stomach/Bowel: Prominent stool throughout the colon favors constipation. Vascular/Lymphatic: Atherosclerosis is present, including aortoiliac atherosclerotic disease. 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 Other: Trace nonspecific edema deep to both iliacus muscles in the pelvis. Musculoskeletal: Mild lumbar spondylosis and degenerative disc disease. IMPRESSION: 1. Abnormal gallbladder wall thickening  with layering small gallstones in the gallbladder. Acute cholecystitis not excluded. No biliary dilatation or definite filling defect in the common bile duct or common hepatic duct. 2. Prominent stool throughout the colon favors constipation. 3. 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 4. Hazy signal posteriorly in the lower lobes, right greater than left, corresponding with the ground-glass opacities on CT  scan. 5. Mild lumbar spondylosis and degenerative disc disease. 6. Trace nonspecific edema deep to both iliacus muscles in the pelvis. Electronically Signed   By: Ryan Salvage M.D.   On: 04/15/2024 16:12   Nilda Fendt, MD, PhD [facility]  Between 7 am - 7 pm I am available, please contact me via Amion (for emergencies) or Securechat (non urgent messages)  Between 7 pm - 7 am I am not available, please contact night coverage MD/APP via Amion

## 2024-04-17 DIAGNOSIS — K819 Cholecystitis, unspecified: Secondary | ICD-10-CM | POA: Diagnosis not present

## 2024-04-17 LAB — COMPREHENSIVE METABOLIC PANEL WITH GFR
ALT: 169 U/L — ABNORMAL HIGH (ref 0–44)
AST: 81 U/L — ABNORMAL HIGH (ref 15–41)
Albumin: 3 g/dL — ABNORMAL LOW (ref 3.5–5.0)
Alkaline Phosphatase: 130 U/L — ABNORMAL HIGH (ref 38–126)
Anion gap: 8 (ref 5–15)
BUN: 10 mg/dL (ref 8–23)
CO2: 25 mmol/L (ref 22–32)
Calcium: 9.2 mg/dL (ref 8.9–10.3)
Chloride: 107 mmol/L (ref 98–111)
Creatinine, Ser: 0.83 mg/dL (ref 0.61–1.24)
GFR, Estimated: >60 mL/min (ref >60)
Glucose, Bld: 98 mg/dL (ref 70–99)
Potassium: 3.4 mmol/L — ABNORMAL LOW (ref 3.5–5.1)
Sodium: 140 mmol/L (ref 135–145)
Total Bilirubin: 2 mg/dL — ABNORMAL HIGH (ref 0.0–1.2)
Total Protein: 6.7 g/dL (ref 6.5–8.1)

## 2024-04-17 LAB — GLUCOSE, CAPILLARY
Glucose-Capillary: 99 mg/dL (ref 70–99)
Glucose-Capillary: 90 mg/dL (ref 70–99)
Glucose-Capillary: 120 mg/dL — ABNORMAL HIGH (ref 70–99)
Glucose-Capillary: 170 mg/dL — ABNORMAL HIGH (ref 70–99)
Glucose-Capillary: 106 mg/dL — ABNORMAL HIGH (ref 70–99)

## 2024-04-17 LAB — CBC
HCT: 38.6 % — ABNORMAL LOW (ref 39.0–52.0)
Hemoglobin: 13 g/dL (ref 13.0–17.0)
MCH: 31.3 pg (ref 26.0–34.0)
MCHC: 33.7 g/dL (ref 30.0–36.0)
MCV: 93 fL (ref 80.0–100.0)
Platelets: 129 K/uL — ABNORMAL LOW (ref 150–400)
RBC: 4.15 MIL/uL — ABNORMAL LOW (ref 4.22–5.81)
RDW: 11.5 % (ref 11.5–15.5)
WBC: 4.9 K/uL (ref 4.0–10.5)
nRBC: 0 % (ref 0.0–0.2)

## 2024-04-17 LAB — MAGNESIUM: Magnesium: 1.7 mg/dL (ref 1.7–2.4)

## 2024-04-17 MED ORDER — HYDRALAZINE HCL 20 MG/ML IJ SOLN
5.0000 mg | Freq: Once | INTRAMUSCULAR | Status: AC
  Administered 2024-04-17: 5 mg via INTRAVENOUS
  Filled 2024-04-17: qty 1

## 2024-04-17 MED ORDER — POTASSIUM CHLORIDE CRYS ER 20 MEQ PO TBCR
40.0000 meq | EXTENDED_RELEASE_TABLET | Freq: Once | ORAL | Status: AC
  Administered 2024-04-17: 40 meq via ORAL
  Filled 2024-04-17: qty 2

## 2024-04-17 MED ORDER — LEVOTHYROXINE SODIUM 88 MCG PO TABS
88.0000 ug | ORAL_TABLET | Freq: Every day | ORAL | Status: DC
  Administered 2024-04-18 – 2024-04-23 (×6): 88 ug via ORAL
  Filled 2024-04-17 (×6): qty 1

## 2024-04-17 MED ORDER — HYDRALAZINE HCL 10 MG PO TABS
10.0000 mg | ORAL_TABLET | Freq: Four times a day (QID) | ORAL | Status: DC
  Administered 2024-04-17 – 2024-04-21 (×15): 10 mg via ORAL
  Filled 2024-04-17 (×18): qty 1

## 2024-04-17 NOTE — Progress Notes (Signed)
 Progress Note     Subjective: Pt is awake. Oriented to person, [place], 2025. Denies abdominal pain. State she ate a little yesterday without pain. Denies breakfast yet today.   Objective: Vital signs in last 24 hours: Temp:  [97.2 F (36.2 C)-98.9 F (37.2 C)] 98.5 F (36.9 C) (07/19 0750) Pulse Rate:  [73-92] 76 (07/19 0750) Resp:  [16-19] 16 (07/19 0750) BP: (146-195)/(78-98) 183/93 (07/19 0915) SpO2:  [99 %-100 %] 99 % (07/19 0750) Last BM Date : 04/16/24  Intake/Output from previous day: 07/18 0701 - 07/19 0700 In: 1675 [P.O.:120; I.V.:1305; IV Piggyback:250] Out: 100 [Urine:100] Intake/Output this shift: No intake/output data recorded.  PE: General: WD, elderly male who is laying in bed in NAD Heart: regular, rate, and rhythm.   Lungs: No wheezes, rhonchi, or rales noted.  Respiratory effort nonlabored Abd: soft, NT, ND, no masses, hernias, or organomegaly   Lab Results:  Recent Labs    04/16/24 0559 04/17/24 0415  WBC 5.2 4.9  HGB 13.7 13.0  HCT 41.5 38.6*  PLT 133* 129*   BMET Recent Labs    04/16/24 0559 04/17/24 0415  NA 143 140  K 3.6 3.4*  CL 107 107  CO2 25 25  GLUCOSE 100* 98  BUN 18 10  CREATININE 0.93 0.83  CALCIUM  9.6 9.2   PT/INR Recent Labs    04/14/24 1343  LABPROT 16.4*  INR 1.3*   CMP     Component Value Date/Time   NA 140 04/17/2024 0415   NA 147 (H) 08/09/2019 0830   NA 143 04/07/2014 1221   K 3.4 (L) 04/17/2024 0415   K 4.3 04/07/2014 1221   CL 107 04/17/2024 0415   CO2 25 04/17/2024 0415   CO2 24 04/07/2014 1221   GLUCOSE 98 04/17/2024 0415   GLUCOSE 105 04/07/2014 1221   BUN 10 04/17/2024 0415   BUN 17 08/09/2019 0830   BUN 19.1 04/07/2014 1221   CREATININE 0.83 04/17/2024 0415   CREATININE 1.0 04/07/2014 1221   CALCIUM  9.2 04/17/2024 0415   CALCIUM  9.0 04/07/2014 1221   PROT 6.7 04/17/2024 0415   PROT 7.0 08/09/2019 0830   PROT 7.2 04/07/2014 1221   ALBUMIN 3.0 (L) 04/17/2024 0415   ALBUMIN 4.3  08/09/2019 0830   ALBUMIN 3.8 04/07/2014 1221   AST 81 (H) 04/17/2024 0415   AST 19 04/07/2014 1221   ALT 169 (H) 04/17/2024 0415   ALT 21 04/07/2014 1221   ALKPHOS 130 (H) 04/17/2024 0415   ALKPHOS 60 04/07/2014 1221   BILITOT 2.0 (H) 04/17/2024 0415   BILITOT 0.4 08/09/2019 0830   BILITOT 0.92 04/07/2014 1221   GFRNONAA >60 04/17/2024 0415   GFRAA 71 08/09/2019 0830   Lipase     Component Value Date/Time   LIPASE 28 04/14/2024 1939       Studies/Results: ECHOCARDIOGRAM COMPLETE Result Date: 04/15/2024    ECHOCARDIOGRAM REPORT   Patient Name:   JERILYNN DELENA LOWES Date of Exam: 04/15/2024 Medical Rec #:  [id number]              Height:       63.0 in Accession #:    [id number]             Weight:       142.0 lb Date of Birth:  08/01/1946              BSA:          1.672 m Patient Age:  78 years               BP:           152/79 mmHg Patient Gender: M                      HR:           75 bpm. Exam Location:  Inpatient Procedure: 2D Echo, Cardiac Doppler and Color Doppler (Both Spectral and Color            Flow Doppler were utilized during procedure). Indications:    CHF  History:        Patient has prior history of Echocardiogram examinations, most                 recent 10/03/2021. Risk Factors:Hypertension and Dyslipidemia.  Sonographer:    Philomena Daring Referring Phys: [id number] ANASTASSIA DOUTOVA  Sonographer Comments: Technically difficult study due to poor echo windows. Image acquisition challenging due to patient body habitus. IMPRESSIONS  1. Hyperdynamic LV, not well interrogated but appears to be an intercavitary gradient of around . Left ventricular ejection fraction, by estimation, is 70 to 75%. The left ventricle has hyperdynamic function. The left ventricle has no regional wall motion abnormalities. Left ventricular diastolic parameters are consistent with Grade I diastolic dysfunction (impaired relaxation).  2. Right ventricular systolic function is normal. The right  ventricular size is normal.  3. The mitral valve is normal in structure. No evidence of mitral valve regurgitation. No evidence of mitral stenosis.  4. The aortic valve is normal in structure. Aortic valve regurgitation is not visualized. No aortic stenosis is present.  5. The inferior vena cava is normal in size with greater than 50% respiratory variability, suggesting right atrial pressure of 3 mmHg. FINDINGS  Left Ventricle: Hyperdynamic LV, not well interrogated but appears to be an intercavitary gradient of around . Left ventricular ejection fraction, by estimation, is 70 to 75%. The left ventricle has hyperdynamic function. The left ventricle has no  regional wall motion abnormalities. The left ventricular internal cavity size was normal in size. There is no left ventricular hypertrophy. Left ventricular diastolic parameters are consistent with Grade I diastolic dysfunction (impaired relaxation). Right Ventricle: The right ventricular size is normal. No increase in right ventricular wall thickness. Right ventricular systolic function is normal. Left Atrium: Left atrial size was normal in size. Right Atrium: Right atrial size was normal in size. Pericardium: There is no evidence of pericardial effusion. Mitral Valve: The mitral valve is normal in structure. No evidence of mitral valve regurgitation. No evidence of mitral valve stenosis. Tricuspid Valve: The tricuspid valve is normal in structure. Tricuspid valve regurgitation is not demonstrated. No evidence of tricuspid stenosis. Aortic Valve: The aortic valve is normal in structure. Aortic valve regurgitation is not visualized. No aortic stenosis is present. Pulmonic Valve: The pulmonic valve was normal in structure. Pulmonic valve regurgitation is not visualized. No evidence of pulmonic stenosis. Aorta: The aortic root is normal in size and structure. Venous: The inferior vena cava is normal in size with greater than 50% respiratory variability,  suggesting right atrial pressure of 3 mmHg. IAS/Shunts: No atrial level shunt detected by color flow Doppler.  LEFT VENTRICLE PLAX 2D LVIDd:         4.00 cm   Diastology LVIDs:         2.50 cm   LV e' medial:    4.46 cm/s LV PW:  1.10 cm   LV E/e' medial:  11.9 LV IVS:        0.90 cm   LV e' lateral:   4.90 cm/s LVOT diam:     1.90 cm   LV E/e' lateral: 10.8 LV SV:         82 LV SV Index:   49 LVOT Area:     2.84 cm  RIGHT VENTRICLE             IVC RV S prime:     13.70 cm/s  IVC diam: 1.40 cm TAPSE (M-mode): 1.7 cm LEFT ATRIUM             Index        RIGHT ATRIUM          Index LA diam:        2.90 cm 1.73 cm/m   RA Area:     8.55 cm LA Vol (A2C):   26.0 ml 15.55 ml/m  RA Volume:   16.10 ml 9.63 ml/m LA Vol (A4C):   29.4 ml 17.59 ml/m LA Biplane Vol: 28.9 ml 17.29 ml/m  AORTIC VALVE LVOT Vmax:   135.00 cm/s LVOT Vmean:  90.700 cm/s LVOT VTI:    0.289 m  AORTA Ao Root diam: 2.80 cm MITRAL VALVE MV Area (PHT): 3.77 cm    SHUNTS MV Decel Time: 201 msec    Systemic VTI:  0.29 m MV E velocity: 52.90 cm/s  Systemic Diam: 1.90 cm MV A velocity: 96.90 cm/s MV E/A ratio:  0.55 Morene Brownie Electronically signed by Morene Brownie Signature Date/Time: 04/15/2024/6:09:34 PM    Final    MR ABDOMEN MRCP W WO CONTAST Result Date: 04/15/2024 CLINICAL DATA:  Gallbladder wall thickening/edema and cholelithiasis. Elevated liver function tests. EXAM: MRI ABDOMEN WITHOUT AND WITH CONTRAST (INCLUDING MRCP) TECHNIQUE: Multiplanar multisequence MR imaging of the abdomen was performed both before and after the administration of intravenous contrast. Heavily T2-weighted images of the biliary and pancreatic ducts were obtained, and three-dimensional MRCP images were rendered by post processing. CONTRAST:  6mL GADAVIST  GADOBUTROL  1 MMOL/ML IV SOLN COMPARISON:  Multiple exams, including CT and ultrasound exams from 04/14/2024 FINDINGS: Despite efforts by the technologist and patient, motion artifact is present on today's  exam and could not be eliminated. This reduces exam sensitivity and specificity. Lower chest: Hazy signal posteriorly in the lower lobes, right greater than left, corresponding with the ground-glass opacities on CT scan. Hepatobiliary: Abnormal gallbladder wall thickening with layering small gallstones in the gallbladder. No biliary dilatation or definite filling defect in the common bile duct or common hepatic duct. No significant abnormal focal hepatic lesion is identified. Pancreas:  Unremarkable Spleen:  Unremarkable Adrenals/Urinary Tract: 1.2 cm benign Bosniak category 2 cyst of the right kidney upper pole has a single thin internal septation. Benign Bosniak category 1 cyst of the left kidney upper pole measuring 0.8 cm in diameter on image 20 series 12. Benign 0.7 cm left kidney lower pole Bosniak category 1 cyst. No further imaging workup of these lesions is indicated. Adrenal glands unremarkable. Stomach/Bowel: Prominent stool throughout the colon favors constipation. Vascular/Lymphatic: Atherosclerosis is present, including aortoiliac atherosclerotic disease. 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 Other: Trace nonspecific edema deep to both iliacus muscles in the pelvis. Musculoskeletal: Mild lumbar spondylosis and degenerative disc disease. IMPRESSION: 1. Abnormal gallbladder wall thickening with layering small gallstones in the gallbladder. Acute cholecystitis not excluded. No biliary  dilatation or definite filling defect in the common bile duct or common hepatic duct. 2. Prominent stool throughout the colon favors constipation. 3. 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 4. Hazy signal posteriorly in the lower lobes, right greater than left, corresponding with the ground-glass opacities on CT scan. 5. Mild lumbar spondylosis and degenerative disc  disease. 6. Trace nonspecific edema deep to both iliacus muscles in the pelvis. Electronically Signed   By: Ryan Salvage M.D.   On: 04/15/2024 16:12   MR 3D Recon At Scanner Result Date: 04/15/2024 CLINICAL DATA:  Gallbladder wall thickening/edema and cholelithiasis. Elevated liver function tests. EXAM: MRI ABDOMEN WITHOUT AND WITH CONTRAST (INCLUDING MRCP) TECHNIQUE: Multiplanar multisequence MR imaging of the abdomen was performed both before and after the administration of intravenous contrast. Heavily T2-weighted images of the biliary and pancreatic ducts were obtained, and three-dimensional MRCP images were rendered by post processing. CONTRAST:  6mL GADAVIST  GADOBUTROL  1 MMOL/ML IV SOLN COMPARISON:  Multiple exams, including CT and ultrasound exams from 04/14/2024 FINDINGS: Despite efforts by the technologist and patient, motion artifact is present on today's exam and could not be eliminated. This reduces exam sensitivity and specificity. Lower chest: Hazy signal posteriorly in the lower lobes, right greater than left, corresponding with the ground-glass opacities on CT scan. Hepatobiliary: Abnormal gallbladder wall thickening with layering small gallstones in the gallbladder. No biliary dilatation or definite filling defect in the common bile duct or common hepatic duct. No significant abnormal focal hepatic lesion is identified. Pancreas:  Unremarkable Spleen:  Unremarkable Adrenals/Urinary Tract: 1.2 cm benign Bosniak category 2 cyst of the right kidney upper pole has a single thin internal septation. Benign Bosniak category 1 cyst of the left kidney upper pole measuring 0.8 cm in diameter on image 20 series 12. Benign 0.7 cm left kidney lower pole Bosniak category 1 cyst. No further imaging workup of these lesions is indicated. Adrenal glands unremarkable. Stomach/Bowel: Prominent stool throughout the colon favors constipation. Vascular/Lymphatic: Atherosclerosis is present, including aortoiliac  atherosclerotic disease. 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 Other: Trace nonspecific edema deep to both iliacus muscles in the pelvis. Musculoskeletal: Mild lumbar spondylosis and degenerative disc disease. IMPRESSION: 1. Abnormal gallbladder wall thickening with layering small gallstones in the gallbladder. Acute cholecystitis not excluded. No biliary dilatation or definite filling defect in the common bile duct or common hepatic duct. 2. Prominent stool throughout the colon favors constipation. 3. 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 4. Hazy signal posteriorly in the lower lobes, right greater than left, corresponding with the ground-glass opacities on CT scan. 5. Mild lumbar spondylosis and degenerative disc disease. 6. Trace nonspecific edema deep to both iliacus muscles in the pelvis. Electronically Signed   By: Ryan Salvage M.D.   On: 04/15/2024 16:12    Anti-infectives: Anti-infectives (From admission, onward)    Start     Dose/Rate Route Frequency Ordered Stop   04/15/24 1800  azithromycin  (ZITHROMAX ) 500 mg in sodium chloride  0.9 % 250 mL IVPB        500 mg 250 mL/hr over 60 Minutes Intravenous Every 24 hours 04/14/24 2102     04/15/24 0200  piperacillin -tazobactam (ZOSYN ) IVPB 3.375 g        3.375 g 12.5 mL/hr over 240 Minutes Intravenous Every 8 hours 04/14/24 2105  04/14/24 1745  piperacillin -tazobactam (ZOSYN ) IVPB 3.375 g        3.375 g 100 mL/hr over 30 Minutes Intravenous  Once 04/14/24 1744 04/14/24 1913         Latest Ref Rng & Units 04/17/2024    4:15 AM 04/16/2024    5:59 AM 04/15/2024    2:57 AM  Hepatic Function  Total Protein 6.5 - 8.1 g/dL 6.7  7.1  6.1   Albumin 3.5 - 5.0 g/dL 3.0  3.3  3.1   AST 15 - 41 U/L 81  108  224   ALT 0 - 44 U/L 169  232  319   Alk Phosphatase 38 - 126 U/L 130  145  154    Total Bilirubin 0.0 - 1.2 mg/dL 2.0  2.3  3.5      Assessment/Plan  Fever Elevated LFTs with hyperbilirubinemia Abnormal gallbladder on imaging - CT 7/16 with dilated gallbladder with some wall thickening and edema and cholelithiasis - RUQ US  with some gallbladder wall thickening and edema - Patient is non-tender on exam, afebrile last 48 hours and no leukocytosis - MRCP 7/17 w. Gallbladder wall thickening and sludge, no biliary dilation. Evidence of constipation. - LFTs are all improving as above, LFTs were normal 2 weeks ago. This is suspicious for passing small gallstones/choledocholithiasis.  - clinically patient is non-tender in the RUQ, no leukocytosis, fever resolved. Tolerating PO without recurrent sxs. LFTs continue to improve. Recommend non-op mgmt with 10 days abx, ok to switch to PO antibiotics. Surgery will sign off. Call as needed.  FEN: NPO, IVF per [facility] VTE: ok to have SQH or LMWH from surgery standpoint ID: [email or web address] /[email or web address]   - per [facility] -  CAD s/p remote stent placement HTN Hypothyroidism GERD BPH Congenital blindness   LOS: 3 days   I reviewed ED provider notes, hospitalist notes, last 24 h vitals and pain scores, last 48 h intake and output, last 24 h labs and trends, and last 24 h imaging results.  This care required moderate level of medical decision making.    Almarie GORMAN Pringle, [facility] Surgery 04/17/2024, 9:21 AM Please see Amion for pager number during day hours 7:00am-4:30pm

## 2024-04-17 NOTE — Progress Notes (Signed)
 PROGRESS NOTE  Dustin Hess FMW:[id number] DOB: 25-Aug-1946 DOA: 04/14/2024 PCP: [name], MD   LOS: 3 days   Brief Narrative / Interim history: 78 year old male with history of CAD with PCI in 2005, HTN, hypothyroidism, blindness who comes in from [facility] with increased fatigue, low-grade fever.  Patient had abdominal pain the day prior without nausea or vomiting.  There is no reported cough or chest congestion.  CT scan of the abdomen and pelvis on admission showed possible pulmonary infiltrates as well as dilated gallbladder with some wall edema.  Surgery was consulted, he was placed on antibiotics and admitted to the hospital  Subjective / 24h Interval events: No abdominal pain, no nausea or vomiting  Assesement and Plan: Principal Problem:   Cholecystitis Active Problems:   Hypothyroidism   Essential hypertension, benign   Benign prostatic hyperplasia   CAD (coronary artery disease) of artery bypass graft   Dyslipidemia   Acute cholecystitis   CAP (community acquired pneumonia)  Principal problem Cholelithiasis, concern for cholecystitis -no significant abdominal pain, gallbladder ultrasound showed stones and sludge.  His LFTs are elevated, and bilirubin is up too, I wonder whether he has been passing some stones.  Surgery following, underwent an MRI/MRCP which showed again raise concern for cholecystitis and there were no signs of choledocholithiasis - Discussed with general surgery team, they recommend patient be treated conservatively with antibiotics - Allow diet - He seems to be tolerating diet well, LFTs improving, afebrile  Active problems Possible pneumonia -he does not have any cough or respiratory symptoms.  Continue antibiotics, will treat empirically  CAD-no chest pain, continue metoprolol , Imdur   Hypothyroidism-continue Synthroid   BPH-continue tamsulosin   Disposition-awaiting PT eval before returning to [facility], discussed with TOC,  probably needs insurance Auth again  Scheduled Meds:  hydrALAZINE   10 mg Oral Q6H   isosorbide  mononitrate  30 mg Oral Daily   [START ON 04/18/2024] levothyroxine   88 mcg Oral Q0600   metoprolol  succinate  25 mg Oral Daily   polyethylene glycol  17 g Oral BID   tamsulosin   0.8 mg Oral Daily   Continuous Infusions:  azithromycin  Stopped (04/16/24 1958)   piperacillin -tazobactam (ZOSYN )  IV 3.375 g (04/17/24 0516)   PRN Meds:.acetaminophen  **OR** acetaminophen , bisacodyl , fentaNYL  (SUBLIMAZE ) injection, HYDROcodone -acetaminophen , ondansetron  **OR** ondansetron  (ZOFRAN ) IV  Current Outpatient Medications  Medication Instructions   acetaminophen  (TYLENOL ) 650 mg, Oral, Every 8 hours PRN   aspirin  81 mg, Daily   atorvastatin  (LIPITOR) 20 mg, Daily   Cholecalciferol (VITAMIN D3) 1000 UNITS CAPS 1 capsule, Daily   dutasteride  (AVODART ) 0.5 mg, Daily   ferrous sulfate  325 mg, Oral, Daily with breakfast   isosorbide  mononitrate (IMDUR ) 30 mg, Oral, Daily   levothyroxine  (SYNTHROID ) 88 mcg, Daily before breakfast   metoprolol  succinate (TOPROL -XL) 25 mg, Oral, Daily, TAKE WITH OR IMMEDIATELY FOLLOWING A MEAL.   Multiple Vitamin (MULTIVITAMIN) tablet 1 tablet, Daily   nitroGLYCERIN  (NITROSTAT ) 0.4 mg, Sublingual, Every 5 min PRN, Must call and schedule appt for future refills   senna-docusate (SENOKOT-S) 8.6-50 MG tablet 1 tablet, Oral, Daily   tamsulosin  (FLOMAX ) 0.8 mg, Daily    Diet Orders (From admission, onward)     Start     Ordered   04/16/24 0906  Diet Heart Room service appropriate? Yes; Fluid consistency: Thin  Diet effective now       Question Answer Comment  Room service appropriate? Yes   Fluid consistency: Thin      04/16/24 0905  DVT prophylaxis: SCDs Start: 04/14/24 2129   Lab Results  Component Value Date   PLT 129 (L) 04/17/2024      Code Status: Full Code  Family Communication: no family at bedside, updated spouse over the phone  Status  is: Inpatient Remains inpatient appropriate because: severity of illness  Level of care: Telemetry Medical  Consultants:  General surgery   Objective: Vitals:   04/16/24 2219 04/17/24 0414 04/17/24 0750 04/17/24 0915  BP: (!) 175/85 (!) 195/98 (!) 187/87 (!) 183/93  Pulse: 92 73 76   Resp: 18 19 16    Temp: 98.4 F (36.9 C) 98.9 F (37.2 C) 98.5 F (36.9 C)   TempSrc: Axillary Axillary Oral   SpO2: 100% 100% 99%   Weight:      Height:        Intake/Output Summary (Last 24 hours) at 04/17/2024 1024 Last data filed at 04/17/2024 0407 Gross per 24 hour  Intake 1674.96 ml  Output --  Net 1674.96 ml   Wt Readings from Last 3 Encounters:  04/14/24 64.4 kg  03/28/24 64.4 kg  02/24/24 64.4 kg    Examination:  Constitutional: NAD Eyes: lids and conjunctivae normal, no scleral icterus ENMT: mmm Neck: normal, supple Respiratory: clear to auscultation bilaterally, no wheezing, no crackles. Normal respiratory effort.  Cardiovascular: Regular rate and rhythm, no murmurs / rubs / gallops. No LE edema. Abdomen: soft, no distention, no tenderness. Bowel sounds positive.  Skin: no rashes Neurologic: no focal deficits, equal strength    Data Reviewed: I have independently reviewed following labs and imaging studies   CBC Recent Labs  Lab 04/14/24 1343 04/15/24 0257 04/16/24 0559 04/17/24 0415  WBC 8.6 8.1 5.2 4.9  HGB 13.7 12.5* 13.7 13.0  HCT 41.1 37.8* 41.5 38.6*  PLT 159 135* 133* 129*  MCV 95.4 94.7 94.5 93.0  MCH 31.8 31.3 31.2 31.3  MCHC 33.3 33.1 33.0 33.7  RDW 11.6 11.8 11.7 11.5  LYMPHSABS 0.2*  --   --   --   MONOABS 0.6  --   --   --   EOSABS 0.0  --   --   --   BASOSABS 0.0  --   --   --     Recent Labs  Lab 04/14/24 1343 04/14/24 1440 04/14/24 1537 04/15/24 0257 04/16/24 0559 04/17/24 0415  NA 143  --   --  141 143 140  K 3.8  --   --  3.8 3.6 3.4*  CL 109  --   --  109 107 107  CO2 23  --   --  24 25 25   GLUCOSE 109*  --   --  90 100* 98   BUN 20  --   --  18 18 10   CREATININE 0.87  --   --  0.91 0.93 0.83  CALCIUM  9.5  --   --  9.3 9.6 9.2  AST 488*  --   --  224* 108* 81*  ALT 368*  --   --  319* 232* 169*  ALKPHOS 155*  --   --  154* 145* 130*  BILITOT 3.3*  --   --  3.5* 2.3* 2.0*  ALBUMIN 3.6  --   --  3.1* 3.3* 3.0*  MG  --   --   --  2.0 1.8 1.7  PROCALCITON  --   --   --  0.13  --   --   LATICACIDVEN  --  0.9 0.7  --   --   --  INR 1.3*  --   --   --   --   --   TSH  --   --   --  2.168  --   --   HGBA1C  --   --   --  4.6*  --   --     ------------------------------------------------------------------------------------------------------------------ No results for input(s): CHOL, HDL, LDLCALC, TRIG, CHOLHDL, LDLDIRECT in the last 72 hours.  Lab Results  Component Value Date   HGBA1C 4.6 (L) 04/15/2024   ------------------------------------------------------------------------------------------------------------------ Recent Labs    04/15/24 0257  TSH 2.168    Cardiac Enzymes No results for input(s): CKMB, TROPONINI, MYOGLOBIN in the last 168 hours.  Invalid input(s): CK ------------------------------------------------------------------------------------------------------------------    Component Value Date/Time   BNP 297.9 (H) 08/28/2023 1744    CBG: Recent Labs  Lab 04/16/24 1215 04/16/24 1706 04/16/24 2219 04/17/24 0414 04/17/24 0817  GLUCAP 142* 129* 98 99 90    Recent Results (from the past 240 hours)  Culture, blood (Routine x 2)     Status: None (Preliminary result)   Collection Time: 04/14/24  2:50 PM   Specimen: BLOOD RIGHT HAND  Result Value Ref Range Status   Specimen Description BLOOD RIGHT HAND  Final   Special Requests   Final    AEROBIC BOTTLE ONLY Blood Culture results may not be optimal due to an inadequate volume of blood received in culture bottles   Culture   Final    NO GROWTH 3 DAYS Performed at [facility], [address],  [place], [place] [address]    Report Status PENDING  Incomplete  Resp panel by RT-PCR (RSV, Flu A&B, Covid) Anterior Nasal Swab     Status: None   Collection Time: 04/14/24  3:11 PM   Specimen: Anterior Nasal Swab  Result Value Ref Range Status   SARS Coronavirus 2 by RT PCR NEGATIVE NEGATIVE Final   Influenza A by PCR NEGATIVE NEGATIVE Final   Influenza B by PCR NEGATIVE NEGATIVE Final    Comment: (NOTE) The Xpert Xpress SARS-CoV-2/FLU/RSV plus assay is intended as an aid in the diagnosis of influenza from Nasopharyngeal swab specimens and should not be used as a sole basis for treatment. Nasal washings and aspirates are unacceptable for Xpert Xpress SARS-CoV-2/FLU/RSV testing.  Fact Sheet for Patients: BloggerCourse.com  Fact Sheet for Healthcare Providers: SeriousBroker.it  This test is not yet approved or cleared by the [place]  FDA and has been authorized for detection and/or diagnosis of SARS-CoV-2 by FDA under an Emergency Use Authorization (EUA). This EUA will remain in effect (meaning this test can be used) for the duration of the COVID-19 declaration under Section 564(b)(1) of the Act, 21 U.S.C. section 360bbb-3(b)(1), unless the authorization is terminated or revoked.     Resp Syncytial Virus by PCR NEGATIVE NEGATIVE Final    Comment: (NOTE) Fact Sheet for Patients: BloggerCourse.com  Fact Sheet for Healthcare Providers: SeriousBroker.it  This test is not yet approved or cleared by the [place]  FDA and has been authorized for detection and/or diagnosis of SARS-CoV-2 by FDA under an Emergency Use Authorization (EUA). This EUA will remain in effect (meaning this test can be used) for the duration of the COVID-19 declaration under Section 564(b)(1) of the Act, 21 U.S.C. section 360bbb-3(b)(1), unless the authorization is terminated or revoked.  Performed at  [facility], [address] [place], [place] [address]   Culture, blood (Routine x 2)     Status: None (Preliminary result)  Collection Time: 04/15/24  4:15 AM   Specimen: BLOOD  Result Value Ref Range Status   Specimen Description BLOOD SITE NOT SPECIFIED  Final   Special Requests   Final    BOTTLES DRAWN AEROBIC AND ANAEROBIC Blood Culture adequate volume   Culture   Final    NO GROWTH 2 DAYS Performed at [facility], [address], [place], [place] [address]    Report Status PENDING  Incomplete     Radiology Studies: No results found.  Nilda Fendt, MD, PhD [facility]  Between 7 am - 7 pm I am available, please contact me via Amion (for emergencies) or Securechat (non urgent messages)  Between 7 pm - 7 am I am not available, please contact night coverage MD/APP via Amion

## 2024-04-17 NOTE — Evaluation (Signed)
 Physical Therapy Evaluation Patient Details Name: Dustin Hess MRN: [id number] DOB: Apr 18, 1946 Today's Date: 04/17/2024  History of Present Illness  78 year old male who comes in from [facility] with increased fatigue, low-grade fever. CT scan of the abdomen and pelvis on admission showed possible pulmonary infiltrates as well as dilated gallbladder with some wall edema. history of CAD with PCI in 2005, HTN, hypothyroidism, blindness.  Clinical Impression  Pt presents with admitting diagnosis above. Mod A to roll pt on bedpan. Pt declined all other mobility citing bowel urgency. PTA pt was at [facility]. Recommend pt return to SNF upon DC. No change in DC/DME recs at this time.         If plan is discharge home, recommend the following: A lot of help with bathing/dressing/bathroom;Supervision due to cognitive status;Assistance with cooking/housework;Assist for transportation;Help with stairs or ramp for entrance;A lot of help with walking and/or transfers   Can travel by private vehicle   No    Equipment Recommendations Other (comment) (TBA)  Recommendations for Other Services       Functional Status Assessment Patient has had a recent decline in their functional status and demonstrates the ability to make significant improvements in function in a reasonable and predictable amount of time.     Precautions / Restrictions Precautions Precautions: Fall Recall of Precautions/Restrictions: Impaired Precaution/Restrictions Comments: Blind Restrictions Weight Bearing Restrictions Per Provider Order: No      Mobility  Bed Mobility Overal bed mobility: Needs Assistance Bed Mobility: Rolling Rolling: Mod assist         General bed mobility comments: Mod A to roll pt on bedpan. Pt declined all other mobility.    Transfers                   General transfer comment: Pt declined due to needing to use bedpan.    Ambulation/Gait                  Stairs             Wheelchair Mobility     Tilt Bed    Modified Rankin (Stroke Patients Only)       Balance                                             Pertinent Vitals/Pain Pain Assessment Pain Assessment: No/denies pain    Home Living Family/patient expects to be discharged to:: Skilled nursing facility                   Additional Comments: Pt presents from SNF after being DC there from previous admission.    Prior Function Prior Level of Function : Needs assist             Mobility Comments: lately needing help to get out of bed (From previous admission) ADLs Comments: family sometimes helps     Extremity/Trunk Assessment   Upper Extremity Assessment Upper Extremity Assessment: Generalized weakness    Lower Extremity Assessment Lower Extremity Assessment: Generalized weakness       Communication   Communication Communication: Impaired Factors Affecting Communication: Reduced clarity of speech    Cognition Arousal: Alert Behavior During Therapy: WFL for tasks assessed/performed   PT - Cognitive impairments: Initiation, Attention, Safety/Judgement   Orientation impairments: Time  Following commands: Impaired Following commands impaired: Only follows one step commands consistently, Follows one step commands with increased time     Cueing Cueing Techniques: Verbal cues, Gestural cues, Tactile cues     General Comments General comments (skin integrity, edema, etc.): VSS    Exercises     Assessment/Plan    PT Assessment Patient needs continued PT services  PT Problem List Decreased strength;Decreased cognition;Decreased activity tolerance;Decreased balance;Decreased mobility;Decreased safety awareness       PT Treatment Interventions DME instruction;Gait training;Functional mobility training;Therapeutic activities;Balance training;Therapeutic exercise    PT Goals (Current goals can be found  in the Care Plan section)       Frequency Min 2X/week     Co-evaluation               AM-PAC PT 6 Clicks Mobility  Outcome Measure Help needed turning from your back to your side while in a flat bed without using bedrails?: A Lot Help needed moving from lying on your back to sitting on the side of a flat bed without using bedrails?: A Lot Help needed moving to and from a bed to a chair (including a wheelchair)?: A Lot Help needed standing up from a chair using your arms (e.g., wheelchair or bedside chair)?: A Lot Help needed to walk in hospital room?: A Lot Help needed climbing 3-5 steps with a railing? : Total 6 Click Score: 11    End of Session   Activity Tolerance: Patient limited by fatigue Patient left: in bed;with call bell/phone within reach;with bed alarm set;Other (comment) (On bedpan) Nurse Communication: Mobility status;Other (comment) (Pt on bedpan) PT Visit Diagnosis: Other abnormalities of gait and mobility (R26.89);Muscle weakness (generalized) (M62.81);Other symptoms and signs involving the nervous system (R29.898)    Time: 8850-8842 PT Time Calculation (min) (ACUTE ONLY): 8 min   Charges:   PT Evaluation $PT Eval Moderate Complexity: 1 Mod   PT General Charges $$ ACUTE PT VISIT: 1 Visit         Dustin Hess, PT, DPT Acute Rehab Services [phone]   Lilla Callejo 04/17/2024, 3:48 PM

## 2024-04-17 NOTE — Plan of Care (Signed)
  Problem: Metabolic: Goal: Ability to maintain appropriate glucose levels will improve Outcome: Progressing   Problem: Skin Integrity: Goal: Risk for impaired skin integrity will decrease Outcome: Progressing   Problem: Clinical Measurements: Goal: Ability to maintain clinical measurements within normal limits will improve Outcome: Progressing Goal: Will remain free from infection Outcome: Progressing Goal: Diagnostic test results will improve Outcome: Progressing

## 2024-04-18 DIAGNOSIS — K819 Cholecystitis, unspecified: Secondary | ICD-10-CM | POA: Diagnosis not present

## 2024-04-18 LAB — COMPREHENSIVE METABOLIC PANEL WITH GFR
ALT: 138 U/L — ABNORMAL HIGH (ref 0–44)
AST: 67 U/L — ABNORMAL HIGH (ref 15–41)
Albumin: 3 g/dL — ABNORMAL LOW (ref 3.5–5.0)
Alkaline Phosphatase: 123 U/L (ref 38–126)
Anion gap: 10 (ref 5–15)
BUN: 10 mg/dL (ref 8–23)
CO2: 24 mmol/L (ref 22–32)
Calcium: 9.3 mg/dL (ref 8.9–10.3)
Chloride: 106 mmol/L (ref 98–111)
Creatinine, Ser: 0.87 mg/dL (ref 0.61–1.24)
GFR, Estimated: >60 mL/min (ref >60)
Glucose, Bld: 106 mg/dL — ABNORMAL HIGH (ref 70–99)
Potassium: 3.9 mmol/L (ref 3.5–5.1)
Sodium: 140 mmol/L (ref 135–145)
Total Bilirubin: 1.3 mg/dL — ABNORMAL HIGH (ref 0.0–1.2)
Total Protein: 6.6 g/dL (ref 6.5–8.1)

## 2024-04-18 LAB — CBC
HCT: 38.2 % — ABNORMAL LOW (ref 39.0–52.0)
Hemoglobin: 12.8 g/dL — ABNORMAL LOW (ref 13.0–17.0)
MCH: 31.3 pg (ref 26.0–34.0)
MCHC: 33.5 g/dL (ref 30.0–36.0)
MCV: 93.4 fL (ref 80.0–100.0)
Platelets: 129 K/uL — ABNORMAL LOW (ref 150–400)
RBC: 4.09 MIL/uL — ABNORMAL LOW (ref 4.22–5.81)
RDW: 11.6 % (ref 11.5–15.5)
WBC: 3.7 K/uL — ABNORMAL LOW (ref 4.0–10.5)
nRBC: 0 % (ref 0.0–0.2)

## 2024-04-18 LAB — GLUCOSE, CAPILLARY
Glucose-Capillary: 103 mg/dL — ABNORMAL HIGH (ref 70–99)
Glucose-Capillary: 93 mg/dL (ref 70–99)
Glucose-Capillary: 103 mg/dL — ABNORMAL HIGH (ref 70–99)
Glucose-Capillary: 166 mg/dL — ABNORMAL HIGH (ref 70–99)
Glucose-Capillary: 110 mg/dL — ABNORMAL HIGH (ref 70–99)

## 2024-04-18 LAB — MAGNESIUM: Magnesium: 1.9 mg/dL (ref 1.7–2.4)

## 2024-04-18 NOTE — Progress Notes (Signed)
 PROGRESS NOTE  Dustin Hess FMW:[id number] DOB: Jul 25, 1946 DOA: 04/14/2024 PCP: [name], MD   LOS: 4 days   Brief Narrative / Interim history: 78 year old male with history of CAD with PCI in 2005, HTN, hypothyroidism, blindness who comes in from [facility] with increased fatigue, low-grade fever.  Patient had abdominal pain the day prior without nausea or vomiting.  There is no reported cough or chest congestion.  CT scan of the abdomen and pelvis on admission showed possible pulmonary infiltrates as well as dilated gallbladder with some wall edema.  Surgery was consulted, he was placed on antibiotics and admitted to the hospital  Subjective / 24h Interval events: He is feeling well, no abdominal pain, no nausea or vomiting.  Tolerating diet  Assesement and Plan: Principal Problem:   Cholecystitis Active Problems:   Hypothyroidism   Essential hypertension, benign   Benign prostatic hyperplasia   CAD (coronary artery disease) of artery bypass graft   Dyslipidemia   Acute cholecystitis   CAP (community acquired pneumonia)  Principal problem Cholelithiasis, concern for cholecystitis -no significant abdominal pain, gallbladder ultrasound showed stones and sludge.  His LFTs are elevated, and bilirubin is up too, I wonder whether he has been passing some stones.  Surgery following, underwent an MRI/MRCP which showed again raise concern for cholecystitis and there were no signs of choledocholithiasis - Discussed with general surgery team, they recommend patient be treated conservatively with antibiotics - Eating well, LFTs improving, stable to discharge but awaiting insurance authorization  Active problems Possible pneumonia -he does not have any cough or respiratory symptoms.  Continue antibiotics, will treat empirically, total of 5 days  CAD-no chest pain, continue metoprolol , Imdur   Hypothyroidism-continue Synthroid   BPH-continue  tamsulosin   Disposition-awaiting PT eval before returning to [facility], discussed with TOC, probably needs insurance Auth again  Scheduled Meds:  hydrALAZINE   10 mg Oral Q6H   isosorbide  mononitrate  30 mg Oral Daily   levothyroxine   88 mcg Oral Q0600   metoprolol  succinate  25 mg Oral Daily   polyethylene glycol  17 g Oral BID   tamsulosin   0.8 mg Oral Daily   Continuous Infusions:  azithromycin  Stopped (04/17/24 2006)   piperacillin -tazobactam (ZOSYN )  IV 3.375 g (04/18/24 0538)   PRN Meds:.acetaminophen  **OR** acetaminophen , bisacodyl , fentaNYL  (SUBLIMAZE ) injection, HYDROcodone -acetaminophen , ondansetron  **OR** ondansetron  (ZOFRAN ) IV  Current Outpatient Medications  Medication Instructions   acetaminophen  (TYLENOL ) 650 mg, Oral, Every 8 hours PRN   aspirin  81 mg, Daily   atorvastatin  (LIPITOR) 20 mg, Daily   Cholecalciferol (VITAMIN D3) 1000 UNITS CAPS 1 capsule, Daily   dutasteride  (AVODART ) 0.5 mg, Daily   ferrous sulfate  325 mg, Oral, Daily with breakfast   isosorbide  mononitrate (IMDUR ) 30 mg, Oral, Daily   levothyroxine  (SYNTHROID ) 88 mcg, Daily before breakfast   metoprolol  succinate (TOPROL -XL) 25 mg, Oral, Daily, TAKE WITH OR IMMEDIATELY FOLLOWING A MEAL.   Multiple Vitamin (MULTIVITAMIN) tablet 1 tablet, Daily   nitroGLYCERIN  (NITROSTAT ) 0.4 mg, Sublingual, Every 5 min PRN, Must call and schedule appt for future refills   senna-docusate (SENOKOT-S) 8.6-50 MG tablet 1 tablet, Oral, Daily   tamsulosin  (FLOMAX ) 0.8 mg, Daily    Diet Orders (From admission, onward)     Start     Ordered   04/16/24 0906  Diet Heart Room service appropriate? Yes; Fluid consistency: Thin  Diet effective now       Question Answer Comment  Room service appropriate? Yes   Fluid consistency: Thin  04/16/24 0905            DVT prophylaxis: SCDs Start: 04/14/24 2129   Lab Results  Component Value Date   PLT 129 (L) 04/18/2024      Code Status: Full Code  Family  Communication: no family at bedside, updated spouse over the phone  Status is: Inpatient Remains inpatient appropriate because: severity of illness  Level of care: Telemetry Medical  Consultants:  General surgery   Objective: Vitals:   04/17/24 2023 04/17/24 2338 04/18/24 0526 04/18/24 0809  BP: 135/70 (!) 169/85 (!) 160/95 (!) 163/77  Pulse: 89  76 78  Resp: 18   17  Temp: 98.8 F (37.1 C)  98.2 F (36.8 C) (!) 97.5 F (36.4 C)  TempSrc: Oral  Axillary   SpO2: 100%   100%  Weight:      Height:        Intake/Output Summary (Last 24 hours) at 04/18/2024 1108 Last data filed at 04/18/2024 0538 Gross per 24 hour  Intake 730 ml  Output --  Net 730 ml   Wt Readings from Last 3 Encounters:  04/14/24 64.4 kg  03/28/24 64.4 kg  02/24/24 64.4 kg    Examination:  Constitutional: NAD Eyes: lids and conjunctivae normal, no scleral icterus ENMT: mmm Neck: normal, supple Respiratory: clear to auscultation bilaterally, no wheezing, no crackles. Normal respiratory effort.  Cardiovascular: Regular rate and rhythm, no murmurs / rubs / gallops. No LE edema. Abdomen: soft, no distention, no tenderness. Bowel sounds positive.    Data Reviewed: I have independently reviewed following labs and imaging studies   CBC Recent Labs  Lab 04/14/24 1343 04/15/24 0257 04/16/24 0559 04/17/24 0415 04/18/24 0756  WBC 8.6 8.1 5.2 4.9 3.7*  HGB 13.7 12.5* 13.7 13.0 12.8*  HCT 41.1 37.8* 41.5 38.6* 38.2*  PLT 159 135* 133* 129* 129*  MCV 95.4 94.7 94.5 93.0 93.4  MCH 31.8 31.3 31.2 31.3 31.3  MCHC 33.3 33.1 33.0 33.7 33.5  RDW 11.6 11.8 11.7 11.5 11.6  LYMPHSABS 0.2*  --   --   --   --   MONOABS 0.6  --   --   --   --   EOSABS 0.0  --   --   --   --   BASOSABS 0.0  --   --   --   --     Recent Labs  Lab 04/14/24 1343 04/14/24 1440 04/14/24 1537 04/15/24 0257 04/16/24 0559 04/17/24 0415 04/18/24 0756  NA 143  --   --  141 143 140 140  K 3.8  --   --  3.8 3.6 3.4* 3.9  CL  109  --   --  109 107 107 106  CO2 23  --   --  24 25 25 24   GLUCOSE 109*  --   --  90 100* 98 106*  BUN 20  --   --  18 18 10 10   CREATININE 0.87  --   --  0.91 0.93 0.83 0.87  CALCIUM  9.5  --   --  9.3 9.6 9.2 9.3  AST 488*  --   --  224* 108* 81* 67*  ALT 368*  --   --  319* 232* 169* 138*  ALKPHOS 155*  --   --  154* 145* 130* 123  BILITOT 3.3*  --   --  3.5* 2.3* 2.0* 1.3*  ALBUMIN 3.6  --   --  3.1* 3.3* 3.0* 3.0*  MG  --   --   --  2.0 1.8 1.7 1.9  PROCALCITON  --   --   --  0.13  --   --   --   LATICACIDVEN  --  0.9 0.7  --   --   --   --   INR 1.3*  --   --   --   --   --   --   TSH  --   --   --  2.168  --   --   --   HGBA1C  --   --   --  4.6*  --   --   --     ------------------------------------------------------------------------------------------------------------------ No results for input(s): CHOL, HDL, LDLCALC, TRIG, CHOLHDL, LDLDIRECT in the last 72 hours.  Lab Results  Component Value Date   HGBA1C 4.6 (L) 04/15/2024   ------------------------------------------------------------------------------------------------------------------ No results for input(s): TSH, T4TOTAL, T3FREE, THYROIDAB in the last 72 hours.  Invalid input(s): FREET3   Cardiac Enzymes No results for input(s): CKMB, TROPONINI, MYOGLOBIN in the last 168 hours.  Invalid input(s): CK ------------------------------------------------------------------------------------------------------------------    Component Value Date/Time   BNP 297.9 (H) 08/28/2023 1744    CBG: Recent Labs  Lab 04/17/24 1218 04/17/24 1637 04/17/24 2054 04/18/24 0458 04/18/24 0811  GLUCAP 120* 170* 106* 103* 93    Recent Results (from the past 240 hours)  Culture, blood (Routine x 2)     Status: None (Preliminary result)   Collection Time: 04/14/24  2:50 PM   Specimen: BLOOD RIGHT HAND  Result Value Ref Range Status   Specimen Description BLOOD RIGHT HAND  Final   Special  Requests   Final    AEROBIC BOTTLE ONLY Blood Culture results may not be optimal due to an inadequate volume of blood received in culture bottles   Culture   Final    NO GROWTH 4 DAYS Performed at [facility], [address] [place], [place] [address]    Report Status PENDING  Incomplete  Resp panel by RT-PCR (RSV, Flu A&B, Covid) Anterior Nasal Swab     Status: None   Collection Time: 04/14/24  3:11 PM   Specimen: Anterior Nasal Swab  Result Value Ref Range Status   SARS Coronavirus 2 by RT PCR NEGATIVE NEGATIVE Final   Influenza A by PCR NEGATIVE NEGATIVE Final   Influenza B by PCR NEGATIVE NEGATIVE Final    Comment: (NOTE) The Xpert Xpress SARS-CoV-2/FLU/RSV plus assay is intended as an aid in the diagnosis of influenza from Nasopharyngeal swab specimens and should not be used as a sole basis for treatment. Nasal washings and aspirates are unacceptable for Xpert Xpress SARS-CoV-2/FLU/RSV testing.  Fact Sheet for Patients: BloggerCourse.com  Fact Sheet for Healthcare Providers: SeriousBroker.it  This test is not yet approved or cleared by the [place]  FDA and has been authorized for detection and/or diagnosis of SARS-CoV-2 by FDA under an Emergency Use Authorization (EUA). This EUA will remain in effect (meaning this test can be used) for the duration of the COVID-19 declaration under Section 564(b)(1) of the Act, 21 U.S.C. section 360bbb-3(b)(1), unless the authorization is terminated or revoked.     Resp Syncytial Virus by PCR NEGATIVE NEGATIVE Final    Comment: (NOTE) Fact Sheet for Patients: BloggerCourse.com  Fact Sheet for Healthcare Providers: SeriousBroker.it  This test is not yet approved or cleared by the [place]  FDA and has been authorized for detection and/or diagnosis of SARS-CoV-2 by FDA under an Emergency Use Authorization (EUA). This EUA  will remain in effect (  meaning this test can be used) for the duration of the COVID-19 declaration under Section 564(b)(1) of the Act, 21 U.S.C. section 360bbb-3(b)(1), unless the authorization is terminated or revoked.  Performed at [facility], [address] [place], [place] [address]   Culture, blood (Routine x 2)     Status: None (Preliminary result)   Collection Time: 04/15/24  4:15 AM   Specimen: BLOOD  Result Value Ref Range Status   Specimen Description BLOOD SITE NOT SPECIFIED  Final   Special Requests   Final    BOTTLES DRAWN AEROBIC AND ANAEROBIC Blood Culture adequate volume   Culture   Final    NO GROWTH 3 DAYS Performed at [facility], [address], [place], [place] [address]    Report Status PENDING  Incomplete     Radiology Studies: No results found.  Nilda Fendt, MD, PhD [facility]  Between 7 am - 7 pm I am available, please contact me via Amion (for emergencies) or Securechat (non urgent messages)  Between 7 pm - 7 am I am not available, please contact night coverage MD/APP via Amion

## 2024-04-18 NOTE — Plan of Care (Signed)
  Problem: Coping: Goal: Ability to adjust to condition or change in health will improve Outcome: Progressing   Problem: Health Behavior/Discharge Planning: Goal: Ability to manage health-related needs will improve Outcome: Progressing   Problem: Clinical Measurements: Goal: Will remain free from infection Outcome: Progressing

## 2024-04-18 NOTE — Plan of Care (Signed)

## 2024-04-19 DIAGNOSIS — K819 Cholecystitis, unspecified: Secondary | ICD-10-CM | POA: Diagnosis not present

## 2024-04-19 LAB — GLUCOSE, CAPILLARY
Glucose-Capillary: 100 mg/dL — ABNORMAL HIGH (ref 70–99)
Glucose-Capillary: 120 mg/dL — ABNORMAL HIGH (ref 70–99)
Glucose-Capillary: 123 mg/dL — ABNORMAL HIGH (ref 70–99)
Glucose-Capillary: 130 mg/dL — ABNORMAL HIGH (ref 70–99)
Glucose-Capillary: 93 mg/dL (ref 70–99)
Glucose-Capillary: 96 mg/dL (ref 70–99)

## 2024-04-19 LAB — CULTURE, BLOOD (ROUTINE X 2): Culture: NO GROWTH

## 2024-04-19 MED ORDER — SODIUM CHLORIDE 0.9 % IV SOLN
500.0000 mg | INTRAVENOUS | Status: AC
  Administered 2024-04-19: 500 mg via INTRAVENOUS
  Filled 2024-04-19: qty 5

## 2024-04-19 MED ORDER — AMOXICILLIN-POT CLAVULANATE 875-125 MG PO TABS: 1.0000 | ORAL_TABLET | Freq: Two times a day (BID) | ORAL | Status: DC

## 2024-04-19 NOTE — Plan of Care (Signed)
  Problem: Nutritional: Goal: Maintenance of adequate nutrition will improve Outcome: Progressing   Problem: Skin Integrity: Goal: Risk for impaired skin integrity will decrease Outcome: Progressing   Problem: Clinical Measurements: Goal: Will remain free from infection Outcome: Progressing

## 2024-04-19 NOTE — Progress Notes (Signed)
 PROGRESS NOTE  Dustin Hess FMW:[id number] DOB: 1946-06-18 DOA: 04/14/2024 PCP: [name], MD   LOS: 5 days   Brief Narrative / Interim history: 78 year old male with history of CAD with PCI in 2005, HTN, hypothyroidism, blindness who comes in from [facility] with increased fatigue, low-grade fever.  Patient had abdominal pain the day prior without nausea or vomiting.  There is no reported cough or chest congestion.  CT scan of the abdomen and pelvis on admission showed possible pulmonary infiltrates as well as dilated gallbladder with some wall edema.  Surgery was consulted, he was placed on antibiotics and admitted to the hospital  Subjective / 24h Interval events: He is feeling well, no abdominal pain, no nausea or vomiting.  Tolerating diet  Assesement and Plan: Principal Problem:   Cholecystitis Active Problems:   Hypothyroidism   Essential hypertension, benign   Benign prostatic hyperplasia   CAD (coronary artery disease) of artery bypass graft   Dyslipidemia   Acute cholecystitis   CAP (community acquired pneumonia)  Principal problem Principal problem Cholelithiasis, concern for cholecystitis -no significant abdominal pain, gallbladder ultrasound showed stones and sludge.  His LFTs are elevated, and bilirubin is up too, I wonder whether he has been passing some stones.  Surgery following, underwent an MRI/MRCP which showed again raise concern for cholecystitis and there were no signs of choledocholithiasis.  Discussed with general surgery team, they recommend patient be treated conservatively with antibiotics. He is now eating well, LFTs improving, stable to discharge with outpatient follow up   Active problems Possible pneumonia -he does not have any cough or respiratory symptoms. He is receiving antibiotics for #1 CAD-no chest pain, continue metoprolol , Imdur  Hypothyroidism-continue Synthroid  BPH-continue tamsulosin   Disposition-awaiting PT eval before  returning to [facility], discussed with TOC, awaiting insurance auth  Scheduled Meds:  hydrALAZINE   10 mg Oral Q6H   isosorbide  mononitrate  30 mg Oral Daily   levothyroxine   88 mcg Oral Q0600   metoprolol  succinate  25 mg Oral Daily   polyethylene glycol  17 g Oral BID   tamsulosin   0.8 mg Oral Daily   Continuous Infusions:  azithromycin      piperacillin -tazobactam (ZOSYN )  IV 3.375 g (04/19/24 1333)   PRN Meds:.acetaminophen  **OR** acetaminophen , bisacodyl , fentaNYL  (SUBLIMAZE ) injection, HYDROcodone -acetaminophen , ondansetron  **OR** ondansetron  (ZOFRAN ) IV  Current Outpatient Medications  Medication Instructions   acetaminophen  (TYLENOL ) 650 mg, Oral, Every 8 hours PRN   amoxicillin -clavulanate (AUGMENTIN ) 875-125 MG tablet 1 tablet, Oral, 2 times daily   aspirin  81 mg, Daily   atorvastatin  (LIPITOR) 20 mg, Daily   Cholecalciferol (VITAMIN D3) 1000 UNITS CAPS 1 capsule, Daily   dutasteride  (AVODART ) 0.5 mg, Daily   ferrous sulfate  325 mg, Oral, Daily with breakfast   isosorbide  mononitrate (IMDUR ) 30 mg, Oral, Daily   levothyroxine  (SYNTHROID ) 88 mcg, Daily before breakfast   metoprolol  succinate (TOPROL -XL) 25 mg, Oral, Daily, TAKE WITH OR IMMEDIATELY FOLLOWING A MEAL.   Multiple Vitamin (MULTIVITAMIN) tablet 1 tablet, Daily   nitroGLYCERIN  (NITROSTAT ) 0.4 mg, Sublingual, Every 5 min PRN, Must call and schedule appt for future refills   senna-docusate (SENOKOT-S) 8.6-50 MG tablet 1 tablet, Oral, Daily   tamsulosin  (FLOMAX ) 0.8 mg, Daily    Diet Orders (From admission, onward)     Start     Ordered   04/19/24 0845  DIET DYS 2 Fluid consistency: Thin  Diet effective now       Question:  Fluid consistency:  Answer:  Thin   04/19/24  0845            DVT prophylaxis: SCDs Start: 04/14/24 2129   Lab Results  Component Value Date   PLT 129 (L) 04/18/2024      Code Status: Full Code  Family Communication: no family at bedside, updated spouse over the  phone  Status is: Inpatient Remains inpatient appropriate because: severity of illness  Level of care: Telemetry Medical  Consultants:  General surgery   Objective: Vitals:   04/18/24 1958 04/19/24 0225 04/19/24 0234 04/19/24 0805  BP: (!) 162/83 (!) 160/91 (!) 160/91 (!) 150/77  Pulse: 77 78  89  Resp: 16   16  Temp: 98.2 F (36.8 C)   98.7 F (37.1 C)  TempSrc: Oral   Oral  SpO2: 100%   100%  Weight:      Height:        Intake/Output Summary (Last 24 hours) at 04/19/2024 1538 Last data filed at 04/19/2024 1402 Gross per 24 hour  Intake 844 ml  Output 2300 ml  Net -1456 ml   Wt Readings from Last 3 Encounters:  04/14/24 64.4 kg  03/28/24 64.4 kg  02/24/24 64.4 kg    Examination:  Constitutional: NAD Eyes: lids and conjunctivae normal, no scleral icterus ENMT: mmm Neck: normal, supple Respiratory: clear to auscultation bilaterally, no wheezing, no crackles. Normal respiratory effort.  Cardiovascular: Regular rate and rhythm, no murmurs / rubs / gallops. No LE edema. Abdomen: soft, no distention, no tenderness. Bowel sounds positive.    Data Reviewed: I have independently reviewed following labs and imaging studies   CBC Recent Labs  Lab 04/14/24 1343 04/15/24 0257 04/16/24 0559 04/17/24 0415 04/18/24 0756  WBC 8.6 8.1 5.2 4.9 3.7*  HGB 13.7 12.5* 13.7 13.0 12.8*  HCT 41.1 37.8* 41.5 38.6* 38.2*  PLT 159 135* 133* 129* 129*  MCV 95.4 94.7 94.5 93.0 93.4  MCH 31.8 31.3 31.2 31.3 31.3  MCHC 33.3 33.1 33.0 33.7 33.5  RDW 11.6 11.8 11.7 11.5 11.6  LYMPHSABS 0.2*  --   --   --   --   MONOABS 0.6  --   --   --   --   EOSABS 0.0  --   --   --   --   BASOSABS 0.0  --   --   --   --     Recent Labs  Lab 04/14/24 1343 04/14/24 1440 04/14/24 1537 04/15/24 0257 04/16/24 0559 04/17/24 0415 04/18/24 0756  NA 143  --   --  141 143 140 140  K 3.8  --   --  3.8 3.6 3.4* 3.9  CL 109  --   --  109 107 107 106  CO2 23  --   --  24 25 25 24   GLUCOSE 109*   --   --  90 100* 98 106*  BUN 20  --   --  18 18 10 10   CREATININE 0.87  --   --  0.91 0.93 0.83 0.87  CALCIUM  9.5  --   --  9.3 9.6 9.2 9.3  AST 488*  --   --  224* 108* 81* 67*  ALT 368*  --   --  319* 232* 169* 138*  ALKPHOS 155*  --   --  154* 145* 130* 123  BILITOT 3.3*  --   --  3.5* 2.3* 2.0* 1.3*  ALBUMIN 3.6  --   --  3.1* 3.3* 3.0* 3.0*  MG  --   --   --  2.0 1.8 1.7 1.9  PROCALCITON  --   --   --  0.13  --   --   --   LATICACIDVEN  --  0.9 0.7  --   --   --   --   INR 1.3*  --   --   --   --   --   --   TSH  --   --   --  2.168  --   --   --   HGBA1C  --   --   --  4.6*  --   --   --     ------------------------------------------------------------------------------------------------------------------ No results for input(s): CHOL, HDL, LDLCALC, TRIG, CHOLHDL, LDLDIRECT in the last 72 hours.  Lab Results  Component Value Date   HGBA1C 4.6 (L) 04/15/2024   ------------------------------------------------------------------------------------------------------------------ No results for input(s): TSH, T4TOTAL, T3FREE, THYROIDAB in the last 72 hours.  Invalid input(s): FREET3   Cardiac Enzymes No results for input(s): CKMB, TROPONINI, MYOGLOBIN in the last 168 hours.  Invalid input(s): CK ------------------------------------------------------------------------------------------------------------------    Component Value Date/Time   BNP 297.9 (H) 08/28/2023 1744    CBG: Recent Labs  Lab 04/18/24 2105 04/19/24 0011 04/19/24 0522 04/19/24 0801 04/19/24 1140  GLUCAP 110* 130* 96 100* 93    Recent Results (from the past 240 hours)  Culture, blood (Routine x 2)     Status: None   Collection Time: 04/14/24  2:50 PM   Specimen: BLOOD RIGHT HAND  Result Value Ref Range Status   Specimen Description BLOOD RIGHT HAND  Final   Special Requests   Final    AEROBIC BOTTLE ONLY Blood Culture results may not be optimal due to an inadequate  volume of blood received in culture bottles   Culture   Final    NO GROWTH 5 DAYS Performed at [facility], [address] [place], [place] [address]    Report Status 04/19/2024 FINAL  Final  Resp panel by RT-PCR (RSV, Flu A&B, Covid) Anterior Nasal Swab     Status: None   Collection Time: 04/14/24  3:11 PM   Specimen: Anterior Nasal Swab  Result Value Ref Range Status   SARS Coronavirus 2 by RT PCR NEGATIVE NEGATIVE Final   Influenza A by PCR NEGATIVE NEGATIVE Final   Influenza B by PCR NEGATIVE NEGATIVE Final    Comment: (NOTE) The Xpert Xpress SARS-CoV-2/FLU/RSV plus assay is intended as an aid in the diagnosis of influenza from Nasopharyngeal swab specimens and should not be used as a sole basis for treatment. Nasal washings and aspirates are unacceptable for Xpert Xpress SARS-CoV-2/FLU/RSV testing.  Fact Sheet for Patients: BloggerCourse.com  Fact Sheet for Healthcare Providers: SeriousBroker.it  This test is not yet approved or cleared by the [place]  FDA and has been authorized for detection and/or diagnosis of SARS-CoV-2 by FDA under an Emergency Use Authorization (EUA). This EUA will remain in effect (meaning this test can be used) for the duration of the COVID-19 declaration under Section 564(b)(1) of the Act, 21 U.S.C. section 360bbb-3(b)(1), unless the authorization is terminated or revoked.     Resp Syncytial Virus by PCR NEGATIVE NEGATIVE Final    Comment: (NOTE) Fact Sheet for Patients: BloggerCourse.com  Fact Sheet for Healthcare Providers: SeriousBroker.it  This test is not yet approved or cleared by the [place]  FDA and has been authorized for detection and/or diagnosis of SARS-CoV-2 by FDA under an Emergency Use Authorization (EUA). This EUA will remain in effect (meaning this  test can be used) for the duration of the COVID-19  declaration under Section 564(b)(1) of the Act, 21 U.S.C. section 360bbb-3(b)(1), unless the authorization is terminated or revoked.  Performed at [facility], [address], [place], [place] [address]   Culture, blood (Routine x 2)     Status: None (Preliminary result)   Collection Time: 04/15/24  4:15 AM   Specimen: BLOOD  Result Value Ref Range Status   Specimen Description BLOOD SITE NOT SPECIFIED  Final   Special Requests   Final    BOTTLES DRAWN AEROBIC AND ANAEROBIC Blood Culture adequate volume   Culture   Final    NO GROWTH 4 DAYS Performed at [facility], [address] [place], [place] [address]    Report Status PENDING  Incomplete     Radiology Studies: No results found.  Nilda Fendt, MD, PhD [facility]  Between 7 am - 7 pm I am available, please contact me via Amion (for emergencies) or Securechat (non urgent messages)  Between 7 pm - 7 am I am not available, please contact night coverage MD/APP via Amion

## 2024-04-19 NOTE — Progress Notes (Signed)
 Speech Language Pathology Treatment: Dysphagia  Patient Details Name: Dustin Hess MRN: [id number] DOB: 1946-05-16 Today's Date: 04/19/2024 Time: 9161-9145 SLP Time Calculation (min) (ACUTE ONLY): 16 min  Assessment / Plan / Recommendation Clinical Impression  SLP conducted skilled therapy session targeting dysphagia goals. Patient underwent bedside swallow evaluation 7/17 with recommendations for Dys1/thin liquid diet, however upon placement of diet by MD after surgery, MD entered regular/thin liquid diet into chart, thus upon SLP entry, patient consuming regular/thin liquid items from breakfast tray with assistance of NT. SLP provided education re: results from recent MBS during previous admission and recommendations for modified solids due to pharyngeal efficiency deficits. Patient verbalized understanding. During consumption of solids, patient exhibited prolonged mastication, oral holding, and impaired oral preparation, though this was improved with consumption of minced (Dys2) type items. RN administered medications whole with thin liquids during session as well with patient requiring liquid wash to clear from the oral cavity. Throughout meal, patient demonstrated intermittent cough aligned with that observed during previous hospital stay and likely thus due to pharyngeal residue. Recommend Dys2/thin liquid diet utilizing multiple swallows after each bite and liquid wash. Administer medications whole with liquid if patient continues to tolerate, however cut large pills in half to promote pharyngeal clearance. Patient was left in room with call bell in reach and alarm set. SLP will continue to target goals per plan of care.      HPI HPI: 78 y.o. male was brought to the ER after patient was found with fever and weakness with concerns for gallbladder wall thickening and edema. Previously (03/28/24), presented to ER for weakness, not eating well, lethargic with some confusion for the last 2 to 3  days, dx with acute metabolic encephalopathy. PMH history of CAD status post PCI in 2005, hypertension, hypothyroidism, blindness, anemia, BPH .      SLP Plan  Continue with current plan of care          Recommendations  Diet recommendations: Dysphagia 2 (fine chop);Thin liquid Liquids provided via: Straw;Cup Medication Administration: Whole meds with liquid (cut large pills in half) Supervision: Full supervision/cueing for compensatory strategies Compensations: Slow rate;Small sips/bites;Follow solids with liquid;Multiple dry swallows after each bite/sip Postural Changes and/or Swallow Maneuvers: Seated upright 90 degrees;Upright 30-60 min after meal                  Oral care BID   Frequent or constant Supervision/Assistance Dysphagia, oropharyngeal phase (R13.12)     Continue with current plan of care   Rosina Downy, M.A., CCC-SLP   Rosina A Giuseppina Quinones  04/19/2024, 11:03 AM

## 2024-04-19 NOTE — Discharge Summary (Deleted)
 Physician Discharge Summary  Dustin Hess FMW:[id number] DOB: 10-19-1945 DOA: 04/14/2024  PCP: [name], MD  Admit date: 04/14/2024 Discharge date: 04/19/2024  Admitted From: SNF Disposition:  SNF  Recommendations for Outpatient Follow-up:  Follow up with PCP in 1-2 weeks Continue Augmentin  for 4 more days  Home Health: none Equipment/Devices: none  Discharge Condition: stable CODE STATUS: Full code Diet Orders (From admission, onward)     Start     Ordered   04/19/24 0845  DIET DYS 2 Fluid consistency: Thin  Diet effective now       Question:  Fluid consistency:  Answer:  Thin   04/19/24 0845            Brief Narrative / Interim history: 78 year old male with history of CAD with PCI in 2005, HTN, hypothyroidism, blindness who comes in from [facility] with increased fatigue, low-grade fever.  Patient had abdominal pain the day prior without nausea or vomiting.  There is no reported cough or chest congestion.  CT scan of the abdomen and pelvis on admission showed possible pulmonary infiltrates as well as dilated gallbladder with some wall edema.  Surgery was consulted, he was placed on antibiotics and admitted to the hospital  Hospital Course / Discharge diagnoses: Principal Problem:   Cholecystitis Active Problems:   Hypothyroidism   Essential hypertension, benign   Benign prostatic hyperplasia   CAD (coronary artery disease) of artery bypass graft   Dyslipidemia   Acute cholecystitis   CAP (community acquired pneumonia)   Principal problem Cholelithiasis, concern for cholecystitis -no significant abdominal pain, gallbladder ultrasound showed stones and sludge.  His LFTs are elevated, and bilirubin is up too, I wonder whether he has been passing some stones.  Surgery following, underwent an MRI/MRCP which showed again raise concern for cholecystitis and there were no signs of choledocholithiasis.  Discussed with general surgery team, they recommend  patient be treated conservatively with antibiotics. He is now eating well, LFTs improving, stable to discharge with outpatient follow up   Active problems Possible pneumonia -he does not have any cough or respiratory symptoms. He is receiving antibiotics for #1 CAD-no chest pain, continue metoprolol , Imdur  Hypothyroidism-continue Synthroid  BPH-continue tamsulosin   Sepsis ruled out   Discharge Instructions   Allergies as of 04/19/2024       Reactions   Benadryl [diphenhydramine Hcl] Palpitations   2014  Took one tablet and had heart palpitations        Medication List     TAKE these medications    acetaminophen  325 MG tablet Commonly known as: TYLENOL  Take 650 mg by mouth every 8 (eight) hours as needed for mild pain (pain score 1-3).   amoxicillin -clavulanate 875-125 MG tablet Commonly known as: AUGMENTIN  Take 1 tablet by mouth 2 (two) times daily for 4 days.   aspirin  81 MG tablet Take 81 mg by mouth daily.   atorvastatin  20 MG tablet Commonly known as: LIPITOR Take 20 mg by mouth daily.   dutasteride  0.5 MG capsule Commonly known as: AVODART  Take 0.5 mg by mouth daily.   ferrous sulfate  325 (65 FE) MG EC tablet Take 1 tablet (325 mg total) by mouth daily with breakfast.   isosorbide  mononitrate 30 MG 24 hr tablet Commonly known as: IMDUR  Take 1 tablet (30 mg total) by mouth daily.   levothyroxine  88 MCG tablet Commonly known as: SYNTHROID  Take 88 mcg by mouth daily before breakfast.   metoprolol  succinate 25 MG 24 hr tablet Commonly known as:  TOPROL -XL Take 1 tablet (25 mg total) by mouth daily. TAKE WITH OR IMMEDIATELY FOLLOWING A MEAL.   multivitamin tablet Take 1 tablet by mouth daily.   nitroGLYCERIN  0.4 MG SL tablet Commonly known as: NITROSTAT  Place 1 tablet (0.4 mg total) under the tongue every 5 (five) minutes as needed. Must call and schedule appt for future refills   senna-docusate 8.6-50 MG tablet Commonly known as: Senokot-S Take 1  tablet by mouth daily.   tamsulosin  0.4 MG Caps capsule Commonly known as: FLOMAX  Take 0.8 mg by mouth daily.   Vitamin D3 25 MCG (1000 UT) Caps Take 1 capsule by mouth daily.       Consultations: General surgery   Procedures/Studies:  ECHOCARDIOGRAM COMPLETE Result Date: 04/15/2024    ECHOCARDIOGRAM REPORT   Patient Name:   Dustin Hess Date of Exam: 04/15/2024 Medical Rec #:  [id number]              Height:       63.0 in Accession #:    [id number]             Weight:       142.0 lb Date of Birth:  1946/03/12              BSA:          1.672 m Patient Age:    78 years               BP:           152/79 mmHg Patient Gender: M                      HR:           75 bpm. Exam Location:  Inpatient Procedure: 2D Echo, Cardiac Doppler and Color Doppler (Both Spectral and Color            Flow Doppler were utilized during procedure). Indications:    CHF  History:        Patient has prior history of Echocardiogram examinations, most                 recent 10/03/2021. Risk Factors:Hypertension and Dyslipidemia.  Sonographer:    Philomena Daring Referring Phys: [id number] ANASTASSIA DOUTOVA  Sonographer Comments: Technically difficult study due to poor echo windows. Image acquisition challenging due to patient body habitus. IMPRESSIONS  1. Hyperdynamic LV, not well interrogated but appears to be an intercavitary gradient of around . Left ventricular ejection fraction, by estimation, is 70 to 75%. The left ventricle has hyperdynamic function. The left ventricle has no regional wall motion abnormalities. Left ventricular diastolic parameters are consistent with Grade I diastolic dysfunction (impaired relaxation).  2. Right ventricular systolic function is normal. The right ventricular size is normal.  3. The mitral valve is normal in structure. No evidence of mitral valve regurgitation. No evidence of mitral stenosis.  4. The aortic valve is normal in structure. Aortic valve regurgitation is not visualized.  No aortic stenosis is present.  5. The inferior vena cava is normal in size with greater than 50% respiratory variability, suggesting right atrial pressure of 3 mmHg. FINDINGS  Left Ventricle: Hyperdynamic LV, not well interrogated but appears to be an intercavitary gradient of around . Left ventricular ejection fraction, by estimation, is 70 to 75%. The left ventricle has hyperdynamic function. The left ventricle has no  regional wall motion abnormalities. The left ventricular internal cavity size was normal in size. There is  no left ventricular hypertrophy. Left ventricular diastolic parameters are consistent with Grade I diastolic dysfunction (impaired relaxation). Right Ventricle: The right ventricular size is normal. No increase in right ventricular wall thickness. Right ventricular systolic function is normal. Left Atrium: Left atrial size was normal in size. Right Atrium: Right atrial size was normal in size. Pericardium: There is no evidence of pericardial effusion. Mitral Valve: The mitral valve is normal in structure. No evidence of mitral valve regurgitation. No evidence of mitral valve stenosis. Tricuspid Valve: The tricuspid valve is normal in structure. Tricuspid valve regurgitation is not demonstrated. No evidence of tricuspid stenosis. Aortic Valve: The aortic valve is normal in structure. Aortic valve regurgitation is not visualized. No aortic stenosis is present. Pulmonic Valve: The pulmonic valve was normal in structure. Pulmonic valve regurgitation is not visualized. No evidence of pulmonic stenosis. Aorta: The aortic root is normal in size and structure. Venous: The inferior vena cava is normal in size with greater than 50% respiratory variability, suggesting right atrial pressure of 3 mmHg. IAS/Shunts: No atrial level shunt detected by color flow Doppler.  LEFT VENTRICLE PLAX 2D LVIDd:         4.00 cm   Diastology LVIDs:         2.50 cm   LV e' medial:    4.46 cm/s LV PW:         1.10 cm    LV E/e' medial:  11.9 LV IVS:        0.90 cm   LV e' lateral:   4.90 cm/s LVOT diam:     1.90 cm   LV E/e' lateral: 10.8 LV SV:         82 LV SV Index:   49 LVOT Area:     2.84 cm  RIGHT VENTRICLE             IVC RV S prime:     13.70 cm/s  IVC diam: 1.40 cm TAPSE (M-mode): 1.7 cm LEFT ATRIUM             Index        RIGHT ATRIUM          Index LA diam:        2.90 cm 1.73 cm/m   RA Area:     8.55 cm LA Vol (A2C):   26.0 ml 15.55 ml/m  RA Volume:   16.10 ml 9.63 ml/m LA Vol (A4C):   29.4 ml 17.59 ml/m LA Biplane Vol: 28.9 ml 17.29 ml/m  AORTIC VALVE LVOT Vmax:   135.00 cm/s LVOT Vmean:  90.700 cm/s LVOT VTI:    0.289 m  AORTA Ao Root diam: 2.80 cm MITRAL VALVE MV Area (PHT): 3.77 cm    SHUNTS MV Decel Time: 201 msec    Systemic VTI:  0.29 m MV E velocity: 52.90 cm/s  Systemic Diam: 1.90 cm MV A velocity: 96.90 cm/s MV E/A ratio:  0.55 Morene Brownie Electronically signed by Morene Brownie Signature Date/Time: 04/15/2024/6:09:34 PM    Final    MR ABDOMEN MRCP W WO CONTAST Result Date: 04/15/2024 CLINICAL DATA:  Gallbladder wall thickening/edema and cholelithiasis. Elevated liver function tests. EXAM: MRI ABDOMEN WITHOUT AND WITH CONTRAST (INCLUDING MRCP) TECHNIQUE: Multiplanar multisequence MR imaging of the abdomen was performed both before and after the administration of intravenous contrast. Heavily T2-weighted images of the biliary and pancreatic ducts were obtained, and three-dimensional MRCP images were rendered by post processing. CONTRAST:  6mL GADAVIST  GADOBUTROL  1 MMOL/ML IV SOLN COMPARISON:  Multiple exams, including CT and ultrasound exams from 04/14/2024 FINDINGS: Despite efforts by the technologist and patient, motion artifact is present on today's exam and could not be eliminated. This reduces exam sensitivity and specificity. Lower chest: Hazy signal posteriorly in the lower lobes, right greater than left, corresponding with the ground-glass opacities on CT scan. Hepatobiliary: Abnormal  gallbladder wall thickening with layering small gallstones in the gallbladder. No biliary dilatation or definite filling defect in the common bile duct or common hepatic duct. No significant abnormal focal hepatic lesion is identified. Pancreas:  Unremarkable Spleen:  Unremarkable Adrenals/Urinary Tract: 1.2 cm benign Bosniak category 2 cyst of the right kidney upper pole has a single thin internal septation. Benign Bosniak category 1 cyst of the left kidney upper pole measuring 0.8 cm in diameter on image 20 series 12. Benign 0.7 cm left kidney lower pole Bosniak category 1 cyst. No further imaging workup of these lesions is indicated. Adrenal glands unremarkable. Stomach/Bowel: Prominent stool throughout the colon favors constipation. Vascular/Lymphatic: Atherosclerosis is present, including aortoiliac atherosclerotic disease. 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 Other: Trace nonspecific edema deep to both iliacus muscles in the pelvis. Musculoskeletal: Mild lumbar spondylosis and degenerative disc disease. IMPRESSION: 1. Abnormal gallbladder wall thickening with layering small gallstones in the gallbladder. Acute cholecystitis not excluded. No biliary dilatation or definite filling defect in the common bile duct or common hepatic duct. 2. Prominent stool throughout the colon favors constipation. 3. 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 4. Hazy signal posteriorly in the lower lobes, right greater than left, corresponding with the ground-glass opacities on CT scan. 5. Mild lumbar spondylosis and degenerative disc disease. 6. Trace nonspecific edema deep to both iliacus muscles in the pelvis. Electronically Signed   By: Ryan Salvage M.D.   On: 04/15/2024 16:12   MR 3D Recon At Scanner Result Date: 04/15/2024 CLINICAL DATA:  Gallbladder wall  thickening/edema and cholelithiasis. Elevated liver function tests. EXAM: MRI ABDOMEN WITHOUT AND WITH CONTRAST (INCLUDING MRCP) TECHNIQUE: Multiplanar multisequence MR imaging of the abdomen was performed both before and after the administration of intravenous contrast. Heavily T2-weighted images of the biliary and pancreatic ducts were obtained, and three-dimensional MRCP images were rendered by post processing. CONTRAST:  6mL GADAVIST  GADOBUTROL  1 MMOL/ML IV SOLN COMPARISON:  Multiple exams, including CT and ultrasound exams from 04/14/2024 FINDINGS: Despite efforts by the technologist and patient, motion artifact is present on today's exam and could not be eliminated. This reduces exam sensitivity and specificity. Lower chest: Hazy signal posteriorly in the lower lobes, right greater than left, corresponding with the ground-glass opacities on CT scan. Hepatobiliary: Abnormal gallbladder wall thickening with layering small gallstones in the gallbladder. No biliary dilatation or definite filling defect in the common bile duct or common hepatic duct. No significant abnormal focal hepatic lesion is identified. Pancreas:  Unremarkable Spleen:  Unremarkable Adrenals/Urinary Tract: 1.2 cm benign Bosniak category 2 cyst of the right kidney upper pole has a single thin internal septation. Benign Bosniak category 1 cyst of the left kidney upper pole measuring 0.8 cm in diameter on image 20 series 12. Benign 0.7 cm left kidney lower pole Bosniak category 1 cyst. No further imaging workup of these lesions is indicated. Adrenal glands unremarkable. Stomach/Bowel: Prominent stool throughout the colon favors constipation. Vascular/Lymphatic: Atherosclerosis is present, including aortoiliac atherosclerotic disease. 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 Other: Trace nonspecific edema deep to both iliacus muscles in the pelvis. Musculoskeletal: Mild  lumbar spondylosis and degenerative disc disease. IMPRESSION: 1. Abnormal gallbladder wall thickening with layering small gallstones in the gallbladder. Acute cholecystitis not excluded. No biliary dilatation or definite filling defect in the common bile duct or common hepatic duct. 2. Prominent stool throughout the colon favors constipation. 3. 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 4. Hazy signal posteriorly in the lower lobes, right greater than left, corresponding with the ground-glass opacities on CT scan. 5. Mild lumbar spondylosis and degenerative disc disease. 6. Trace nonspecific edema deep to both iliacus muscles in the pelvis. Electronically Signed   By: Ryan Salvage M.D.   On: 04/15/2024 16:12   US  Abdomen Limited RUQ (LIVER/GB) Result Date: 04/14/2024 CLINICAL DATA:  Cholecystitis EXAM: ULTRASOUND ABDOMEN LIMITED RIGHT UPPER QUADRANT COMPARISON:  Ultrasound 03/29/2024.  CT 04/14/2024 earlier FINDINGS: Gallbladder: Dilated gallbladder with significant wall thickening, new from previous. Dependent stones and tumefactive sludge. No reported Murphy's sign reported however the patient was not awake. Wall thickness approaches 7 mm. Common bile duct: Diameter: 2 mm Liver: No focal lesion identified. Within normal limits in parenchymal echogenicity. Portal vein is patent on color Doppler imaging with normal direction of blood flow towards the liver. Other: None. IMPRESSION: New gallbladder wall thickening and edema. Stones and sludge identified. Please correlate for other clinical findings of acute cholecystitis. Confirmatory HIDA scan could be considered as clinically appropriate. No biliary ductal dilatation. Electronically Signed   By: Ranell Bring M.D.   On: 04/14/2024 18:23   CT ABDOMEN PELVIS W CONTRAST Result Date: 04/14/2024 CLINICAL DATA:  Fever. Fatigue. Previous history of recent hospital stay for sepsis and  dehydration. Abnormal liver function tests EXAM: CT ABDOMEN AND PELVIS WITH CONTRAST TECHNIQUE: Multidetector CT imaging of the abdomen and pelvis was performed using the standard protocol following bolus administration of intravenous contrast. RADIATION DOSE REDUCTION: This exam was performed according to the departmental dose-optimization program which includes automated exposure control, adjustment of the mA and/or kV according to patient size and/or use of iterative reconstruction technique. CONTRAST:  65mL OMNIPAQUE  IOHEXOL  350 MG/ML SOLN COMPARISON:  Ultrasound abdomen 03/29/2024. Renal stone CT 03/28/2024. Recent swallowing study of 04/05/2024 FINDINGS: Lower chest: Mild opacity along the lung bases. Acute infiltrate is possible. Recommend follow-up. No pleural effusion. Breathing motion. Scattered vascular calcifications including along the coronary arteries. Hepatobiliary: Dilated gallbladder with wall thickening and wall edema. Stones are identified. Please correlate for other clinical evidence of acute cholecystitis. No space-occupying liver lesion. Patent portal vein. Pancreas: Mild global atrophy of the pancreas. No obvious enhancing mass. Spleen: Spleen is nonenlarged. Adrenals/Urinary Tract: Minimal thickening of the lateral limb of the right adrenal gland, nonspecific. Left adrenal glands preserved. Global bilateral renal atrophy. Upper pole Bosniak 1 right-sided small renal cyst. There are bilateral nonobstructing renal stones. These were seen previously. Example on the right measures 10 mm and left 8 mm. Other foci identified as well. Ureters have normal course and caliber extending down to the urinary bladder. Preserved contour to the urinary bladder. Slightly prominent prostate. Stomach/Bowel: There is contrast seen throughout the colon, likely did to previous swallowing study but please correlate with history. Large bowel is nondilated. Scattered colonic stool. Normal retrocecal appendix. The  stomach has some luminal fluid. Small bowel is nondilated. Vascular/Lymphatic: Normal caliber IVC. Diffuse vascular calcifications identified  including along the aorta and iliac vessels. 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 Slightly saccular in appearance. Second smaller area more caudal as well with what may be a non flow-limiting short segment dissection. Please correlate with history. Simple attention on follow-up. Reproductive: Heterogeneous enlarged prostate. Please correlate with the BPH changes and patient's PSA. Other: No free air or free fluid. Musculoskeletal: Scattered degenerative changes. Multilevel Schmorl's node deformities. Posterior multilevel disc bulging and posterior osteophytes. IMPRESSION: New mild areas of opacity along the lung bases, right greater left. Subtle infiltrate is possible. Recommend follow-up. Dilated gallbladder with slight wall thickening and some wall edema, new from previous. There are stones as well. Please correlate for any clinical evidence of acute cholecystitis and if needed additional workup such as HIDA scan. No bowel obstruction. Contrast seen along the course of the colon. Normal appendix. Bilateral nonobstructing renal stones. Mild ectasia of the abdominal aorta with some saccular areas. Simple follow up surveillance. Electronically Signed   By: Ranell Bring M.D.   On: 04/14/2024 17:28   DG Chest 2 View Result Date: 04/14/2024 CLINICAL DATA:  Sepsis. EXAM: CHEST - 2 VIEW COMPARISON:  X-ray 03/28/2024. FINDINGS: Underinflation. There is some linear opacity lung bases likely scar or atelectasis. No consolidation, pneumothorax or effusion. No edema. Normal cardiopericardial silhouette. Calcified aorta. Degenerative changes along the spine. There are loops of bowel in the abdomen which have some contrast. Please correlate with known history and prior fluoroscopy. IMPRESSION: Underinflation.  Basilar atelectasis.  Electronically Signed   By: Ranell Bring M.D.   On: 04/14/2024 15:25   DG Swallowing Func-Speech Pathology Result Date: 04/05/2024 Table formatting from the original result was not included. Modified Barium Swallow Study Patient Details Name: Dustin Hess MRN: [id number] Date of Birth: 1945-10-27 Today's Date: 04/05/2024 HPI/PMH: HPI: DUSTYN DANSEREAU is a 78 y.o. male who was brought to the ER after patient was found to be increasingly weak, not eating well, lethargic and confused. Dx acute metabolic encephalopathy, AKI, hypernatremia. PMHxCAD status post PCI in 2005, hypertension, hypothyroidism, blindness, anemia, BPH. Clinical Impression: Clinical Impression: Pt presents with improvements related to oropharyngeal function, but continues to exhibit moderate dysphagia primarily due to the amount of pharyngeal residue. His oral phase is much more timely and swallow initiation is consistently triggered at the pyrifrom sinuses. Epiglottic inversion is improved but is not complete. This is expected to be chronic given anterior curvature of pt's cervical spine; however, the improvement from absent to partial results in better airway protection. No penetration/aspiration occurred with any consistencies trialed today. The amount of residue with liquids appears generally unchanged since previous MBS 6/30 but has decreased with solids due to more complete epiglottic inversion with heavier boluses. Recommend upgrading to Dys 2 solids with thin liquids and ongoing dysphagia intervention with SLP at next venue of care. Discussed with pt, RN, and MD. SLP will f/u. DIGEST Swallow Severity Rating*  Safety: 0  Efficiency: 3  Overall Pharyngeal Swallow Severity: 2 (moderate) 1: mild; 2: moderate; 3: severe; 4: profound *The Dynamic Imaging Grade of Swallowing Toxicity is standardized for the head and neck cancer population, however, demonstrates promising clinical applications across populations to standardize the  clinical rating of pharyngeal swallow safety and severity. Factors that may increase risk of adverse event in presence of aspiration [name] & [name] 2021): Factors that may increase risk of adverse event in presence of aspiration [name] & [name] 2021): Poor general health and/or compromised  immunity; Frail or deconditioned Recommendations/Plan: Swallowing Evaluation Recommendations Swallowing Evaluation Recommendations Recommendations: PO diet PO Diet Recommendation: Dysphagia 2 (Finely chopped); Thin liquids (Level 0) Liquid Administration via: Cup; Straw Medication Administration: Crushed with puree Supervision: Full assist for feeding; Full supervision/cueing for swallowing strategies Swallowing strategies  : Minimize environmental distractions; Slow rate; Small bites/sips; Multiple dry swallows after each bite/sip; Follow solids with liquids Postural changes: Position pt fully upright for meals; Stay upright 30-60 min after meals Oral care recommendations: Oral care BID (2x/day) Treatment Plan Treatment Plan Treatment recommendations: Therapy as outlined in treatment plan below Follow-up recommendations: Skilled nursing-short term rehab (<3 hours/day) Functional status assessment: Patient has had a recent decline in their functional status and demonstrates the ability to make significant improvements in function in a reasonable and predictable amount of time. Treatment frequency: Min 2x/week Treatment duration: 2 weeks Interventions: Aspiration precaution training; Oropharyngeal exercises; Patient/family education; Trials of upgraded texture/liquids; Diet toleration management by SLP Recommendations Recommendations for follow up therapy are one component of a multi-disciplinary discharge planning process, led by the attending physician.  Recommendations may be updated based on patient status, additional functional criteria and insurance authorization. Assessment: Orofacial Exam: Orofacial Exam Oral Cavity:  Oral Hygiene: WFL Oral Cavity - Dentition: Poor condition; Missing dentition Orofacial Anatomy: WFL Oral Motor/Sensory Function: WFL Anatomy: Anatomy: Suspected cervical osteophytes Boluses Administered: Boluses Administered Boluses Administered: Thin liquids (Level 0); Mildly thick liquids (Level 2, nectar thick); Moderately thick liquids (Level 3, honey thick); Puree; Solid  Oral Impairment Domain: Oral Impairment Domain Lip Closure: Interlabial escape, no progression to anterior lip Tongue control during bolus hold: Cohesive bolus between tongue to palatal seal Bolus preparation/mastication: Timely and efficient chewing and mashing Bolus transport/lingual motion: Brisk tongue motion Oral residue: Trace residue lining oral structures Location of oral residue : Tongue; Palate Initiation of pharyngeal swallow : Pyriform sinuses  Pharyngeal Impairment Domain: Pharyngeal Impairment Domain Soft palate elevation: No bolus between soft palate (SP)/pharyngeal wall (PW) Laryngeal elevation: Complete superior movement of thyroid  cartilage with complete approximation of arytenoids to epiglottic petiole Anterior hyoid excursion: Complete anterior movement Epiglottic movement: Partial inversion Laryngeal vestibule closure: Complete, no air/contrast in laryngeal vestibule Pharyngeal stripping wave : Present - diminished Pharyngeal contraction (A/P view only): N/A Pharyngoesophageal segment opening: Partial distention/partial duration, partial obstruction of flow Tongue base retraction: Narrow column of contrast or air between tongue base and PPW Pharyngeal residue: Collection of residue within or on pharyngeal structures Location of pharyngeal residue: Tongue base; Valleculae; Pyriform sinuses  Esophageal Impairment Domain: No data recorded Pill: No data recorded Penetration/Aspiration Scale Score: Penetration/Aspiration Scale Score 1.  Material does not enter airway: Thin liquids (Level 0); Mildly thick liquids (Level 2,  nectar thick); Moderately thick liquids (Level 3, honey thick); Puree; Solid Compensatory Strategies: Compensatory Strategies Compensatory strategies: No   General Information: Caregiver present: No  Diet Prior to this Study: Dysphagia 1 (pureed); Mildly thick liquids (Level 2, nectar thick)   Temperature : Normal   Respiratory Status: WFL   Supplemental O2: None (Room air)   History of Recent Intubation: No  Behavior/Cognition: Alert; Cooperative Self-Feeding Abilities: Dependent for feeding Baseline vocal quality/speech: Normal Volitional Cough: Able to elicit Volitional Swallow: Able to elicit Exam Limitations: No limitations Goal Planning: Prognosis for improved oropharyngeal function: Fair Barriers to Reach Goals: Time post onset; Severity of deficits No data recorded Patient/Family Stated Goal: none stated Consulted and agree with results and recommendations: Patient Pain: Pain Assessment Pain Assessment: No/denies pain End of Session: Start Time:SLP  Start Time (ACUTE ONLY): 1224 Stop Time: SLP Stop Time (ACUTE ONLY): 1239 Time Calculation:SLP Time Calculation (min) (ACUTE ONLY): 15 min Charges: SLP Evaluations $ SLP Speech Visit: 1 Visit SLP Evaluations $MBS Swallow: 1 Procedure $Swallowing Treatment: 1 Procedure SLP visit diagnosis: SLP Visit Diagnosis: Dysphagia, oropharyngeal phase (R13.12) Past Medical History: Past Medical History: Diagnosis Date  Anemia, unspecified 12/22/2013  Arthritis   Benign prostatic hypertrophy   Congenital blindness   Coronary artery disease   a. 06/2004 Inf STEMI with PCI/BMS to RCA/LCX;  b. Cath 2014 40-50% focal bradycardia circ stenosis with a patent stent, RCA 50% stenosis, distal 60% stenosis in a previously stented area. 06/2011 Neg MV, EF 67%.  Dyslipidemia   GERD (gastroesophageal reflux disease)   GI bleeding 2006  Hypertension   Hypothyroidism   Leukopenia 12/22/2013 Past Surgical History: Past Surgical History: Procedure Laterality Date  COLONOSCOPY    CORONARY  ANGIOPLASTY WITH STENT PLACEMENT    ESOPHAGOGASTRODUODENOSCOPY    HEMORRHOID SURGERY    LEFT HEART CATHETERIZATION WITH CORONARY ANGIOGRAM N/A 04/30/2013  Procedure: LEFT HEART CATHETERIZATION WITH CORONARY ANGIOGRAM;  Surgeon: Ozell Fell, MD;  Location: [facility] CATH LAB;  Service: Cardiovascular;  Laterality: N/A; Damien Blumenthal, M.A., CCC-SLP Speech Language Pathology, Acute Rehabilitation Services Secure Chat preferred [phone] 04/05/2024, 12:59 PM  MR BRAIN WO CONTRAST Result Date: 03/30/2024 CLINICAL DATA:  Mental status change. EXAM: MRI HEAD WITHOUT CONTRAST TECHNIQUE: Multiplanar, multiecho pulse sequences of the brain and surrounding structures were obtained without intravenous contrast. COMPARISON:  CT head 03/28/2024. FINDINGS: Brain: No acute infarct. No evidence of intracranial hemorrhage. T2/FLAIR hyperintensity in the periventricular and subcortical white matter. Generalized parenchymal volume loss. There is mild prominence of extra-axial spaces over the cerebral convexities without evidence of extra-axial fluid collection. No edema, mass effect, or midline shift. Posterior fossa is unremarkable. Normal appearance of midline structures. The basilar cisterns are patent. Ventricles: Normal size and configuration of the ventricles. Vascular: Skull base flow voids are visualized. Skull and upper cervical spine: No focal abnormality. Sinuses/Orbits: Ocular prostheses. Mucous retention cyst in the right maxillary sinus. Other: Mastoid air cells are clear. IMPRESSION: No acute intracranial abnormality. Mild chronic microvascular ischemic changes and mild parenchymal volume loss. Electronically Signed   By: Donnice Mania M.D.   On: 03/30/2024 17:10   DG Swallowing Func-Speech Pathology Result Date: 03/29/2024 Table formatting from the original result was not included. Modified Barium Swallow Study Patient Details Name: CHARLIS HARNER MRN: [id number] Date of Birth: 1946/08/31 Today's Date: 03/29/2024  HPI/PMH: HPI: Dustin Hess is a 78 y.o. male with history of CAD status post PCI in 2005, hypertension, hypothyroidism, blindness, anemia, BPH was brought to the ER after patient was found to be increasingly weak, not eating well, lethargic with some confusion for the last 2 to 3 days.  History was provided by patient's wife.  Per wife patient did not have any nausea vomiting or diarrhea did not complain of any chest pain or abdominal pain.  Was refusing to eat because of poor appetite and became more weak, tired, and lethargic. Clinical Impression: Clinical Impression: Pt exhibits moderate oropharyngeal dysphagia. This appears to primarily be a result of posture as his neck remains in a hyperextended position with limited mobility, which greatly affected his ability to try compensatory strategies. Due to the anterior curvature of his cervical spine, base of tongue retraction and epiglottic inversion are nearly absent. This results in retention of the majority of each bolus along the base of tongue, valleculae, and posterior  pharyngeal wall. With consecutive sips of thin liquids, the bolus reaches his vocal folds without sensation (PAS 5). When cued to cough, it was expelled. Nectar thick liquids allow slightly improved epiglottic deflection and thus, better airway protection. Even when given via consecutive straw sips, nectar thick liquids are transiently penetrated (PAS 2, considered WFL). While purees do not enter the laryngeal vestibule, there is minimal clearance from the valleculae which is only slightly improved with a liquid wash and subswallows. Overall, there is minimal pharyngeal space for each bolus to flow through the pharynx and PES. Brief oral holding with tremulous swallow initiation was also observed. Recommend full nectar thick liquid diet with consideration of meds given via alternative means. He will require assistance with feeding as well as cueing to swallow multiple times and use a  liquid wash. SLP will f/u. DIGEST Swallow Severity Rating*  Safety:  1  Efficiency: 3  Overall Pharyngeal Swallow Severity: 2 (moderate) 1: mild; 2: moderate; 3: severe; 4: profound *The Dynamic Imaging Grade of Swallowing Toxicity is standardized for the head and neck cancer population, however, demonstrates promising clinical applications across populations to standardize the clinical rating of pharyngeal swallow safety and severity.  Factors that may increase risk of adverse event in presence of aspiration [name] & [name] 2021): Factors that may increase risk of adverse event in presence of aspiration [name] & [name] 2021): Poor general health and/or compromised immunity; Reduced cognitive function; Limited mobility; Frail or deconditioned; Dependence for feeding and/or oral hygiene Recommendations/Plan: Swallowing Evaluation Recommendations Swallowing Evaluation Recommendations Recommendations: PO diet PO Diet Recommendation: Mildly thick liquids (Level 2, nectar thick); Full liquid diet Liquid Administration via: Spoon; Cup; Straw Medication Administration: Via alternative means Supervision: Full assist for feeding; Full supervision/cueing for swallowing strategies Swallowing strategies  : Minimize environmental distractions; Slow rate; Small bites/sips; Multiple dry swallows after each bite/sip; Follow solids with liquids Postural changes: Position pt fully upright for meals; Stay upright 30-60 min after meals Oral care recommendations: Oral care BID (2x/day) Caregiver Recommendations: Avoid jello, ice cream, thin soups, popsicles; Remove water pitcher Treatment Plan Treatment Plan Treatment recommendations: Therapy as outlined in treatment plan below Follow-up recommendations: Home health SLP Functional status assessment: Patient has had a recent decline in their functional status and demonstrates the ability to make significant improvements in function in a reasonable and predictable amount of time.  Treatment frequency: Min 2x/week Treatment duration: 2 weeks Interventions: Aspiration precaution training; Oropharyngeal exercises; Patient/family education; Trials of upgraded texture/liquids; Diet toleration management by SLP Recommendations Recommendations for follow up therapy are one component of a multi-disciplinary discharge planning process, led by the attending physician.  Recommendations may be updated based on patient status, additional functional criteria and insurance authorization. Assessment: Orofacial Exam: Orofacial Exam Oral Cavity: Oral Hygiene: WFL Oral Cavity - Dentition: Poor condition; Missing dentition Orofacial Anatomy: WFL Oral Motor/Sensory Function: WFL Anatomy: Anatomy: Suspected cervical osteophytes Boluses Administered: Boluses Administered Boluses Administered: Thin liquids (Level 0); Mildly thick liquids (Level 2, nectar thick); Puree  Oral Impairment Domain: Oral Impairment Domain Lip Closure: Escape from interlabial space or lateral juncture, no extension beyond vermillion border Tongue control during bolus hold: Posterior escape of greater than half of bolus Bolus preparation/mastication: Slow prolonged chewing/mashing with complete recollection Bolus transport/lingual motion: Delayed initiation of tongue motion (oral holding) Oral residue: Residue collection on oral structures Location of oral residue : Tongue; Palate Initiation of pharyngeal swallow : Pyriform sinuses  Pharyngeal Impairment Domain: Pharyngeal Impairment Domain Soft palate elevation: No bolus between soft  palate (SP)/pharyngeal wall (PW) Laryngeal elevation: Complete superior movement of thyroid  cartilage with complete approximation of arytenoids to epiglottic petiole Anterior hyoid excursion: Complete anterior movement Epiglottic movement: No inversion Laryngeal vestibule closure: Incomplete, narrow column air/contrast in laryngeal vestibule Pharyngeal stripping wave : Present - diminished Pharyngeal  contraction (A/P view only): N/A Pharyngoesophageal segment opening: Minimal distention/minimal duration, marked obstruction of flow Tongue base retraction: Wide column of contrast or air between tongue base and PPW Pharyngeal residue: Majority of contrast within or on pharyngeal structures Location of pharyngeal residue: Tongue base; Valleculae; Pharyngeal wall  Esophageal Impairment Domain: No data recorded Pill: No data recorded Penetration/Aspiration Scale Score: Penetration/Aspiration Scale Score 1.  Material does not enter airway: Puree 2.  Material enters airway, remains ABOVE vocal cords then ejected out: Mildly thick liquids (Level 2, nectar thick) 5.  Material enters airway, CONTACTS cords and not ejected out: Thin liquids (Level 0) Compensatory Strategies: Compensatory Strategies Compensatory strategies: No   General Information: Caregiver present: No  Diet Prior to this Study: NPO   Temperature : Normal   Respiratory Status: WFL   Supplemental O2: Nasal cannula   History of Recent Intubation: No  Behavior/Cognition: Requires cueing; Lethargic/Drowsy; Doesn't follow directions Self-Feeding Abilities: Dependent for feeding Baseline vocal quality/speech: Normal Volitional Cough: Able to elicit Volitional Swallow: Able to elicit Exam Limitations: No limitations Goal Planning: Prognosis for improved oropharyngeal function: Fair Barriers to Reach Goals: Cognitive deficits; Time post onset; Severity of deficits No data recorded Patient/Family Stated Goal: none stated Consulted and agree with results and recommendations: Pt unable/family or caregiver not available Pain: Pain Assessment Pain Assessment: Faces Faces Pain Scale: 0 Pain Intervention(s): Limited activity within patient's tolerance End of Session: Start Time:SLP Start Time (ACUTE ONLY): 1453 Stop Time: SLP Stop Time (ACUTE ONLY): 1517 Time Calculation:SLP Time Calculation (min) (ACUTE ONLY): 24 min Charges: SLP Evaluations $ SLP Speech Visit: 1 Visit  SLP Evaluations $BSS Swallow: 1 Procedure $MBS Swallow: 1 Procedure SLP visit diagnosis: SLP Visit Diagnosis: Dysphagia, oropharyngeal phase (R13.12) Past Medical History: Past Medical History: Diagnosis Date  Anemia, unspecified 12/22/2013  Arthritis   Benign prostatic hypertrophy   Congenital blindness   Coronary artery disease   a. 06/2004 Inf STEMI with PCI/BMS to RCA/LCX;  b. Cath 2014 40-50% focal bradycardia circ stenosis with a patent stent, RCA 50% stenosis, distal 60% stenosis in a previously stented area. 06/2011 Neg MV, EF 67%.  Dyslipidemia   GERD (gastroesophageal reflux disease)   GI bleeding 2006  Hypertension   Hypothyroidism   Leukopenia 12/22/2013 Past Surgical History: Past Surgical History: Procedure Laterality Date  COLONOSCOPY    CORONARY ANGIOPLASTY WITH STENT PLACEMENT    ESOPHAGOGASTRODUODENOSCOPY    HEMORRHOID SURGERY    LEFT HEART CATHETERIZATION WITH CORONARY ANGIOGRAM N/A 04/30/2013  Procedure: LEFT HEART CATHETERIZATION WITH CORONARY ANGIOGRAM;  Surgeon: Ozell Fell, MD;  Location: [facility] CATH LAB;  Service: Cardiovascular;  Laterality: N/A; Damien Blumenthal, M.A., CCC-SLP Speech Language Pathology, Acute Rehabilitation Services Secure Chat preferred [phone] 03/29/2024, 4:39 PM  US  Abdomen Limited RUQ (LIVER/GB) Result Date: 03/29/2024 CLINICAL DATA:  78 year old male with acute renal failure. Nausea vomiting. EXAM: ULTRASOUND ABDOMEN LIMITED RIGHT UPPER QUADRANT COMPARISON:  Noncontrast CT Abdomen and Pelvis yesterday. FINDINGS: Gallbladder: Layering gravel type stones as seen by CT yesterday. Superimposed dependent sludge within the gallbladder (image 3). Gallbladder wall thickness remains within normal limits. No pericholecystic fluid. No sonographic Murphy sign described. Common bile duct: Diameter: 6-7 mm, upper limits of normal. No filling defect within the visible  duct. Liver: No intrahepatic biliary ductal dilatation identified. Liver echogenicity within normal limits. No discrete  liver lesion. Portal vein is patent on color Doppler imaging with normal direction of blood flow towards the liver. Other: Echogenic right kidney (image 44) compatible with medical renal disease. No free fluid identified. IMPRESSION: 1. Cholelithiasis and gallbladder sludge but no strong imaging evidence of acute cholecystitis. 2. Common bile duct diameter at the upper limits of normal. No intrahepatic ductal dilatation to strongly suggest choledocholithiasis. 3. Echogenic right kidney compatible with medical renal disease. Electronically Signed   By: VEAR Hurst M.D.   On: 03/29/2024 07:24   CT RENAL STONE STUDY Result Date: 03/28/2024 CLINICAL DATA:  Acute renal failure EXAM: CT ABDOMEN AND PELVIS WITHOUT CONTRAST TECHNIQUE: Multidetector CT imaging of the abdomen and pelvis was performed following the standard protocol without IV contrast. RADIATION DOSE REDUCTION: This exam was performed according to the departmental dose-optimization program which includes automated exposure control, adjustment of the mA and/or kV according to patient size and/or use of iterative reconstruction technique. COMPARISON:  Remote CT 09/18/2005 FINDINGS: Lower chest: Normal heart size with coronary artery calcifications. Subsegmental right lower lobe atelectasis. Hepatobiliary: Motion artifact limitations. No evidence of focal liver abnormality on this unenhanced exam. Small gallstones layering in the gallbladder. No pericholecystic inflammation. No biliary dilatation. Pancreas: No ductal dilatation or inflammation. Spleen: Normal in size without focal abnormality. Adrenals/Urinary Tract: No adrenal nodule. No hydronephrosis. There are bilateral intrarenal calculi. No perinephric inflammation. No evidence of focal renal abnormality. Decompressed ureters. Partially distended urinary bladder, no bladder wall thickening. Stomach/Bowel: Decompressed stomach. No small bowel obstruction or inflammation. Normal appendix. Moderate volume of  stool in the colon. Mild left colonic diverticulosis. No diverticulitis. No colonic inflammation. Vascular/Lymphatic: Advanced aortic and branch atherosclerosis. No aortic aneurysm. No adenopathy. Reproductive: Prostate is unremarkable. Other: No free air or ascites.  No abdominal wall hernia. Musculoskeletal: There are no acute or suspicious osseous abnormalities. Schmorl's nodes throughout the lumbar spine IMPRESSION: 1. No acute abnormality in the abdomen/pelvis. 2. Bilateral nonobstructing intrarenal calculi. 3. Cholelithiasis without cholecystitis. 4. Mild left colonic diverticulosis without diverticulitis. Aortic Atherosclerosis (ICD10-I70.0). Electronically Signed   By: Andrea Gasman M.D.   On: 03/28/2024 23:07   CT Head Wo Contrast Result Date: 03/28/2024 CLINICAL DATA:  Altered mental status. EXAM: CT HEAD WITHOUT CONTRAST TECHNIQUE: Contiguous axial images were obtained from the base of the skull through the vertex without intravenous contrast. RADIATION DOSE REDUCTION: This exam was performed according to the departmental dose-optimization program which includes automated exposure control, adjustment of the mA and/or kV according to patient size and/or use of iterative reconstruction technique. COMPARISON:  August 28, 2023 FINDINGS: Brain: There is generalized cerebral atrophy with widening of the extra-axial spaces and ventricular dilatation. There are areas of decreased attenuation within the white matter tracts of the supratentorial brain, consistent with microvascular disease changes. Vascular: No hyperdense vessel or unexpected calcification. Skull: Normal. Negative for fracture or focal lesion. Sinuses/Orbits: A 1.9 cm right maxillary sinus polyp versus mucous retention cyst is seen. Postoperative changes are seen involving both globes. Other: None. IMPRESSION: 1. Generalized cerebral atrophy with chronic white matter small vessel ischemic changes. 2. No acute intracranial abnormality. 3.  Right maxillary sinus polyp versus mucous retention cyst. Electronically Signed   By: Suzen Dials M.D.   On: 03/28/2024 17:49   DG Chest Portable 1 View Result Date: 03/28/2024 CLINICAL DATA:  Altered mental status, chills, fatigue and cough. EXAM: PORTABLE CHEST 1 VIEW COMPARISON:  August 28, 2023 FINDINGS: Limited study secondary to patient rotation. The heart size and mediastinal contours are within normal limits. Both lungs are clear. The visualized skeletal structures are unremarkable. IMPRESSION: No active disease. Electronically Signed   By: Suzen Dials M.D.   On: 03/28/2024 16:19     Subjective: - no chest pain, shortness of breath, no abdominal pain, nausea or vomiting.   Discharge Exam: BP (!) 150/77 (BP Location: Left Arm)   Pulse 89   Temp 98.7 F (37.1 C) (Oral)   Resp 16   Ht 5' 3 (1.6 m)   Wt 64.4 kg   SpO2 100%   BMI 25.15 kg/m   General: Pt is alert, awake, not in acute distress Cardiovascular: RRR, S1/S2 +, no rubs, no gallops Respiratory: CTA bilaterally, no wheezing, no rhonchi Abdominal: Soft, NT, ND, bowel sounds + Extremities: no edema, no cyanosis    The results of significant diagnostics from this hospitalization (including imaging, microbiology, ancillary and laboratory) are listed below for reference.     Microbiology: Recent Results (from the past 240 hours)  Culture, blood (Routine x 2)     Status: None   Collection Time: 04/14/24  2:50 PM   Specimen: BLOOD RIGHT HAND  Result Value Ref Range Status   Specimen Description BLOOD RIGHT HAND  Final   Special Requests   Final    AEROBIC BOTTLE ONLY Blood Culture results may not be optimal due to an inadequate volume of blood received in culture bottles   Culture   Final    NO GROWTH 5 DAYS Performed at [facility], [address], [place], [place] [address]    Report Status 04/19/2024 FINAL  Final  Resp panel by RT-PCR (RSV, Flu A&B, Covid) Anterior Nasal Swab     Status:  None   Collection Time: 04/14/24  3:11 PM   Specimen: Anterior Nasal Swab  Result Value Ref Range Status   SARS Coronavirus 2 by RT PCR NEGATIVE NEGATIVE Final   Influenza A by PCR NEGATIVE NEGATIVE Final   Influenza B by PCR NEGATIVE NEGATIVE Final    Comment: (NOTE) The Xpert Xpress SARS-CoV-2/FLU/RSV plus assay is intended as an aid in the diagnosis of influenza from Nasopharyngeal swab specimens and should not be used as a sole basis for treatment. Nasal washings and aspirates are unacceptable for Xpert Xpress SARS-CoV-2/FLU/RSV testing.  Fact Sheet for Patients: BloggerCourse.com  Fact Sheet for Healthcare Providers: SeriousBroker.it  This test is not yet approved or cleared by the [place]  FDA and has been authorized for detection and/or diagnosis of SARS-CoV-2 by FDA under an Emergency Use Authorization (EUA). This EUA will remain in effect (meaning this test can be used) for the duration of the COVID-19 declaration under Section 564(b)(1) of the Act, 21 U.S.C. section 360bbb-3(b)(1), unless the authorization is terminated or revoked.     Resp Syncytial Virus by PCR NEGATIVE NEGATIVE Final    Comment: (NOTE) Fact Sheet for Patients: BloggerCourse.com  Fact Sheet for Healthcare Providers: SeriousBroker.it  This test is not yet approved or cleared by the [place]  FDA and has been authorized for detection and/or diagnosis of SARS-CoV-2 by FDA under an Emergency Use Authorization (EUA). This EUA will remain in effect (meaning this test can be used) for the duration of the COVID-19 declaration under Section 564(b)(1) of the Act, 21 U.S.C. section 360bbb-3(b)(1), unless the authorization is terminated or revoked.  Performed at [facility], [address] [place], [place] [address]  Culture, blood (Routine x 2)     Status: None (Preliminary result)    Collection Time: 04/15/24  4:15 AM   Specimen: BLOOD  Result Value Ref Range Status   Specimen Description BLOOD SITE NOT SPECIFIED  Final   Special Requests   Final    BOTTLES DRAWN AEROBIC AND ANAEROBIC Blood Culture adequate volume   Culture   Final    NO GROWTH 4 DAYS Performed at [facility], [address] [place], [place] [address]    Report Status PENDING  Incomplete     Labs: Basic Metabolic Panel: Recent Labs  Lab 04/14/24 1343 04/15/24 0257 04/16/24 0559 04/17/24 0415 04/18/24 0756  NA 143 141 143 140 140  K 3.8 3.8 3.6 3.4* 3.9  CL 109 109 107 107 106  CO2 23 24 25 25 24   GLUCOSE 109* 90 100* 98 106*  BUN 20 18 18 10 10   CREATININE 0.87 0.91 0.93 0.83 0.87  CALCIUM  9.5 9.3 9.6 9.2 9.3  MG  --  2.0 1.8 1.7 1.9  PHOS  --  3.5  --   --   --    Liver Function Tests: Recent Labs  Lab 04/14/24 1343 04/15/24 0257 04/16/24 0559 04/17/24 0415 04/18/24 0756  AST 488* 224* 108* 81* 67*  ALT 368* 319* 232* 169* 138*  ALKPHOS 155* 154* 145* 130* 123  BILITOT 3.3* 3.5* 2.3* 2.0* 1.3*  PROT 7.1 6.1* 7.1 6.7 6.6  ALBUMIN 3.6 3.1* 3.3* 3.0* 3.0*   CBC: Recent Labs  Lab 04/14/24 1343 04/15/24 0257 04/16/24 0559 04/17/24 0415 04/18/24 0756  WBC 8.6 8.1 5.2 4.9 3.7*  NEUTROABS 7.7  --   --   --   --   HGB 13.7 12.5* 13.7 13.0 12.8*  HCT 41.1 37.8* 41.5 38.6* 38.2*  MCV 95.4 94.7 94.5 93.0 93.4  PLT 159 135* 133* 129* 129*   CBG: Recent Labs  Lab 04/18/24 2105 04/19/24 0011 04/19/24 0522 04/19/24 0801 04/19/24 1140  GLUCAP 110* 130* 96 100* 93   Hgb A1c No results for input(s): HGBA1C in the last 72 hours. Lipid Profile No results for input(s): CHOL, HDL, LDLCALC, TRIG, CHOLHDL, LDLDIRECT in the last 72 hours. Thyroid  function studies No results for input(s): TSH, T4TOTAL, T3FREE, THYROIDAB in the last 72 hours.  Invalid input(s): FREET3 Urinalysis    Component Value Date/Time   COLORURINE AMBER (A) 04/15/2024  0257   APPEARANCEUR CLEAR 04/15/2024 0257   LABSPEC >1.046 (H) 04/15/2024 0257   PHURINE 5.0 04/15/2024 0257   GLUCOSEU NEGATIVE 04/15/2024 0257   HGBUR NEGATIVE 04/15/2024 0257   BILIRUBINUR SMALL (A) 04/15/2024 0257   KETONESUR NEGATIVE 04/15/2024 0257   PROTEINUR 100 (A) 04/15/2024 0257   UROBILINOGEN 1.0 12/28/2007 0801   NITRITE NEGATIVE 04/15/2024 0257   LEUKOCYTESUR NEGATIVE 04/15/2024 0257    FURTHER DISCHARGE INSTRUCTIONS:   Get Medicines reviewed and adjusted: Please take all your medications with you for your next visit with your Primary MD   Laboratory/radiological data: Please request your Primary MD to go over all hospital tests and procedure/radiological results at the follow up, please ask your Primary MD to get all Hospital records sent to his/her office.   In some cases, they will be blood work, cultures and biopsy results pending at the time of your discharge. Please request that your primary care M.D. goes through all the records of your hospital data and follows up on these results.   Also Note the following: If you experience  worsening of your admission symptoms, develop shortness of breath, life threatening emergency, suicidal or homicidal thoughts you must seek medical attention immediately by calling 911 or calling your MD immediately  if symptoms less severe.   You must read complete instructions/literature along with all the possible adverse reactions/side effects for all the Medicines you take and that have been prescribed to you. Take any new Medicines after you have completely understood and accpet all the possible adverse reactions/side effects.    Do not drive when taking Pain medications or sleeping medications (Benzodaizepines)   Do not take more than prescribed Pain, Sleep and Anxiety Medications. It is not advisable to combine anxiety,sleep and pain medications without talking with your primary care practitioner   Special Instructions: If you have  smoked or chewed Tobacco  in the last 2 yrs please stop smoking, stop any regular Alcohol  and or any Recreational drug use.   Wear Seat belts while driving.   Please note: You were cared for by a hospitalist during your hospital stay. Once you are discharged, your primary care physician will handle any further medical issues. Please note that NO REFILLS for any discharge medications will be authorized once you are discharged, as it is imperative that you return to your primary care physician (or establish a relationship with a primary care physician if you do not have one) for your post hospital discharge needs so that they can reassess your need for medications and monitor your lab values.  Time coordinating discharge: 35 minutes  SIGNED:  Nilda Fendt, MD, PhD 04/19/2024, 12:39 PM

## 2024-04-20 DIAGNOSIS — K819 Cholecystitis, unspecified: Secondary | ICD-10-CM | POA: Diagnosis not present

## 2024-04-20 LAB — GLUCOSE, CAPILLARY
Glucose-Capillary: 126 mg/dL — ABNORMAL HIGH (ref 70–99)
Glucose-Capillary: 143 mg/dL — ABNORMAL HIGH (ref 70–99)
Glucose-Capillary: 85 mg/dL (ref 70–99)
Glucose-Capillary: 88 mg/dL (ref 70–99)
Glucose-Capillary: 89 mg/dL (ref 70–99)
Glucose-Capillary: 96 mg/dL (ref 70–99)

## 2024-04-20 LAB — CULTURE, BLOOD (ROUTINE X 2)
Culture: NO GROWTH
Special Requests: ADEQUATE

## 2024-04-20 NOTE — Plan of Care (Signed)
  Problem: Fluid Volume: Goal: Ability to maintain a balanced intake and output will improve Outcome: Progressing   Problem: Nutritional: Goal: Maintenance of adequate nutrition will improve Outcome: Progressing   Problem: Respiratory: Goal: Ability to maintain adequate ventilation will improve Outcome: Progressing

## 2024-04-20 NOTE — Plan of Care (Signed)

## 2024-04-20 NOTE — Progress Notes (Signed)
 Scheduled Hydralazine  was held due to soft BP of 100/64. MD notified. No new orders at this time.

## 2024-04-20 NOTE — Progress Notes (Signed)
 PROGRESS NOTE  Dustin Hess FMW:[id number] DOB: 1946-08-29 DOA: 04/14/2024 PCP: [name], MD   LOS: 6 days   Brief Narrative / Interim history: 78 year old male with history of CAD with PCI in 2005, HTN, hypothyroidism, blindness who comes in from [facility] with increased fatigue, low-grade fever.  Patient had abdominal pain the day prior without nausea or vomiting.  There is no reported cough or chest congestion.  CT scan of the abdomen and pelvis on admission showed possible pulmonary infiltrates as well as dilated gallbladder with some wall edema.  Surgery was consulted, he was placed on antibiotics and admitted to the hospital  Subjective / 24h Interval events: Feeling well, no abdominal pain, no nausea or vomiting.  Assesement and Plan: Principal Problem:   Cholecystitis Active Problems:   Hypothyroidism   Essential hypertension, benign   Benign prostatic hyperplasia   CAD (coronary artery disease) of artery bypass graft   Dyslipidemia   Acute cholecystitis   CAP (community acquired pneumonia)  Principal problem Principal problem Cholelithiasis, concern for cholecystitis -no significant abdominal pain, gallbladder ultrasound showed stones and sludge.  His LFTs are elevated, and bilirubin is up too, I wonder whether he has been passing some stones.  Surgery following, underwent an MRI/MRCP which showed again raise concern for cholecystitis and there were no signs of choledocholithiasis.  Discussed with general surgery team, they recommend patient be treated conservatively with antibiotics. He is now eating well, LFTs improving, stable to discharge with outpatient follow up [employer] authorization is still pending   Active problems Possible pneumonia -he does not have any cough or respiratory symptoms. He is receiving antibiotics for #1 CAD-no chest pain, continue metoprolol , Imdur  Hypothyroidism-continue Synthroid  BPH-continue  tamsulosin   Disposition-awaiting PT eval before returning to [facility], discussed with TOC, awaiting insurance auth  Scheduled Meds:  hydrALAZINE   10 mg Oral Q6H   isosorbide  mononitrate  30 mg Oral Daily   levothyroxine   88 mcg Oral Q0600   metoprolol  succinate  25 mg Oral Daily   polyethylene glycol  17 g Oral BID   tamsulosin   0.8 mg Oral Daily   Continuous Infusions:  piperacillin -tazobactam (ZOSYN )  IV 3.375 g (04/20/24 1344)   PRN Meds:.acetaminophen  **OR** acetaminophen , bisacodyl , fentaNYL  (SUBLIMAZE ) injection, HYDROcodone -acetaminophen , ondansetron  **OR** ondansetron  (ZOFRAN ) IV  Current Outpatient Medications  Medication Instructions   acetaminophen  (TYLENOL ) 650 mg, Oral, Every 8 hours PRN   amoxicillin -clavulanate (AUGMENTIN ) 875-125 MG tablet 1 tablet, Oral, 2 times daily   aspirin  81 mg, Daily   atorvastatin  (LIPITOR) 20 mg, Daily   Cholecalciferol (VITAMIN D3) 1000 UNITS CAPS 1 capsule, Daily   dutasteride  (AVODART ) 0.5 mg, Daily   ferrous sulfate  325 mg, Oral, Daily with breakfast   isosorbide  mononitrate (IMDUR ) 30 mg, Oral, Daily   levothyroxine  (SYNTHROID ) 88 mcg, Daily before breakfast   metoprolol  succinate (TOPROL -XL) 25 mg, Oral, Daily, TAKE WITH OR IMMEDIATELY FOLLOWING A MEAL.   Multiple Vitamin (MULTIVITAMIN) tablet 1 tablet, Daily   nitroGLYCERIN  (NITROSTAT ) 0.4 mg, Sublingual, Every 5 min PRN, Must call and schedule appt for future refills   senna-docusate (SENOKOT-S) 8.6-50 MG tablet 1 tablet, Oral, Daily   tamsulosin  (FLOMAX ) 0.8 mg, Daily    Diet Orders (From admission, onward)     Start     Ordered   04/19/24 0845  DIET DYS 2 Fluid consistency: Thin  Diet effective now       Question:  Fluid consistency:  Answer:  Thin   04/19/24 0845  DVT prophylaxis: SCDs Start: 04/14/24 2129   Lab Results  Component Value Date   PLT 129 (L) 04/18/2024      Code Status: Full Code  Family Communication: no family at bedside,  updated spouse over the phone  Status is: Inpatient Remains inpatient appropriate because: severity of illness  Level of care: Telemetry Medical  Consultants:  General surgery   Objective: Vitals:   04/19/24 2004 04/20/24 0443 04/20/24 0836 04/20/24 1228  BP: (!) 169/93 (!) 149/75 (!) 162/77 100/64  Pulse: 80 (!) 44 (!) 44 84  Resp: 18 18 18    Temp: (!) 97.4 F (36.3 C) 97.8 F (36.6 C) 98.6 F (37 C)   TempSrc: Oral Oral Oral   SpO2: 100% 100% 99% 99%  Weight:      Height:        Intake/Output Summary (Last 24 hours) at 04/20/2024 1514 Last data filed at 04/20/2024 1200 Gross per 24 hour  Intake 965.88 ml  Output 1600 ml  Net -634.12 ml   Wt Readings from Last 3 Encounters:  04/14/24 64.4 kg  03/28/24 64.4 kg  02/24/24 64.4 kg    Examination:  Constitutional: NAD Eyes: lids and conjunctivae normal, no scleral icterus ENMT: mmm Neck: normal, supple Respiratory: clear to auscultation bilaterally, no wheezing, no crackles. Normal respiratory effort.  Cardiovascular: Regular rate and rhythm, no murmurs / rubs / gallops. No LE edema. Abdomen: soft, no distention, no tenderness. Bowel sounds positive.   Data Reviewed: I have independently reviewed following labs and imaging studies   CBC Recent Labs  Lab 04/14/24 1343 04/15/24 0257 04/16/24 0559 04/17/24 0415 04/18/24 0756  WBC 8.6 8.1 5.2 4.9 3.7*  HGB 13.7 12.5* 13.7 13.0 12.8*  HCT 41.1 37.8* 41.5 38.6* 38.2*  PLT 159 135* 133* 129* 129*  MCV 95.4 94.7 94.5 93.0 93.4  MCH 31.8 31.3 31.2 31.3 31.3  MCHC 33.3 33.1 33.0 33.7 33.5  RDW 11.6 11.8 11.7 11.5 11.6  LYMPHSABS 0.2*  --   --   --   --   MONOABS 0.6  --   --   --   --   EOSABS 0.0  --   --   --   --   BASOSABS 0.0  --   --   --   --     Recent Labs  Lab 04/14/24 1343 04/14/24 1440 04/14/24 1537 04/15/24 0257 04/16/24 0559 04/17/24 0415 04/18/24 0756  NA 143  --   --  141 143 140 140  K 3.8  --   --  3.8 3.6 3.4* 3.9  CL 109  --   --   109 107 107 106  CO2 23  --   --  24 25 25 24   GLUCOSE 109*  --   --  90 100* 98 106*  BUN 20  --   --  18 18 10 10   CREATININE 0.87  --   --  0.91 0.93 0.83 0.87  CALCIUM  9.5  --   --  9.3 9.6 9.2 9.3  AST 488*  --   --  224* 108* 81* 67*  ALT 368*  --   --  319* 232* 169* 138*  ALKPHOS 155*  --   --  154* 145* 130* 123  BILITOT 3.3*  --   --  3.5* 2.3* 2.0* 1.3*  ALBUMIN 3.6  --   --  3.1* 3.3* 3.0* 3.0*  MG  --   --   --  2.0 1.8  1.7 1.9  PROCALCITON  --   --   --  0.13  --   --   --   LATICACIDVEN  --  0.9 0.7  --   --   --   --   INR 1.3*  --   --   --   --   --   --   TSH  --   --   --  2.168  --   --   --   HGBA1C  --   --   --  4.6*  --   --   --     ------------------------------------------------------------------------------------------------------------------ No results for input(s): CHOL, HDL, LDLCALC, TRIG, CHOLHDL, LDLDIRECT in the last 72 hours.  Lab Results  Component Value Date   HGBA1C 4.6 (L) 04/15/2024   ------------------------------------------------------------------------------------------------------------------ No results for input(s): TSH, T4TOTAL, T3FREE, THYROIDAB in the last 72 hours.  Invalid input(s): FREET3   Cardiac Enzymes No results for input(s): CKMB, TROPONINI, MYOGLOBIN in the last 168 hours.  Invalid input(s): CK ------------------------------------------------------------------------------------------------------------------    Component Value Date/Time   BNP 297.9 (H) 08/28/2023 1744    CBG: Recent Labs  Lab 04/19/24 2005 04/19/24 2359 04/20/24 0444 04/20/24 0802 04/20/24 1217  GLUCAP 120* 96 85 88 89    Recent Results (from the past 240 hours)  Culture, blood (Routine x 2)     Status: None   Collection Time: 04/14/24  2:50 PM   Specimen: BLOOD RIGHT HAND  Result Value Ref Range Status   Specimen Description BLOOD RIGHT HAND  Final   Special Requests   Final    AEROBIC BOTTLE ONLY  Blood Culture results may not be optimal due to an inadequate volume of blood received in culture bottles   Culture   Final    NO GROWTH 5 DAYS Performed at [facility], [address], [place], [place] [address]    Report Status 04/19/2024 FINAL  Final  Resp panel by RT-PCR (RSV, Flu A&B, Covid) Anterior Nasal Swab     Status: None   Collection Time: 04/14/24  3:11 PM   Specimen: Anterior Nasal Swab  Result Value Ref Range Status   SARS Coronavirus 2 by RT PCR NEGATIVE NEGATIVE Final   Influenza A by PCR NEGATIVE NEGATIVE Final   Influenza B by PCR NEGATIVE NEGATIVE Final    Comment: (NOTE) The Xpert Xpress SARS-CoV-2/FLU/RSV plus assay is intended as an aid in the diagnosis of influenza from Nasopharyngeal swab specimens and should not be used as a sole basis for treatment. Nasal washings and aspirates are unacceptable for Xpert Xpress SARS-CoV-2/FLU/RSV testing.  Fact Sheet for Patients: BloggerCourse.com  Fact Sheet for Healthcare Providers: SeriousBroker.it  This test is not yet approved or cleared by the [place]  FDA and has been authorized for detection and/or diagnosis of SARS-CoV-2 by FDA under an Emergency Use Authorization (EUA). This EUA will remain in effect (meaning this test can be used) for the duration of the COVID-19 declaration under Section 564(b)(1) of the Act, 21 U.S.C. section 360bbb-3(b)(1), unless the authorization is terminated or revoked.     Resp Syncytial Virus by PCR NEGATIVE NEGATIVE Final    Comment: (NOTE) Fact Sheet for Patients: BloggerCourse.com  Fact Sheet for Healthcare Providers: SeriousBroker.it  This test is not yet approved or cleared by the [place]  FDA and has been authorized for detection and/or diagnosis of SARS-CoV-2 by FDA under an Emergency Use Authorization (EUA). This EUA will remain in effect (meaning this  test  can be used) for the duration of the COVID-19 declaration under Section 564(b)(1) of the Act, 21 U.S.C. section 360bbb-3(b)(1), unless the authorization is terminated or revoked.  Performed at [facility], [address] [place], [place] [address]   Culture, blood (Routine x 2)     Status: None   Collection Time: 04/15/24  4:15 AM   Specimen: BLOOD  Result Value Ref Range Status   Specimen Description BLOOD SITE NOT SPECIFIED  Final   Special Requests   Final    BOTTLES DRAWN AEROBIC AND ANAEROBIC Blood Culture adequate volume   Culture   Final    NO GROWTH 5 DAYS Performed at [facility], [address], [place], [place] [address]    Report Status 04/20/2024 FINAL  Final     Radiology Studies: No results found.  Nilda Fendt, MD, PhD [facility]  Between 7 am - 7 pm I am available, please contact me via Amion (for emergencies) or Securechat (non urgent messages)  Between 7 pm - 7 am I am not available, please contact night coverage MD/APP via Amion

## 2024-04-20 NOTE — Progress Notes (Signed)
 Speech Language Pathology Treatment: Dysphagia  Patient Details Name: Dustin Hess MRN: [id number] DOB: 1946-03-22 Today's Date: 04/20/2024 Time: 8577-8551 SLP Time Calculation (min) (ACUTE ONLY): 26 min  Assessment / Plan / Recommendation Clinical Impression  Patient seen by SLP for skilled treatment focused on dysphagia goals. He was awake, alert, and receptive to having lunch meal. SLP provided feeding assistance to patient and he consumed approximately 30% of food on tray. He exhibited mildly prolonged mastication with minced solids but no significant amount of PO residuals s/p swallows. No overt s/s aspiration and fairly timely swallow initiation with liquids and solids. Based on previous SLP notes and patient's current presentation with mildly prolonged mastication even with minced solids, recommend he continue with Dys 2(minced) solids, thin liquids. SLP to s/o at this time but recommend f/u at next venue of care to determine ability to advance with solid texture PO's.   HPI HPI: 78 y.o. male was brought to the ER after patient was found with fever and weakness with concerns for gallbladder wall thickening and edema. Previously (03/28/24), presented to ER for weakness, not eating well, lethargic with some confusion for the last 2 to 3 days, dx with acute metabolic encephalopathy. PMH history of CAD status post PCI in 2005, hypertension, hypothyroidism, blindness, anemia, BPH .      SLP Plan  Discharge SLP treatment due to (comment);All goals met          Recommendations  Diet recommendations: Dysphagia 2 (fine chop);Thin liquid Liquids provided via: Straw;Cup Medication Administration: Whole meds with liquid Supervision: Full supervision/cueing for compensatory strategies Compensations: Slow rate;Small sips/bites;Follow solids with liquid;Multiple dry swallows after each bite/sip Postural Changes and/or Swallow Maneuvers: Seated upright 90 degrees;Upright 30-60 min after  meal                  Oral care BID   Frequent or constant Supervision/Assistance Dysphagia, oropharyngeal phase (R13.12)     Discharge SLP treatment due to (comment);All goals met    Norleen IVAR Blase, MA, CCC-SLP Speech Therapy

## 2024-04-21 DIAGNOSIS — K819 Cholecystitis, unspecified: Secondary | ICD-10-CM | POA: Diagnosis not present

## 2024-04-21 LAB — COMPREHENSIVE METABOLIC PANEL WITH GFR
ALT: 92 U/L — ABNORMAL HIGH (ref 0–44)
AST: 41 U/L (ref 15–41)
Albumin: 3.2 g/dL — ABNORMAL LOW (ref 3.5–5.0)
Alkaline Phosphatase: 107 U/L (ref 38–126)
Anion gap: 10 (ref 5–15)
BUN: 12 mg/dL (ref 8–23)
CO2: 26 mmol/L (ref 22–32)
Calcium: 9.7 mg/dL (ref 8.9–10.3)
Chloride: 104 mmol/L (ref 98–111)
Creatinine, Ser: 1.08 mg/dL (ref 0.61–1.24)
GFR, Estimated: >60 mL/min (ref >60)
Glucose, Bld: 96 mg/dL (ref 70–99)
Potassium: 4.2 mmol/L (ref 3.5–5.1)
Sodium: 140 mmol/L (ref 135–145)
Total Bilirubin: 1.1 mg/dL (ref 0.0–1.2)
Total Protein: 6.8 g/dL (ref 6.5–8.1)

## 2024-04-21 LAB — CBC
HCT: 42.4 % (ref 39.0–52.0)
Hemoglobin: 13.9 g/dL (ref 13.0–17.0)
MCH: 30.9 pg (ref 26.0–34.0)
MCHC: 32.8 g/dL (ref 30.0–36.0)
MCV: 94.2 fL (ref 80.0–100.0)
Platelets: 122 K/uL — ABNORMAL LOW (ref 150–400)
RBC: 4.5 MIL/uL (ref 4.22–5.81)
RDW: 11.8 % (ref 11.5–15.5)
WBC: 4.6 K/uL (ref 4.0–10.5)
nRBC: 0 % (ref 0.0–0.2)

## 2024-04-21 LAB — GLUCOSE, CAPILLARY
Glucose-Capillary: 108 mg/dL — ABNORMAL HIGH (ref 70–99)
Glucose-Capillary: 118 mg/dL — ABNORMAL HIGH (ref 70–99)
Glucose-Capillary: 136 mg/dL — ABNORMAL HIGH (ref 70–99)
Glucose-Capillary: 151 mg/dL — ABNORMAL HIGH (ref 70–99)
Glucose-Capillary: 65 mg/dL — ABNORMAL LOW (ref 70–99)
Glucose-Capillary: 94 mg/dL (ref 70–99)
Glucose-Capillary: 98 mg/dL (ref 70–99)

## 2024-04-21 LAB — MAGNESIUM: Magnesium: 2.2 mg/dL (ref 1.7–2.4)

## 2024-04-21 MED ORDER — SODIUM CHLORIDE 0.9 % IV BOLUS: 1000.0000 mL | Freq: Once | INTRAVENOUS | Status: DC

## 2024-04-21 NOTE — TOC Progression Note (Addendum)
 Transition of Care [facility]) - Progression Note    Patient Details  Name: Dustin Hess MRN: [id number] Date of Birth: 09/12/1946  Transition of Care [facility] CM/SW Contact  Cortlynn Hollinsworth LITTIE Moose, LCSW Phone Number: 04/21/2024, 3:28 PM  Clinical Narrative:    8:19- CSW informed MD of peer to peer due at 12:00  01:22- Insurance declined pt returning to SNF.  3:38PM- CSW called pt son, Curtistine, and explained that insurance denied pt return. CSW explained to Curtistine we can appeal pt discharge if they would like but Curtistine said they were comfortable d/c home and did not want to proceed with the appeal.                      Expected Discharge Plan and Services                                               Social Drivers of Health (SDOH) Interventions SDOH Screenings   Food Insecurity: No Food Insecurity (04/15/2024)  Housing: Unknown (04/15/2024)  Transportation Needs: No Transportation Needs (04/15/2024)  Utilities: Not At Risk (04/15/2024)  Social Connections: Socially Isolated (04/15/2024)  Tobacco Use: Low Risk  (04/14/2024)    Readmission Risk Interventions     No data to display

## 2024-04-21 NOTE — Hospital Course (Addendum)
 78 year old male with history of CAD with PCI in 2005, HTN, hypothyroidism, blindness who comes in from [facility] with increased fatigue, low-grade fever. Patient had abdominal pain the day prior without nausea or vomiting. There is no reported cough or chest congestion. CT scan of the abdomen and pelvis on admission showed possible pulmonary infiltrates as well as dilated gallbladder with some wall edema. Surgery was consulted, he was placed on antibiotics and admitted to the hospital  Underwent MRI MRCP seen by surgery concern for cholecystitis they advised conservative management with antibiotics, at this time clinically stabilized tolerating p.o. awaiting for placement Insurance has declined despite peer-to-peer> at this time family wants to take him home with home health Patient was kept additional days to monitor and optimize functional status before discharge home  Subjective: Patient seen and examined this morning Overnight afebrile BP stable.   No more Hypoglycemia tolerating p.o.   Discharge diagnosis  Cholelithiasis, concern for cholecystitis: no significant abdominal pain, but US \ -stones and sludge.His LFTs are elevated, and bilirubin was up. S/p MRI/MRCP-again raise concern for cholecystitis and there were no signs of choledocholithiasis. CCS recommend conservative treatment and antibiotics, at this time clinically stabilized LFTs stable tolerating diet. Remains weak and deconditioned Cont HHPTOT   Possible pneumonia: No cough or respiratory symptoms. Complete antibiotics for #1  CAD: no chest pain, continue metoprolol , Imdur   Hypothyroidism: continue Synthroid   Borderline blood sugar: With poor oral intake cont  Ensure and supplement. No more hypoglycemia  BPH: continue tamsulosin    Blind both eyes

## 2024-04-21 NOTE — Progress Notes (Signed)
 PROGRESS NOTE TOBENNA NEEDS  FMW:[id number] DOB: [birth year] DOA: 04/14/2024 PCP: [name], MD  Brief Narrative/Hospital Course: 78 year old male with history of CAD with PCI in 2005, HTN, hypothyroidism, blindness who comes in from [place] with increased fatigue, low-grade fever. Patient had abdominal pain the day prior without nausea or vomiting. There is no reported cough or chest congestion. CT scan of the abdomen and pelvis on admission showed possible pulmonary infiltrates as well as dilated gallbladder with some wall edema. Surgery was consulted, he was placed on antibiotics and admitted to the hospital  Underwent MRI MRCP seen by surgery concern for cholecystitis they advised conservative management with antibiotics, at this time clinically stabilized tolerating p.o. awaiting for placement   Subjective: Seen and examined Blind.No complaints Overnight afebrile BP stable Labs fairly stable on 7/23  Assessment and plan:  Cholelithiasis, concern for cholecystitis: no significant abdominal pain, but US \ -stones and sludge.His LFTs are elevated, and bilirubin was up. S/p MRI/MRCP-again raise concern for cholecystitis and there were no signs of choledocholithiasis. CCS recommend conservative treatment and antibiotics, at this time clinically stabilized LFTs stable tolerating diet  Insurance authorization is still pending.   Possible pneumonia: No cough or respiratory symptoms. Complete antibiotics for #1  CAD: no chest pain, continue metoprolol , Imdur   Hypothyroidism: continue Synthroid   BPH: continue tamsulosin    Peer-to-peer was requested and I have called the insurance company and left my cell phone for callback from the medical director His prior level of function- needed some assistance and was having trouble getting out of bed independently (from previous admission) his family sometimes is able to help him out but now he needs a lot of assistance with  bathing/dressing/ going to bathroom/ walking and also needs supervision due to his cognitive status .  DVT prophylaxis: SCDs Start: 04/14/24 2129 Code Status:   Code Status: Full Code Family Communication: plan of care discussed with patient at bedside. Patient status is: Remains hospitalized because of severity of illness Level of care: Telemetry Medical   Dispo: The patient is from: home            Anticipated disposition: SNF Objective: Vitals last 24 hrs: Vitals:   04/21/24 0025 04/21/24 0352 04/21/24 0753 04/21/24 1106  BP: (!) 156/82 (!) 157/79 (!) 151/79 (!) 151/79  Pulse:  76 (!) 43 (!) 43  Resp:  18 17   Temp:  98.9 F (37.2 C) 98.6 F (37 C)   TempSrc:  Axillary    SpO2:  100% 100%   Weight:      Height:        Physical Examination: General exam: alert awake, older than stated age HEENT:Oral mucosa moist, Ear/Nose WNL grossly Respiratory system: Bilaterally CLEAR BS, no use of accessory muscle Cardiovascular system: S1 & S2 +. Gastrointestinal system: Abdomen soft, NT,ND,BS+ Nervous System: Alert, awake, following commands. Extremities: LE edema neg, warm extremities Skin: No rashes,warm. MSK: Normal muscle bulk/tone.   Data Reviewed: I have personally reviewed following labs and imaging studies ( see epic result tab) CBC: Recent Labs  Lab 04/14/24 1343 04/15/24 0257 04/16/24 0559 04/17/24 0415 04/18/24 0756 04/21/24 0536  WBC 8.6 8.1 5.2 4.9 3.7* 4.6  NEUTROABS 7.7  --   --   --   --   --   HGB 13.7 12.5* 13.7 13.0 12.8* 13.9  HCT 41.1 37.8* 41.5 38.6* 38.2* 42.4  MCV 95.4 94.7 94.5 93.0 93.4 94.2  PLT 159 135* 133* 129* 129* 122*   CMP:  Recent Labs  Lab 04/15/24 0257 04/16/24 0559 04/17/24 0415 04/18/24 0756 04/21/24 0536  NA 141 143 140 140 140  K 3.8 3.6 3.4* 3.9 4.2  CL 109 107 107 106 104  CO2 24 25 25 24 26   GLUCOSE 90 100* 98 106* 96  BUN 18 18 10 10 12   CREATININE 0.91 0.93 0.83 0.87 1.08  CALCIUM  9.3 9.6 9.2 9.3 9.7  MG 2.0 1.8  1.7 1.9 2.2  PHOS 3.5  --   --   --   --    GFR: Estimated Creatinine Clearance: 44.6 mL/min (by C-G formula based on SCr of 1.08 mg/dL). Recent Labs  Lab 04/15/24 0257 04/16/24 0559 04/17/24 0415 04/18/24 0756 04/21/24 0536  AST 224* 108* 81* 67* 41  ALT 319* 232* 169* 138* 92*  ALKPHOS 154* 145* 130* 123 107  BILITOT 3.5* 2.3* 2.0* 1.3* 1.1  PROT 6.1* 7.1 6.7 6.6 6.8  ALBUMIN 3.1* 3.3* 3.0* 3.0* 3.2*    Recent Labs  Lab 04/14/24 1939  LIPASE 28   No results for input(s): AMMONIA in the last 168 hours. Coagulation Profile:  Recent Labs  Lab 04/14/24 1343  INR 1.3*   Unresulted Labs (From admission, onward)    None      Antimicrobials/Microbiology: Anti-infectives (From admission, onward)    Start     Dose/Rate Route Frequency Ordered Stop   04/19/24 1800  azithromycin  (ZITHROMAX ) 500 mg in sodium chloride  0.9 % 250 mL IVPB        500 mg 250 mL/hr over 60 Minutes Intravenous Every 24 hours 04/19/24 1529 04/19/24 1831   04/19/24 0000  amoxicillin -clavulanate (AUGMENTIN ) 875-125 MG tablet        1 tablet Oral 2 times daily 04/19/24 1239 04/23/24 2359   04/15/24 1800  azithromycin  (ZITHROMAX ) 500 mg in sodium chloride  0.9 % 250 mL IVPB  Status:  Discontinued        500 mg 250 mL/hr over 60 Minutes Intravenous Every 24 hours 04/14/24 2102 04/19/24 1529   04/15/24 0200  piperacillin -tazobactam (ZOSYN ) IVPB 3.375 g        3.375 g 12.5 mL/hr over 240 Minutes Intravenous Every 8 hours 04/14/24 2105     04/14/24 1745  piperacillin -tazobactam (ZOSYN ) IVPB 3.375 g        3.375 g 100 mL/hr over 30 Minutes Intravenous  Once 04/14/24 1744 04/14/24 1913         Component Value Date/Time   SDES BLOOD SITE NOT SPECIFIED 04/15/2024 0415   SPECREQUEST  04/15/2024 0415    BOTTLES DRAWN AEROBIC AND ANAEROBIC Blood Culture adequate volume   CULT  04/15/2024 0415    NO GROWTH 5 DAYS Performed at [facility], [address] [place], [place] [address]    REPTSTATUS  04/20/2024 FINAL 04/15/2024 0415    Procedures:  Medications reviewed:  Scheduled Meds:  hydrALAZINE   10 mg Oral Q6H   isosorbide  mononitrate  30 mg Oral Daily   levothyroxine   88 mcg Oral Q0600   metoprolol  succinate  25 mg Oral Daily   polyethylene glycol  17 g Oral BID   tamsulosin   0.8 mg Oral Daily   Continuous Infusions:  piperacillin -tazobactam (ZOSYN )  IV 3.375 g (04/21/24 0629)    Mennie LAMY, MD [facility] 04/21/2024, 1:12 PM

## 2024-04-21 NOTE — Progress Notes (Signed)
 Notified by Dustin Hess, NT of pt's vitals below which initiated yellow MEWS. Notified Dr. Mennie Lamy and CN, Kate Ada, RN. Per NT vitals were taken while pt was on bedside commode. MD put in new orders. Pt transferred back to bed and vitals recheck BP 110/55 MAP 70 PULSE 89. Clarified orders with MD. Pt no s/s of distress and says he feels fine. No yellow MEWS protocol necessary.   04/21/24 1552  Vitals  Temp 97.7 F (36.5 C)  Temp Source Oral  BP (!) 72/52  MAP (mmHg) (!) 60  BP Location Left Arm  BP Method Automatic  Patient Position (if appropriate) Sitting  Pulse Rate 88  Pulse Rate Source Dinamap  Resp 18  MEWS COLOR  MEWS Score Color Yellow  Oxygen Therapy  SpO2 100 %  O2 Device Room Air  MEWS Score  MEWS Temp 0  MEWS Systolic 2  MEWS Pulse 0  MEWS RR 0  MEWS LOC 0  MEWS Score 2

## 2024-04-21 NOTE — Progress Notes (Signed)
 Physical Therapy Treatment Patient Details Name: Dustin Hess MRN: [id number] DOB: [birth year] Today's Date: 04/21/2024   History of Present Illness 78 year old male who comes in from [place] with increased fatigue, low-grade fever. CT scan of the abdomen and pelvis on admission showed possible pulmonary infiltrates as well as dilated gallbladder with some wall edema. history of CAD with PCI in 2005, HTN, hypothyroidism, blindness.    PT Comments  Pt resting in bed on arrival, eager for OOB mobility and demonstrating good progress towards acute goals. Pt able to progress OOB transfers this session, coming to stand x4 throughout session with HHA and up to RW with mod A fading to min A with practice and cues for anterior weight shift. Pt requiring mod A to step pivot EOB>chair at start of session with HHA due to posterior lean and increased cueing needed due to visual deficits. Pt able to perform standing pre-gait activities, marching in standing with BUE support on RW and light min A to for balance. Pt up in chair at end of session with sister present at bedside with all needs met. EKG/telemetry sicker applied to RN call button with pt able to locate and utilize and verbalizing understanding to press for any needs. RN updated and to locate soft touch call bell for pt as well. Patient will benefit from continued inpatient follow up therapy, <3 hours/day to address deficits and maximize functional independence and decrease caregiver burden. Pt continues to benefit from skilled PT services to progress toward functional mobility goals.      If plan is discharge home, recommend the following: A lot of help with bathing/dressing/bathroom;Supervision due to cognitive status;Assistance with cooking/housework;Assist for transportation;Help with stairs or ramp for entrance;A lot of help with walking and/or transfers   Can travel by private vehicle     No  Equipment Recommendations  Other (comment)  (TBA)    Recommendations for Other Services       Precautions / Restrictions Precautions Precautions: Fall Recall of Precautions/Restrictions: Impaired Precaution/Restrictions Comments: Blind Restrictions Weight Bearing Restrictions Per Provider Order: No     Mobility  Bed Mobility Overal bed mobility: Needs Assistance Bed Mobility: Supine to Sit     Supine to sit: HOB elevated, Used rails, Mod assist     General bed mobility comments: mod A to bring LEs to and off EOB and to elevate trunk to sitting, once sitting pt able to scoot out to EOB with increased time    Transfers Overall transfer level: Needs assistance Equipment used: 1 person hand held assist, Rolling walker (2 wheels) Transfers: Sit to/from Stand, Bed to chair/wheelchair/BSC Sit to Stand: Mod assist, Min assist   Step pivot transfers: Mod assist       General transfer comment: pt standing from bed at lowest height x1, from chair x3, mod A initially with HHA due to posterior lean, improving with practice to light min A, pt standin to RW on last attempt and able to maintain standing balance without CGA    Ambulation/Gait             Pre-gait activities: standing marching x20 with RW for support     Stairs             Wheelchair Mobility     Tilt Bed    Modified Rankin (Stroke Patients Only)       Balance Overall balance assessment: Needs assistance Sitting-balance support: Feet supported Sitting balance-Leahy Scale: Fair Sitting balance - Comments: EOB   Standing balance  support: Single extremity supported Standing balance-Leahy Scale: Poor Standing balance comment: reliant on RW for support or HHA and min A to maintain static standing                            Communication Communication Communication: Impaired Factors Affecting Communication: Reduced clarity of speech;Hearing impaired  Cognition Arousal: Alert Behavior During Therapy: WFL for tasks  assessed/performed   PT - Cognitive impairments: Sequencing                       PT - Cognition Comments: cues for sequencing due to visual deficits Following commands: Intact Following commands impaired: Follows multi-step commands inconsistently    Cueing Cueing Techniques: Verbal cues, Gestural cues, Tactile cues  Exercises      General Comments General comments (skin integrity, edema, etc.): VSS on RA, pt sister present and supportive      Pertinent Vitals/Pain Pain Assessment Pain Assessment: Faces Pain Score: 0-No pain Pain Intervention(s): Monitored during session    Home Living                          Prior Function            PT Goals (current goals can now be found in the care plan section) Acute Rehab PT Goals Patient Stated Goal: to get stronger PT Goal Formulation: With patient/family Time For Goal Achievement: 04/14/24 Progress towards PT goals: Progressing toward goals    Frequency    Min 2X/week      PT Plan      Co-evaluation              AM-PAC PT 6 Clicks Mobility   Outcome Measure  Help needed turning from your back to your side while in a flat bed without using bedrails?: A Lot Help needed moving from lying on your back to sitting on the side of a flat bed without using bedrails?: A Lot Help needed moving to and from a bed to a chair (including a wheelchair)?: A Lot Help needed standing up from a chair using your arms (e.g., wheelchair or bedside chair)?: A Little Help needed to walk in hospital room?: A Lot Help needed climbing 3-5 steps with a railing? : Total 6 Click Score: 12    End of Session Equipment Utilized During Treatment: Gait belt Activity Tolerance: Patient limited by fatigue;Patient tolerated treatment well Patient left: in chair;with call bell/phone within reach;with chair alarm set Nurse Communication: Mobility status (+2 for back to bed for safety due to visual deficits) PT Visit  Diagnosis: Other abnormalities of gait and mobility (R26.89);Muscle weakness (generalized) (M62.81);Other symptoms and signs involving the nervous system (R29.898)     Time: 8598-8567 PT Time Calculation (min) (ACUTE ONLY): 31 min  Charges:    $Gait Training: 8-22 mins $Therapeutic Activity: 8-22 mins PT General Charges $$ ACUTE PT VISIT: 1 Visit                     Cristen Bredeson R. PTA Acute Rehabilitation Services Office: [phone]   [name] 04/21/2024, 2:44 PM

## 2024-04-21 NOTE — Plan of Care (Signed)

## 2024-04-21 NOTE — Plan of Care (Signed)
  Problem: Coping: Goal: Ability to adjust to condition or change in health will improve Outcome: Progressing   Problem: Health Behavior/Discharge Planning: Goal: Ability to identify and utilize available resources and services will improve Outcome: Progressing   Problem: Nutritional: Goal: Maintenance of adequate nutrition will improve Outcome: Progressing   Problem: Activity: Goal: Risk for activity intolerance will decrease Outcome: Progressing

## 2024-04-22 DIAGNOSIS — K819 Cholecystitis, unspecified: Secondary | ICD-10-CM | POA: Diagnosis not present

## 2024-04-22 LAB — GLUCOSE, CAPILLARY
Glucose-Capillary: 103 mg/dL — ABNORMAL HIGH (ref 70–99)
Glucose-Capillary: 94 mg/dL (ref 70–99)
Glucose-Capillary: 131 mg/dL — ABNORMAL HIGH (ref 70–99)
Glucose-Capillary: 96 mg/dL (ref 70–99)
Glucose-Capillary: 143 mg/dL — ABNORMAL HIGH (ref 70–99)

## 2024-04-22 MED ORDER — ENSURE PLUS HIGH PROTEIN PO LIQD
237.0000 mL | Freq: Two times a day (BID) | ORAL | Status: DC
  Administered 2024-04-22: 237 mL via ORAL

## 2024-04-22 MED ORDER — HEPARIN SODIUM (PORCINE) 5000 UNIT/ML IJ SOLN
5000.0000 [IU] | Freq: Three times a day (TID) | INTRAMUSCULAR | Status: DC
  Administered 2024-04-22 – 2024-04-23 (×2): 5000 [IU] via SUBCUTANEOUS
  Filled 2024-04-22 (×2): qty 1

## 2024-04-22 NOTE — TOC Initial Note (Signed)
 Transition of Care (TOC) - Initial/Assessment Note   Unable to reach wife by phone. No family in room at present.   Called patient's son Dustin Hess . Family plans to take patient home with home health. No preference for home health agency. Kelly with [facility] accepted.   Son requesting walker for home. Ordered with Zachary with [facility]  Patient Details  Name: Dustin Hess MRN: [id number] Date of Birth: January 24, 1946  Transition of Care [facility]) CM/SW Contact:    Stephane Powell Jansky, RN Phone Number: 04/22/2024, 11:46 AM  Clinical Narrative:                   Expected Discharge Plan: Home w Home Health Services Barriers to Discharge: Continued Medical Work up   Patient Goals and CMS Choice Patient states their goals for this hospitalization and ongoing recovery are:: to return to home CMS Medicare.gov Compare Post Acute Care list provided to:: Patient Represenative (must comment) (son Dustin Hess)        Expected Discharge Plan and Services In-house Referral: Clinical Social Work Discharge Planning Services: CM Consult Post Acute Care Choice: Home Health, Durable Medical Equipment Living arrangements for the past 2 months: Single Family Home                 DME Arranged: Walker rolling DME Agency: [facility] Date DME Agency Contacted: 04/22/24 Time DME Agency Contacted: 1145 Representative spoke with at DME Agency: [facility] HH Arranged: PT, OT, Nurse's Aide HH Agency: [facility] Date [facility] Agency Contacted: 04/22/24 Time HH Agency Contacted: 1145 Representative spoke with at [facility] Agency: [facility]  Prior Living Arrangements/Services Living arrangements for the past 2 months: Single Family Home Lives with:: Adult Children, Spouse Patient language and need for interpreter reviewed:: Yes Do you feel safe going back to the place where you live?: Yes      Need for Family Participation in Patient Care: Yes (Comment) Care giver support system in place?: Yes  (comment) Current home services: DME Criminal Activity/Legal Involvement Pertinent to Current Situation/Hospitalization: No - Comment as needed  Activities of Daily Living   ADL Screening (condition at time of admission) Independently performs ADLs?: No Does the patient have a NEW difficulty with bathing/dressing/toileting/self-feeding that is expected to last >3 days?: No Does the patient have a NEW difficulty with getting in/out of bed, walking, or climbing stairs that is expected to last >3 days?: No Does the patient have a NEW difficulty with communication that is expected to last >3 days?: No Is the patient deaf or have difficulty hearing?: No Does the patient have difficulty seeing, even when wearing glasses/contacts?: Yes Does the patient have difficulty concentrating, remembering, or making decisions?: Yes  Permission Sought/Granted Permission sought to share information with : Family Supports Permission granted to share information with : Yes, Verbal Permission Granted  Share Information with NAME: son Dustin Hess , wife  Permission granted to share info w AGENCY: DME agencies and home health agencies        Emotional Assessment Appearance:: Appears stated age     Orientation: : Oriented to Self Alcohol / Substance Use: Not Applicable Psych Involvement: No (comment)  Admission diagnosis:  Biliary sludge [K83.8] Cholecystitis [K81.9] Transaminitis [R74.01] Patient Active Problem List   Diagnosis Date Noted   Acute cholecystitis 04/14/2024   Cholecystitis 04/14/2024   CAP (community acquired pneumonia) 04/14/2024   Physical deconditioning 03/31/2024   Dyslipidemia 08/01/2019   Educated about COVID-19 virus infection 01/19/2019   CAD (coronary artery disease) of artery  bypass graft 11/08/2015   Leukopenia 12/22/2013   Thrombocytopenia, unspecified (HCC) 12/22/2013   Blindness, congenital 04/28/2013   GERD (gastroesophageal reflux disease) 04/28/2013   Benign prostatic  hyperplasia 04/28/2013   Hypothyroidism 12/13/2008   HYPERLIPIDEMIA 12/13/2008   Essential hypertension, benign 12/13/2008   Coronary atherosclerosis 12/13/2008   PCP:  [name], MD Pharmacy:   [facility] - [place], [place] - [address] [place] [address] Phone: [phone] Fax: [phone]     Social Drivers of Health (SDOH) Social History: SDOH Screenings   Food Insecurity: No Food Insecurity (04/15/2024)  Housing: Unknown (04/15/2024)  Transportation Needs: No Transportation Needs (04/15/2024)  Utilities: Not At Risk (04/15/2024)  Social Connections: Socially Isolated (04/15/2024)  Tobacco Use: Low Risk  (04/14/2024)   SDOH Interventions:     Readmission Risk Interventions     No data to display

## 2024-04-22 NOTE — Plan of Care (Signed)
  Problem: Coping: Goal: Ability to adjust to condition or change in health will improve Outcome: Progressing   Problem: Metabolic: Goal: Ability to maintain appropriate glucose levels will improve Outcome: Progressing   Problem: Nutritional: Goal: Maintenance of adequate nutrition will improve Outcome: Progressing   Problem: Skin Integrity: Goal: Risk for impaired skin integrity will decrease Outcome: Progressing   

## 2024-04-22 NOTE — Progress Notes (Signed)
 Physical Therapy Treatment Patient Details Name: Dustin Hess MRN: [id number] DOB: 05-Sep-1946 Today's Date: 04/22/2024   History of Present Illness 78 year old male who comes in from [place] with increased fatigue, low-grade fever. CT scan of the abdomen and pelvis on admission showed possible pulmonary infiltrates as well as dilated gallbladder with some wall edema. history of CAD with PCI in 2005, HTN, hypothyroidism, blindness.    PT Comments  Pt resting in bed on arrival and agreeable to session. Pt demonstrating good progress towards acute goals. Pt able to progress gait training this session with min A to facilitate anterior weight shift as pt with tendency for slight posterior lean, as well as provide assist to manage RW and navigate due to visual deficits. Pt up in chair at end of session with all needs met. Pt was educated on continued walker use to maximize functional independence, safety, and decrease risk for falls.  Patient will benefit from continued inpatient follow up therapy, <3 hours/day. Pt continues to benefit from skilled PT services to progress toward functional mobility goals.      If plan is discharge home, recommend the following: A lot of help with bathing/dressing/bathroom;Supervision due to cognitive status;Assistance with cooking/housework;Assist for transportation;Help with stairs or ramp for entrance;A lot of help with walking and/or transfers   Can travel by private vehicle     No  Equipment Recommendations  Other (comment) (TBA)    Recommendations for Other Services OT consult     Precautions / Restrictions Precautions Precautions: Fall Recall of Precautions/Restrictions: Impaired Precaution/Restrictions Comments: Blind Restrictions Weight Bearing Restrictions Per Provider Order: No     Mobility  Bed Mobility Overal bed mobility: Needs Assistance Bed Mobility: Supine to Sit     Supine to sit: Min assist, HOB elevated     General  bed mobility comments: light min A to scoot out to EOB, pt bringing LEs to L side of bed and able to elevate trunk to sitting without assist    Transfers Overall transfer level: Needs assistance Equipment used: Rolling walker (2 wheels) Transfers: Sit to/from Stand, Bed to chair/wheelchair/BSC Sit to Stand: Min assist           General transfer comment: pt standing from bed at lowest height with min A to facilitate anterior weight shift as pt with slight posterior lean initially    Ambulation/Gait Ambulation/Gait assistance: Min assist Gait Distance (Feet): 35 Feet Assistive device: Rolling walker (2 wheels) Gait Pattern/deviations: Shuffle, Trunk flexed, Decreased stride length, Step-through pattern, Knee flexed in stance - right, Knee flexed in stance - left Gait velocity: decr     General Gait Details: pt ambulating with very short step through pattern with RW for support, hands on assist to guide RW dur to visual deficits, pt with knees flexed throughout but without buckling   Stairs             Wheelchair Mobility     Tilt Bed    Modified Rankin (Stroke Patients Only)       Balance Overall balance assessment: Needs assistance Sitting-balance support: Feet supported Sitting balance-Leahy Scale: Fair Sitting balance - Comments: EOB   Standing balance support: Single extremity supported Standing balance-Leahy Scale: Poor Standing balance comment: reliant on RW for support or HHA and min A to maintain static standing                            Communication Communication Communication: Impaired Factors Affecting  Communication: Reduced clarity of speech;Hearing impaired  Cognition Arousal: Alert Behavior During Therapy: WFL for tasks assessed/performed   PT - Cognitive impairments: Sequencing                       PT - Cognition Comments: cues for sequencing due to visual deficits Following commands: Intact Following commands  impaired: Follows multi-step commands inconsistently    Cueing Cueing Techniques: Verbal cues, Gestural cues, Tactile cues  Exercises      General Comments General comments (skin integrity, edema, etc.): VSS      Pertinent Vitals/Pain Pain Assessment Pain Assessment: No/denies pain Pain Intervention(s): Monitored during session    Home Living                          Prior Function            PT Goals (current goals can now be found in the care plan section) Acute Rehab PT Goals Patient Stated Goal: to get stronger PT Goal Formulation: With patient/family Time For Goal Achievement: 04/14/24 Progress towards PT goals: Progressing toward goals    Frequency    Min 2X/week      PT Plan      Co-evaluation              AM-PAC PT 6 Clicks Mobility   Outcome Measure  Help needed turning from your back to your side while in a flat bed without using bedrails?: A Lot Help needed moving from lying on your back to sitting on the side of a flat bed without using bedrails?: A Lot Help needed moving to and from a bed to a chair (including a wheelchair)?: A Little Help needed standing up from a chair using your arms (e.g., wheelchair or bedside chair)?: A Little Help needed to walk in hospital room?: A Little Help needed climbing 3-5 steps with a railing? : Total 6 Click Score: 14    End of Session Equipment Utilized During Treatment: Gait belt Activity Tolerance: Patient tolerated treatment well Patient left: in chair;with call bell/phone within reach;with chair alarm set Nurse Communication: Mobility status;Other (comment) (IV out) PT Visit Diagnosis: Other abnormalities of gait and mobility (R26.89);Muscle weakness (generalized) (M62.81);Other symptoms and signs involving the nervous system (R29.898)     Time: 8990-8966 PT Time Calculation (min) (ACUTE ONLY): 24 min  Charges:    $Gait Training: 8-22 mins $Therapeutic Activity: 8-22 mins PT  General Charges $$ ACUTE PT VISIT: 1 Visit                     Keshun Berrett R. PTA Acute Rehabilitation Services Office: [phone]   [name] 04/22/2024, 11:14 AM

## 2024-04-22 NOTE — Plan of Care (Signed)

## 2024-04-22 NOTE — Progress Notes (Signed)
 PROGRESS NOTE Dustin Hess  FMW:[id number] DOB: 05/14/1946 DOA: 04/14/2024 PCP: [name], MD  Brief Narrative/Hospital Course: 78 year old male with history of CAD with PCI in 2005, HTN, hypothyroidism, blindness who comes in from [place] with increased fatigue, low-grade fever. Patient had abdominal pain the day prior without nausea or vomiting. There is no reported cough or chest congestion. CT scan of the abdomen and pelvis on admission showed possible pulmonary infiltrates as well as dilated gallbladder with some wall edema. Surgery was consulted, he was placed on antibiotics and admitted to the hospital  Underwent MRI MRCP seen by surgery concern for cholecystitis they advised conservative management with antibiotics, at this time clinically stabilized tolerating p.o. awaiting for placement Insurance has declined despite peer-to-peer> at this time family wants to take him home with home health  Subjective: Patient seen and examined About work with PT OT but is very weak able to walk only few steps Overnight afebrile BP stable blood sugars on lower side 65 last night improved since   Assessment and plan:  Cholelithiasis, concern for cholecystitis: no significant abdominal pain, but US \ -stones and sludge.His LFTs are elevated, and bilirubin was up. S/p MRI/MRCP-again raise concern for cholecystitis and there were no signs of choledocholithiasis. CCS recommend conservative treatment and antibiotics, at this time clinically stabilized LFTs stable tolerating diet. Remains weak and deconditioned with continue with additional  day of PT OT to ensure safe discharge   Possible pneumonia: No cough or respiratory symptoms. Complete antibiotics for #1  CAD: no chest pain, continue metoprolol , Imdur   Hypothyroidism: continue Synthroid   Borderline blood sugar: With poor oral intake add Ensure and supplement  BPH: continue tamsulosin    Peer-to-peer was requested and I  have called the insurance company and left my cell phone for callback from the medical director His prior level of function- needed some assistance and was having trouble getting out of bed independently (from previous admission) his family sometimes is able to help him out but now he needs a lot of assistance with bathing/dressing/ going to bathroom/ walking and also needs supervision due to his cognitive status . SNF has been declined by insurance family okay taking him home with home alone stable  DVT prophylaxis: SCDs Start: 04/14/24 2129 Code Status:   Code Status: Full Code Family Communication: plan of care discussed with patient at bedside. Patient status is: Remains hospitalized because of severity of illness Level of care: Telemetry Medical   Dispo: The patient is from: home            Anticipated disposition: hh I n24 hr if stable Objective: Vitals last 24 hrs: Vitals:   04/21/24 1610 04/21/24 2001 04/22/24 0345 04/22/24 0752  BP: (!) 110/55 112/77 (!) 152/85 (!) 153/84  Pulse: 89 83 81 (!) 45  Resp:  16 16 16   Temp:  98 F (36.7 C) 98 F (36.7 C) 98.8 F (37.1 C)  TempSrc:    Oral  SpO2:  100% 98% 97%  Weight:      Height:        Physical Examination: General exam: alert awake, oriented, blind HEENT:Oral mucosa moist, Ear/Nose WNL grossly Respiratory system: Bilaterally clear BS,no use of accessory muscle Cardiovascular system: S1 & S2 +, No JVD. Gastrointestinal system: Abdomen soft,NT,ND, BS+ Nervous System: Alert, awake, moving all extremities,and following commands. Extremities: LE edema neg,distal peripheral pulses palpable and warm.  Skin: No rashes,no icterus. MSK: Weak and frail   data Reviewed: I have personally reviewed following  labs and imaging studies ( see epic result tab) CBC: Recent Labs  Lab 04/16/24 0559 04/17/24 0415 04/18/24 0756 04/21/24 0536  WBC 5.2 4.9 3.7* 4.6  HGB 13.7 13.0 12.8* 13.9  HCT 41.5 38.6* 38.2* 42.4  MCV 94.5 93.0  93.4 94.2  PLT 133* 129* 129* 122*   CMP: Recent Labs  Lab 04/16/24 0559 04/17/24 0415 04/18/24 0756 04/21/24 0536  NA 143 140 140 140  K 3.6 3.4* 3.9 4.2  CL 107 107 106 104  CO2 25 25 24 26   GLUCOSE 100* 98 106* 96  BUN 18 10 10 12   CREATININE 0.93 0.83 0.87 1.08  CALCIUM  9.6 9.2 9.3 9.7  MG 1.8 1.7 1.9 2.2   GFR: Estimated Creatinine Clearance: 44.6 mL/min (by C-G formula based on SCr of 1.08 mg/dL). Recent Labs  Lab 04/16/24 0559 04/17/24 0415 04/18/24 0756 04/21/24 0536  AST 108* 81* 67* 41  ALT 232* 169* 138* 92*  ALKPHOS 145* 130* 123 107  BILITOT 2.3* 2.0* 1.3* 1.1  PROT 7.1 6.7 6.6 6.8  ALBUMIN 3.3* 3.0* 3.0* 3.2*    No results for input(s): LIPASE, AMYLASE in the last 168 hours.  No results for input(s): AMMONIA in the last 168 hours. Coagulation Profile:  No results for input(s): INR, PROTIME in the last 168 hours.  Unresulted Labs (From admission, onward)    None      Antimicrobials/Microbiology: Anti-infectives (From admission, onward)    Start     Dose/Rate Route Frequency Ordered Stop   04/19/24 1800  azithromycin  (ZITHROMAX ) 500 mg in sodium chloride  0.9 % 250 mL IVPB        500 mg 250 mL/hr over 60 Minutes Intravenous Every 24 hours 04/19/24 1529 04/19/24 1831   04/19/24 0000  amoxicillin -clavulanate (AUGMENTIN ) 875-125 MG tablet        1 tablet Oral 2 times daily 04/19/24 1239 04/23/24 2359   04/15/24 1800  azithromycin  (ZITHROMAX ) 500 mg in sodium chloride  0.9 % 250 mL IVPB  Status:  Discontinued        500 mg 250 mL/hr over 60 Minutes Intravenous Every 24 hours 04/14/24 2102 04/19/24 1529   04/15/24 0200  piperacillin -tazobactam (ZOSYN ) IVPB 3.375 g        3.375 g 12.5 mL/hr over 240 Minutes Intravenous Every 8 hours 04/14/24 2105     04/14/24 1745  piperacillin -tazobactam (ZOSYN ) IVPB 3.375 g        3.375 g 100 mL/hr over 30 Minutes Intravenous  Once 04/14/24 1744 04/14/24 1913         Component Value Date/Time   SDES  BLOOD SITE NOT SPECIFIED 04/15/2024 0415   SPECREQUEST  04/15/2024 0415    BOTTLES DRAWN AEROBIC AND ANAEROBIC Blood Culture adequate volume   CULT  04/15/2024 0415    NO GROWTH 5 DAYS Performed at [facility], [address] [place], [place] [address]    REPTSTATUS 04/20/2024 FINAL 04/15/2024 0415    Procedures:  Medications reviewed:  Scheduled Meds:  feeding supplement  237 mL Oral BID BM   isosorbide  mononitrate  30 mg Oral Daily   levothyroxine   88 mcg Oral Q0600   polyethylene glycol  17 g Oral BID   tamsulosin   0.8 mg Oral Daily   Continuous Infusions:  piperacillin -tazobactam (ZOSYN )  IV 3.375 g (04/22/24 0703)    Mennie LAMY, MD [facility] 04/22/2024, 10:29 AM

## 2024-04-23 DIAGNOSIS — K819 Cholecystitis, unspecified: Secondary | ICD-10-CM | POA: Diagnosis not present

## 2024-04-23 LAB — GLUCOSE, CAPILLARY
Glucose-Capillary: 100 mg/dL — ABNORMAL HIGH (ref 70–99)
Glucose-Capillary: 123 mg/dL — ABNORMAL HIGH (ref 70–99)
Glucose-Capillary: 132 mg/dL — ABNORMAL HIGH (ref 70–99)
Glucose-Capillary: 97 mg/dL (ref 70–99)

## 2024-04-23 MED ORDER — ENSURE PLUS HIGH PROTEIN PO LIQD: 237.0000 mL | Freq: Two times a day (BID) | ORAL | 0 refills | Status: AC

## 2024-04-23 NOTE — Discharge Summary (Signed)
 Physician Discharge Summary  Dustin Hess FMW:[id number] DOB: [birth year] DOA: 04/14/2024  PCP: Dustin Leni Edyth DELENA, MD  Admit date: 04/14/2024 Discharge date: 04/23/2024 Recommendations for Outpatient Follow-up:  Follow up with PCP in 1 weeks-call for appointment Please obtain BMP/CBC in one week  Discharge Dispo: home w/ [facility] Discharge Condition: Stable Code Status:   Code Status: Full Code Diet recommendation:  Diet Order             DIET DYS 2 Fluid consistency: Thin  Diet effective now                    Brief/Interim Summary: 78 year old male with history of CAD with PCI in 2005, HTN, hypothyroidism, blindness who comes in from Dustin Hess with increased fatigue, low-grade fever. Patient had abdominal pain the day prior without nausea or vomiting. There is no reported cough or chest congestion. CT scan of the abdomen and pelvis on admission showed possible pulmonary infiltrates as well as dilated gallbladder with some wall edema. Surgery was consulted, he was placed on antibiotics and admitted to the hospital  Underwent MRI MRCP seen by surgery concern for cholecystitis they advised conservative management with antibiotics, at this time clinically stabilized tolerating p.o. awaiting for placement Insurance has declined despite peer-to-peer> at this time family wants to take him home with home health Patient was kept additional days to monitor and optimize functional status before discharge home  Subjective: Patient seen and examined this morning Overnight afebrile BP stable.   No more Hypoglycemia tolerating p.o.   Discharge diagnosis  Cholelithiasis, concern for cholecystitis: no significant abdominal pain, but US \ -stones and sludge.His LFTs are elevated, and bilirubin was up. S/p MRI/MRCP-again raise concern for cholecystitis and there were no signs of choledocholithiasis. CCS recommend conservative treatment and antibiotics, at this time clinically stabilized LFTs  stable tolerating diet. Remains weak and deconditioned Cont HHPTOT   Possible pneumonia: No cough or respiratory symptoms. Complete antibiotics for #1  CAD: no chest pain, continue metoprolol , Imdur   Hypothyroidism: continue Synthroid   Borderline blood sugar: With poor oral intake cont  Ensure and supplement. No more hypoglycemia  BPH: continue tamsulosin    Blind both eyes    Discharge Exam: Vitals:   04/22/24 2113 04/23/24 0807  BP: (!) 142/70 124/66  Pulse: 92 97  Resp: 16 16  Temp: 98.3 F (36.8 C) 99 F (37.2 C)  SpO2: 99% 100%   General: Pt is alert, awake, not in acute distress Cardiovascular: RRR, S1/S2 +, no rubs, no gallops Respiratory: CTA bilaterally, no wheezing, no rhonchi Abdominal: Soft, NT, ND, bowel sounds + Extremities: no edema, no cyanosis  Discharge Instructions   Allergies as of 04/23/2024       Reactions   Benadryl [diphenhydramine Hcl] Palpitations   2014  Took one tablet and had heart palpitations        Medication List     TAKE these medications    acetaminophen  325 MG tablet Commonly known as: TYLENOL  Take 650 mg by mouth every 8 (eight) hours as needed for mild pain (pain score 1-3).   aspirin  81 MG tablet Take 81 mg by mouth daily.   atorvastatin  20 MG tablet Commonly known as: LIPITOR Take 20 mg by mouth daily.   dutasteride  0.5 MG capsule Commonly known as: AVODART  Take 0.5 mg by mouth daily.   feeding supplement Liqd Take 237 mLs by mouth 2 (two) times daily between meals for 14 days.   ferrous sulfate  325 (65 FE) MG  EC tablet Take 1 tablet (325 mg total) by mouth daily with breakfast.   isosorbide  mononitrate 30 MG 24 hr tablet Commonly known as: IMDUR  Take 1 tablet (30 mg total) by mouth daily.   levothyroxine  88 MCG tablet Commonly known as: SYNTHROID  Take 88 mcg by mouth daily before breakfast.   metoprolol  succinate 25 MG 24 hr tablet Commonly known as: TOPROL -XL Take 1 tablet (25 mg total) by  mouth daily. TAKE WITH OR IMMEDIATELY FOLLOWING A MEAL.   multivitamin tablet Take 1 tablet by mouth daily.   nitroGLYCERIN  0.4 MG SL tablet Commonly known as: NITROSTAT  Place 1 tablet (0.4 mg total) under the tongue every 5 (five) minutes as needed. Must call and schedule appt for future refills   senna-docusate 8.6-50 MG tablet Commonly known as: Senokot-S Take 1 tablet by mouth daily.   tamsulosin  0.4 MG Caps capsule Commonly known as: FLOMAX  Take 0.8 mg by mouth daily.   Vitamin D3 25 MCG (1000 UT) Caps Take 1 capsule by mouth daily.               Durable Medical Equipment  (From admission, onward)           Start     Ordered   04/22/24 1059  For home use only DME Walker rolling  Once       Question Answer Comment  Walker: With 5 Inch Wheels   Patient needs a walker to treat with the following condition Weakness      04/22/24 1058            Follow-up Information     [facility].   Specialty: [facility] Contact information: [address] [place] [address] [place] [address] [phone]         Dustin Leni Edyth DELENA, MD Follow up in 1 week(s).   Specialty: Family Medicine Contact information: [address] [place] [address] [phone]                Allergies  Allergen Reactions   Benadryl [Diphenhydramine Hcl] Palpitations    2014  Took one tablet and had heart palpitations    The results of significant diagnostics from this hospitalization (including imaging, microbiology, ancillary and laboratory) are listed below for reference.    Microbiology: Recent Results (from the past 240 hours)  Culture, blood (Routine x 2)     Status: None   Collection Time: 04/14/24  2:50 PM   Specimen: BLOOD RIGHT HAND  Result Value Ref Range Status   Specimen Description BLOOD RIGHT HAND  Final   Special Requests   Final    AEROBIC BOTTLE ONLY Blood Culture results may not be optimal due to an inadequate volume  of blood received in culture bottles   Culture   Final    NO GROWTH 5 DAYS Performed at [facility], [address] [place], [place] [address]    Report Status 04/19/2024 FINAL  Final  Resp panel by RT-PCR (RSV, Flu A&B, Covid) Anterior Nasal Swab     Status: None   Collection Time: 04/14/24  3:11 PM   Specimen: Anterior Nasal Swab  Result Value Ref Range Status   SARS Coronavirus 2 by RT PCR NEGATIVE NEGATIVE Final   Influenza A by PCR NEGATIVE NEGATIVE Final   Influenza B by PCR NEGATIVE NEGATIVE Final    Comment: (NOTE) The Xpert Xpress SARS-CoV-2/FLU/RSV plus assay is intended as an aid in the diagnosis of influenza from  Nasopharyngeal swab specimens and should not be used as a sole basis for treatment. Nasal washings and aspirates are unacceptable for Xpert Xpress SARS-CoV-2/FLU/RSV testing.  Fact Sheet for Patients: BloggerCourse.com  Fact Sheet for Healthcare Providers: SeriousBroker.it  This test is not yet approved or cleared by the [place]  FDA and has been authorized for detection and/or diagnosis of SARS-CoV-2 by FDA under an Emergency Use Authorization (EUA). This EUA will remain in effect (meaning this test can be used) for the duration of the COVID-19 declaration under Section 564(b)(1) of the Act, 21 U.S.C. section 360bbb-3(b)(1), unless the authorization is terminated or revoked.     Resp Syncytial Virus by PCR NEGATIVE NEGATIVE Final    Comment: (NOTE) Fact Sheet for Patients: BloggerCourse.com  Fact Sheet for Healthcare Providers: SeriousBroker.it  This test is not yet approved or cleared by the [place]  FDA and has been authorized for detection and/or diagnosis of SARS-CoV-2 by FDA under an Emergency Use Authorization (EUA). This EUA will remain in effect (meaning this test can be used) for the duration of the COVID-19 declaration under  Section 564(b)(1) of the Act, 21 U.S.C. section 360bbb-3(b)(1), unless the authorization is terminated or revoked.  Performed at [facility], [address] [place], [place] [address]   Culture, blood (Routine x 2)     Status: None   Collection Time: 04/15/24  4:15 AM   Specimen: BLOOD  Result Value Ref Range Status   Specimen Description BLOOD SITE NOT SPECIFIED  Final   Special Requests   Final    BOTTLES DRAWN AEROBIC AND ANAEROBIC Blood Culture adequate volume   Culture   Final    NO GROWTH 5 DAYS Performed at [facility], [address], [place], [place] [address]    Report Status 04/20/2024 FINAL  Final    Procedures/Studies: ECHOCARDIOGRAM COMPLETE Result Date: 04/15/2024    ECHOCARDIOGRAM REPORT   Patient Name:   KAYVEN ALDACO Date of Exam: 04/15/2024 Medical Rec #:  [id number]              Height:       63.0 in Accession #:    [id number]             Weight:       142.0 lb Date of Birth:  Jan 08, 1946              BSA:          1.672 m Patient Age:    78 years               BP:           152/79 mmHg Patient Gender: M                      HR:           75 bpm. Exam Location:  Inpatient Procedure: 2D Echo, Cardiac Doppler and Color Doppler (Both Spectral and Color            Flow Doppler were utilized during procedure). Indications:    CHF  History:        Patient has prior history of Echocardiogram examinations, most                 recent 10/03/2021. Risk Factors:Hypertension and Dyslipidemia.  Sonographer:    Philomena Daring Referring Phys: [id number] ANASTASSIA DOUTOVA  Sonographer Comments: Technically difficult study due to poor echo windows. Image acquisition challenging due to  patient body habitus. IMPRESSIONS  1. Hyperdynamic LV, not well interrogated but appears to be an intercavitary gradient of around . Left ventricular ejection fraction, by estimation, is 70 to 75%. The left ventricle has hyperdynamic function. The left ventricle has no regional wall motion  abnormalities. Left ventricular diastolic parameters are consistent with Grade I diastolic dysfunction (impaired relaxation).  2. Right ventricular systolic function is normal. The right ventricular size is normal.  3. The mitral valve is normal in structure. No evidence of mitral valve regurgitation. No evidence of mitral stenosis.  4. The aortic valve is normal in structure. Aortic valve regurgitation is not visualized. No aortic stenosis is present.  5. The inferior vena cava is normal in size with greater than 50% respiratory variability, suggesting right atrial pressure of 3 mmHg. FINDINGS  Left Ventricle: Hyperdynamic LV, not well interrogated but appears to be an intercavitary gradient of around . Left ventricular ejection fraction, by estimation, is 70 to 75%. The left ventricle has hyperdynamic function. The left ventricle has no  regional wall motion abnormalities. The left ventricular internal cavity size was normal in size. There is no left ventricular hypertrophy. Left ventricular diastolic parameters are consistent with Grade I diastolic dysfunction (impaired relaxation). Right Ventricle: The right ventricular size is normal. No increase in right ventricular wall thickness. Right ventricular systolic function is normal. Left Atrium: Left atrial size was normal in size. Right Atrium: Right atrial size was normal in size. Pericardium: There is no evidence of pericardial effusion. Mitral Valve: The mitral valve is normal in structure. No evidence of mitral valve regurgitation. No evidence of mitral valve stenosis. Tricuspid Valve: The tricuspid valve is normal in structure. Tricuspid valve regurgitation is not demonstrated. No evidence of tricuspid stenosis. Aortic Valve: The aortic valve is normal in structure. Aortic valve regurgitation is not visualized. No aortic stenosis is present. Pulmonic Valve: The pulmonic valve was normal in structure. Pulmonic valve regurgitation is not visualized. No  evidence of pulmonic stenosis. Aorta: The aortic root is normal in size and structure. Venous: The inferior vena cava is normal in size with greater than 50% respiratory variability, suggesting right atrial pressure of 3 mmHg. IAS/Shunts: No atrial level shunt detected by color flow Doppler.  LEFT VENTRICLE PLAX 2D LVIDd:         4.00 cm   Diastology LVIDs:         2.50 cm   LV e' medial:    4.46 cm/s LV PW:         1.10 cm   LV E/e' medial:  11.9 LV IVS:        0.90 cm   LV e' lateral:   4.90 cm/s LVOT diam:     1.90 cm   LV E/e' lateral: 10.8 LV SV:         82 LV SV Index:   49 LVOT Area:     2.84 cm  RIGHT VENTRICLE             IVC RV S prime:     13.70 cm/s  IVC diam: 1.40 cm TAPSE (M-mode): 1.7 cm LEFT ATRIUM             Index        RIGHT ATRIUM          Index LA diam:        2.90 cm 1.73 cm/m   RA Area:     8.55 cm LA Vol (A2C):   26.0 ml 15.55 ml/m  RA Volume:   16.10 ml 9.63 ml/m LA Vol (A4C):   29.4 ml 17.59 ml/m LA Biplane Vol: 28.9 ml 17.29 ml/m  AORTIC VALVE LVOT Vmax:   135.00 cm/s LVOT Vmean:  90.700 cm/s LVOT VTI:    0.289 m  AORTA Ao Root diam: 2.80 cm MITRAL VALVE MV Area (PHT): 3.77 cm    SHUNTS MV Decel Time: 201 msec    Systemic VTI:  0.29 m MV E velocity: 52.90 cm/s  Systemic Diam: 1.90 cm MV A velocity: 96.90 cm/s MV E/A ratio:  0.55 Morene Brownie Electronically signed by Morene Brownie Signature Date/Time: 04/15/2024/6:09:34 PM    Final    MR ABDOMEN MRCP W WO CONTAST Result Date: 04/15/2024 CLINICAL DATA:  Gallbladder wall thickening/edema and cholelithiasis. Elevated liver function tests. EXAM: MRI ABDOMEN WITHOUT AND WITH CONTRAST (INCLUDING MRCP) TECHNIQUE: Multiplanar multisequence MR imaging of the abdomen was performed both before and after the administration of intravenous contrast. Heavily T2-weighted images of the biliary and pancreatic ducts were obtained, and three-dimensional MRCP images were rendered by post processing. CONTRAST:  6mL GADAVIST  GADOBUTROL  1 MMOL/ML  IV SOLN COMPARISON:  Multiple exams, including CT and ultrasound exams from 04/14/2024 FINDINGS: Despite efforts by the technologist and patient, motion artifact is present on today's exam and could not be eliminated. This reduces exam sensitivity and specificity. Lower chest: Hazy signal posteriorly in the lower lobes, right greater than left, corresponding with the ground-glass opacities on CT scan. Hepatobiliary: Abnormal gallbladder wall thickening with layering small gallstones in the gallbladder. No biliary dilatation or definite filling defect in the common bile duct or common hepatic duct. No significant abnormal focal hepatic lesion is identified. Pancreas:  Unremarkable Spleen:  Unremarkable Adrenals/Urinary Tract: 1.2 cm benign Bosniak category 2 cyst of the right kidney upper pole has a single thin internal septation. Benign Bosniak category 1 cyst of the left kidney upper pole measuring 0.8 cm in diameter on image 20 series 12. Benign 0.7 cm left kidney lower pole Bosniak category 1 cyst. No further imaging workup of these lesions is indicated. Adrenal glands unremarkable. Stomach/Bowel: Prominent stool throughout the colon favors constipation. Vascular/Lymphatic: Atherosclerosis is present, including aortoiliac atherosclerotic disease. 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 Other: Trace nonspecific edema deep to both iliacus muscles in the pelvis. Musculoskeletal: Mild lumbar spondylosis and degenerative disc disease. IMPRESSION: 1. Abnormal gallbladder wall thickening with layering small gallstones in the gallbladder. Acute cholecystitis not excluded. No biliary dilatation or definite filling defect in the common bile duct or common hepatic duct. 2. Prominent stool throughout the colon favors constipation. 3. 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  4. Hazy signal posteriorly in the lower lobes, right greater than left, corresponding with the ground-glass opacities on CT scan. 5. Mild lumbar spondylosis and degenerative disc disease. 6. Trace nonspecific edema deep to both iliacus muscles in the pelvis. Electronically Signed   By: Ryan Salvage M.D.   On: 04/15/2024 16:12   MR 3D Recon At Scanner Result Date: 04/15/2024 CLINICAL DATA:  Gallbladder wall thickening/edema and cholelithiasis. Elevated liver function tests. EXAM: MRI ABDOMEN WITHOUT AND WITH CONTRAST (INCLUDING MRCP) TECHNIQUE: Multiplanar multisequence MR imaging of the abdomen was performed both before and after the administration of intravenous contrast. Heavily T2-weighted images of the biliary and pancreatic ducts were obtained, and three-dimensional MRCP images were rendered by post processing. CONTRAST:  6mL  GADAVIST  GADOBUTROL  1 MMOL/ML IV SOLN COMPARISON:  Multiple exams, including CT and ultrasound exams from 04/14/2024 FINDINGS: Despite efforts by the technologist and patient, motion artifact is present on today's exam and could not be eliminated. This reduces exam sensitivity and specificity. Lower chest: Hazy signal posteriorly in the lower lobes, right greater than left, corresponding with the ground-glass opacities on CT scan. Hepatobiliary: Abnormal gallbladder wall thickening with layering small gallstones in the gallbladder. No biliary dilatation or definite filling defect in the common bile duct or common hepatic duct. No significant abnormal focal hepatic lesion is identified. Pancreas:  Unremarkable Spleen:  Unremarkable Adrenals/Urinary Tract: 1.2 cm benign Bosniak category 2 cyst of the right kidney upper pole has a single thin internal septation. Benign Bosniak category 1 cyst of the left kidney upper pole measuring 0.8 cm in diameter on image 20 series 12. Benign 0.7 cm left kidney lower pole Bosniak category 1 cyst. No further imaging workup of these lesions is  indicated. Adrenal glands unremarkable. Stomach/Bowel: Prominent stool throughout the colon favors constipation. Vascular/Lymphatic: Atherosclerosis is present, including aortoiliac atherosclerotic disease. 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 Other: Trace nonspecific edema deep to both iliacus muscles in the pelvis. Musculoskeletal: Mild lumbar spondylosis and degenerative disc disease. IMPRESSION: 1. Abnormal gallbladder wall thickening with layering small gallstones in the gallbladder. Acute cholecystitis not excluded. No biliary dilatation or definite filling defect in the common bile duct or common hepatic duct. 2. Prominent stool throughout the colon favors constipation. 3. 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 4. Hazy signal posteriorly in the lower lobes, right greater than left, corresponding with the ground-glass opacities on CT scan. 5. Mild lumbar spondylosis and degenerative disc disease. 6. Trace nonspecific edema deep to both iliacus muscles in the pelvis. Electronically Signed   By: Ryan Salvage M.D.   On: 04/15/2024 16:12   US  Abdomen Limited RUQ (LIVER/GB) Result Date: 04/14/2024 CLINICAL DATA:  Cholecystitis EXAM: ULTRASOUND ABDOMEN LIMITED RIGHT UPPER QUADRANT COMPARISON:  Ultrasound 03/29/2024.  CT 04/14/2024 earlier FINDINGS: Gallbladder: Dilated gallbladder with significant wall thickening, new from previous. Dependent stones and tumefactive sludge. No reported Murphy's sign reported however the patient was not awake. Wall thickness approaches 7 mm. Common bile duct: Diameter: 2 mm Liver: No focal lesion identified. Within normal limits in parenchymal echogenicity. Portal vein is patent on color Doppler imaging with normal direction of blood flow towards the liver. Other: None. IMPRESSION: New gallbladder wall thickening and  edema. Stones and sludge identified. Please correlate for other clinical findings of acute cholecystitis. Confirmatory HIDA scan could be considered as clinically appropriate. No biliary ductal dilatation. Electronically Signed   By: Ranell Bring M.D.   On: 04/14/2024 18:23   CT ABDOMEN PELVIS W CONTRAST Result Date: 04/14/2024 CLINICAL DATA:  Fever. Fatigue. Previous history of recent hospital stay for sepsis and dehydration. Abnormal liver function tests EXAM: CT ABDOMEN AND PELVIS WITH CONTRAST TECHNIQUE: Multidetector CT imaging of the abdomen and pelvis was performed using the standard protocol following bolus administration of intravenous contrast. RADIATION DOSE REDUCTION: This exam was performed according to the departmental dose-optimization program which includes automated exposure control, adjustment of the mA and/or kV according to patient size and/or use of iterative reconstruction technique. CONTRAST:  65mL OMNIPAQUE  IOHEXOL  350 MG/ML SOLN COMPARISON:  Ultrasound abdomen 03/29/2024. Renal stone CT 03/28/2024. Recent swallowing study of 04/05/2024 FINDINGS: Lower  chest: Mild opacity along the lung bases. Acute infiltrate is possible. Recommend follow-up. No pleural effusion. Breathing motion. Scattered vascular calcifications including along the coronary arteries. Hepatobiliary: Dilated gallbladder with wall thickening and wall edema. Stones are identified. Please correlate for other clinical evidence of acute cholecystitis. No space-occupying liver lesion. Patent portal vein. Pancreas: Mild global atrophy of the pancreas. No obvious enhancing mass. Spleen: Spleen is nonenlarged. Adrenals/Urinary Tract: Minimal thickening of the lateral limb of the right adrenal gland, nonspecific. Left adrenal glands preserved. Global bilateral renal atrophy. Upper pole Bosniak 1 right-sided small renal cyst. There are bilateral nonobstructing renal stones. These were seen previously. Example on the right measures  10 mm and left 8 mm. Other foci identified as well. Ureters have normal course and caliber extending down to the urinary bladder. Preserved contour to the urinary bladder. Slightly prominent prostate. Stomach/Bowel: There is contrast seen throughout the colon, likely did to previous swallowing study but please correlate with history. Large bowel is nondilated. Scattered colonic stool. Normal retrocecal appendix. The stomach has some luminal fluid. Small bowel is nondilated. Vascular/Lymphatic: Normal caliber IVC. Diffuse vascular calcifications identified including along the aorta and iliac vessels. 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 Slightly saccular in appearance. Second smaller area more caudal as well with what may be a non flow-limiting short segment dissection. Please correlate with history. Simple attention on follow-up. Reproductive: Heterogeneous enlarged prostate. Please correlate with the BPH changes and patient's PSA. Other: No free air or free fluid. Musculoskeletal: Scattered degenerative changes. Multilevel Schmorl's node deformities. Posterior multilevel disc bulging and posterior osteophytes. IMPRESSION: New mild areas of opacity along the lung bases, right greater left. Subtle infiltrate is possible. Recommend follow-up. Dilated gallbladder with slight wall thickening and some wall edema, new from previous. There are stones as well. Please correlate for any clinical evidence of acute cholecystitis and if needed additional workup such as HIDA scan. No bowel obstruction. Contrast seen along the course of the colon. Normal appendix. Bilateral nonobstructing renal stones. Mild ectasia of the abdominal aorta with some saccular areas. Simple follow up surveillance. Electronically Signed   By: Ranell Bring M.D.   On: 04/14/2024 17:28   DG Chest 2 View Result Date: 04/14/2024 CLINICAL DATA:  Sepsis. EXAM: CHEST - 2 VIEW COMPARISON:  X-ray 03/28/2024.  FINDINGS: Underinflation. There is some linear opacity lung bases likely scar or atelectasis. No consolidation, pneumothorax or effusion. No edema. Normal cardiopericardial silhouette. Calcified aorta. Degenerative changes along the spine. There are loops of bowel in the abdomen which have some contrast. Please correlate with known history and prior fluoroscopy. IMPRESSION: Underinflation.  Basilar atelectasis. Electronically Signed   By: Ranell Bring M.D.   On: 04/14/2024 15:25   DG Swallowing Func-Speech Pathology Result Date: 04/05/2024 Table formatting from the original result was not included. Modified Barium Swallow Study Patient Details Name: FORD PEDDIE MRN: [id number] Date of Birth: 1946/03/12 Today's Date: 04/05/2024 HPI/PMH: HPI: Dustin Hess is a 78 y.o. male who was brought to the ER after patient was found to be increasingly weak, not eating well, lethargic and confused. Dx acute metabolic encephalopathy, AKI, hypernatremia. PMHxCAD status post PCI in 2005, hypertension, hypothyroidism, blindness, anemia, BPH. Clinical Impression: Clinical Impression: Pt presents with improvements related to oropharyngeal function, but continues to exhibit moderate dysphagia primarily due to the amount of pharyngeal residue. His oral phase is much more timely and swallow initiation is consistently triggered at the pyrifrom sinuses.  Epiglottic inversion is improved but is not complete. This is expected to be chronic given anterior curvature of pt's cervical spine; however, the improvement from absent to partial results in better airway protection. No penetration/aspiration occurred with any consistencies trialed today. The amount of residue with liquids appears generally unchanged since previous MBS 6/30 but has decreased with solids due to more complete epiglottic inversion with heavier boluses. Recommend upgrading to Dys 2 solids with thin liquids and ongoing dysphagia intervention with SLP at next  venue of care. Discussed with pt, RN, and MD. SLP will f/u. DIGEST Swallow Severity Rating*  Safety: 0  Efficiency: 3  Overall Pharyngeal Swallow Severity: 2 (moderate) 1: mild; 2: moderate; 3: severe; 4: profound *The Dynamic Imaging Grade of Swallowing Toxicity is standardized for the head and neck cancer population, however, demonstrates promising clinical applications across populations to standardize the clinical rating of pharyngeal swallow safety and severity. Factors that may increase risk of adverse event in presence of aspiration [name] & [name] 2021): Factors that may increase risk of adverse event in presence of aspiration [name] & [name] 2021): Poor general health and/or compromised immunity; Frail or deconditioned Recommendations/Plan: Swallowing Evaluation Recommendations Swallowing Evaluation Recommendations Recommendations: PO diet PO Diet Recommendation: Dysphagia 2 (Finely chopped); Thin liquids (Level 0) Liquid Administration via: Cup; Straw Medication Administration: Crushed with puree Supervision: Full assist for feeding; Full supervision/cueing for swallowing strategies Swallowing strategies  : Minimize environmental distractions; Slow rate; Small bites/sips; Multiple dry swallows after each bite/sip; Follow solids with liquids Postural changes: Position pt fully upright for meals; Stay upright 30-60 min after meals Oral care recommendations: Oral care BID (2x/day) Treatment Plan Treatment Plan Treatment recommendations: Therapy as outlined in treatment plan below Follow-up recommendations: Skilled nursing-short term rehab (<3 hours/day) Functional status assessment: Patient has had a recent decline in their functional status and demonstrates the ability to make significant improvements in function in a reasonable and predictable amount of time. Treatment frequency: Min 2x/week Treatment duration: 2 weeks Interventions: Aspiration precaution training; Oropharyngeal exercises;  Patient/family education; Trials of upgraded texture/liquids; Diet toleration management by SLP Recommendations Recommendations for follow up therapy are one component of a multi-disciplinary discharge planning process, led by the attending physician.  Recommendations may be updated based on patient status, additional functional criteria and insurance authorization. Assessment: Orofacial Exam: Orofacial Exam Oral Cavity: Oral Hygiene: WFL Oral Cavity - Dentition: Poor condition; Missing dentition Orofacial Anatomy: WFL Oral Motor/Sensory Function: WFL Anatomy: Anatomy: Suspected cervical osteophytes Boluses Administered: Boluses Administered Boluses Administered: Thin liquids (Level 0); Mildly thick liquids (Level 2, nectar thick); Moderately thick liquids (Level 3, honey thick); Puree; Solid  Oral Impairment Domain: Oral Impairment Domain Lip Closure: Interlabial escape, no progression to anterior lip Tongue control during bolus hold: Cohesive bolus between tongue to palatal seal Bolus preparation/mastication: Timely and efficient chewing and mashing Bolus transport/lingual motion: Brisk tongue motion Oral residue: Trace residue lining oral structures Location of oral residue : Tongue; Palate Initiation of pharyngeal swallow : Pyriform sinuses  Pharyngeal Impairment Domain: Pharyngeal Impairment Domain Soft palate elevation: No bolus between soft palate (SP)/pharyngeal wall (PW) Laryngeal elevation: Complete superior movement of thyroid  cartilage with complete approximation of arytenoids to epiglottic petiole Anterior hyoid excursion: Complete anterior movement Epiglottic movement: Partial inversion Laryngeal vestibule closure: Complete, no air/contrast in laryngeal vestibule Pharyngeal stripping wave : Present - diminished Pharyngeal contraction (A/P view only): N/A Pharyngoesophageal segment opening: Partial distention/partial duration, partial obstruction of flow Tongue base retraction: Narrow column of contrast  or air between  tongue base and PPW Pharyngeal residue: Collection of residue within or on pharyngeal structures Location of pharyngeal residue: Tongue base; Valleculae; Pyriform sinuses  Esophageal Impairment Domain: No data recorded Pill: No data recorded Penetration/Aspiration Scale Score: Penetration/Aspiration Scale Score 1.  Material does not enter airway: Thin liquids (Level 0); Mildly thick liquids (Level 2, nectar thick); Moderately thick liquids (Level 3, honey thick); Puree; Solid Compensatory Strategies: Compensatory Strategies Compensatory strategies: No   General Information: Caregiver present: No  Diet Prior to this Study: Dysphagia 1 (pureed); Mildly thick liquids (Level 2, nectar thick)   Temperature : Normal   Respiratory Status: WFL   Supplemental O2: None (Room air)   History of Recent Intubation: No  Behavior/Cognition: Alert; Cooperative Self-Feeding Abilities: Dependent for feeding Baseline vocal quality/speech: Normal Volitional Cough: Able to elicit Volitional Swallow: Able to elicit Exam Limitations: No limitations Goal Planning: Prognosis for improved oropharyngeal function: Fair Barriers to Reach Goals: Time post onset; Severity of deficits No data recorded Patient/Family Stated Goal: none stated Consulted and agree with results and recommendations: Patient Pain: Pain Assessment Pain Assessment: No/denies pain End of Session: Start Time:SLP Start Time (ACUTE ONLY): 1224 Stop Time: SLP Stop Time (ACUTE ONLY): 1239 Time Calculation:SLP Time Calculation (min) (ACUTE ONLY): 15 min Charges: SLP Evaluations $ SLP Speech Visit: 1 Visit SLP Evaluations $MBS Swallow: 1 Procedure $Swallowing Treatment: 1 Procedure SLP visit diagnosis: SLP Visit Diagnosis: Dysphagia, oropharyngeal phase (R13.12) Past Medical History: Past Medical History: Diagnosis Date  Anemia, unspecified 12/22/2013  Arthritis   Benign prostatic hypertrophy   Congenital blindness   Coronary artery disease   a. 06/2004 Inf STEMI with  PCI/BMS to RCA/LCX;  b. Cath 2014 40-50% focal bradycardia circ stenosis with a patent stent, RCA 50% stenosis, distal 60% stenosis in a previously stented area. 06/2011 Neg MV, EF 67%.  Dyslipidemia   GERD (gastroesophageal reflux disease)   GI bleeding 2006  Hypertension   Hypothyroidism   Leukopenia 12/22/2013 Past Surgical History: Past Surgical History: Procedure Laterality Date  COLONOSCOPY    CORONARY ANGIOPLASTY WITH STENT PLACEMENT    ESOPHAGOGASTRODUODENOSCOPY    HEMORRHOID SURGERY    LEFT HEART CATHETERIZATION WITH CORONARY ANGIOGRAM N/A 04/30/2013  Procedure: LEFT HEART CATHETERIZATION WITH CORONARY ANGIOGRAM;  Surgeon: Ozell Fell, MD;  Location: [facility] CATH LAB;  Service: Cardiovascular;  Laterality: N/A; Damien Blumenthal, M.A., CCC-SLP Speech Language Pathology, Acute Rehabilitation Services Secure Chat preferred [phone] 04/05/2024, 12:59 PM  MR BRAIN WO CONTRAST Result Date: 03/30/2024 CLINICAL DATA:  Mental status change. EXAM: MRI HEAD WITHOUT CONTRAST TECHNIQUE: Multiplanar, multiecho pulse sequences of the brain and surrounding structures were obtained without intravenous contrast. COMPARISON:  CT head 03/28/2024. FINDINGS: Brain: No acute infarct. No evidence of intracranial hemorrhage. T2/FLAIR hyperintensity in the periventricular and subcortical white matter. Generalized parenchymal volume loss. There is mild prominence of extra-axial spaces over the cerebral convexities without evidence of extra-axial fluid collection. No edema, mass effect, or midline shift. Posterior fossa is unremarkable. Normal appearance of midline structures. The basilar cisterns are patent. Ventricles: Normal size and configuration of the ventricles. Vascular: Skull base flow voids are visualized. Skull and upper cervical spine: No focal abnormality. Sinuses/Orbits: Ocular prostheses. Mucous retention cyst in the right maxillary sinus. Other: Mastoid air cells are clear. IMPRESSION: No acute intracranial abnormality. Mild  chronic microvascular ischemic changes and mild parenchymal volume loss. Electronically Signed   By: Donnice Mania M.D.   On: 03/30/2024 17:10   DG Swallowing Func-Speech Pathology Result Date: 03/29/2024 Table formatting from the original  result was not included. Modified Barium Swallow Study Patient Details Name: KROSS SWALLOWS MRN: [id number] Date of Birth: 02/14/1946 Today's Date: 03/29/2024 HPI/PMH: HPI: Dustin Hess is a 78 y.o. male with history of CAD status post PCI in 2005, hypertension, hypothyroidism, blindness, anemia, BPH was brought to the ER after patient was found to be increasingly weak, not eating well, lethargic with some confusion for the last 2 to 3 days.  History was provided by patient's wife.  Per wife patient did not have any nausea vomiting or diarrhea did not complain of any chest pain or abdominal pain.  Was refusing to eat because of poor appetite and became more weak, tired, and lethargic. Clinical Impression: Clinical Impression: Pt exhibits moderate oropharyngeal dysphagia. This appears to primarily be a result of posture as his neck remains in a hyperextended position with limited mobility, which greatly affected his ability to try compensatory strategies. Due to the anterior curvature of his cervical spine, base of tongue retraction and epiglottic inversion are nearly absent. This results in retention of the majority of each bolus along the base of tongue, valleculae, and posterior pharyngeal wall. With consecutive sips of thin liquids, the bolus reaches his vocal folds without sensation (PAS 5). When cued to cough, it was expelled. Nectar thick liquids allow slightly improved epiglottic deflection and thus, better airway protection. Even when given via consecutive straw sips, nectar thick liquids are transiently penetrated (PAS 2, considered WFL). While purees do not enter the laryngeal vestibule, there is minimal clearance from the valleculae which is only  slightly improved with a liquid wash and subswallows. Overall, there is minimal pharyngeal space for each bolus to flow through the pharynx and PES. Brief oral holding with tremulous swallow initiation was also observed. Recommend full nectar thick liquid diet with consideration of meds given via alternative means. He will require assistance with feeding as well as cueing to swallow multiple times and use a liquid wash. SLP will f/u. DIGEST Swallow Severity Rating*  Safety:  1  Efficiency: 3  Overall Pharyngeal Swallow Severity: 2 (moderate) 1: mild; 2: moderate; 3: severe; 4: profound *The Dynamic Imaging Grade of Swallowing Toxicity is standardized for the head and neck cancer population, however, demonstrates promising clinical applications across populations to standardize the clinical rating of pharyngeal swallow safety and severity.  Factors that may increase risk of adverse event in presence of aspiration [name] & [name] 2021): Factors that may increase risk of adverse event in presence of aspiration [name] & [name] 2021): Poor general health and/or compromised immunity; Reduced cognitive function; Limited mobility; Frail or deconditioned; Dependence for feeding and/or oral hygiene Recommendations/Plan: Swallowing Evaluation Recommendations Swallowing Evaluation Recommendations Recommendations: PO diet PO Diet Recommendation: Mildly thick liquids (Level 2, nectar thick); Full liquid diet Liquid Administration via: Spoon; Cup; Straw Medication Administration: Via alternative means Supervision: Full assist for feeding; Full supervision/cueing for swallowing strategies Swallowing strategies  : Minimize environmental distractions; Slow rate; Small bites/sips; Multiple dry swallows after each bite/sip; Follow solids with liquids Postural changes: Position pt fully upright for meals; Stay upright 30-60 min after meals Oral care recommendations: Oral care BID (2x/day) Caregiver Recommendations: Avoid jello, ice  cream, thin soups, popsicles; Remove water pitcher Treatment Plan Treatment Plan Treatment recommendations: Therapy as outlined in treatment plan below Follow-up recommendations: Home health SLP Functional status assessment: Patient has had a recent decline in their functional status and demonstrates the ability to make significant improvements in function in a reasonable and predictable amount of time. Treatment  frequency: Min 2x/week Treatment duration: 2 weeks Interventions: Aspiration precaution training; Oropharyngeal exercises; Patient/family education; Trials of upgraded texture/liquids; Diet toleration management by SLP Recommendations Recommendations for follow up therapy are one component of a multi-disciplinary discharge planning process, led by the attending physician.  Recommendations may be updated based on patient status, additional functional criteria and insurance authorization. Assessment: Orofacial Exam: Orofacial Exam Oral Cavity: Oral Hygiene: WFL Oral Cavity - Dentition: Poor condition; Missing dentition Orofacial Anatomy: WFL Oral Motor/Sensory Function: WFL Anatomy: Anatomy: Suspected cervical osteophytes Boluses Administered: Boluses Administered Boluses Administered: Thin liquids (Level 0); Mildly thick liquids (Level 2, nectar thick); Puree  Oral Impairment Domain: Oral Impairment Domain Lip Closure: Escape from interlabial space or lateral juncture, no extension beyond vermillion border Tongue control during bolus hold: Posterior escape of greater than half of bolus Bolus preparation/mastication: Slow prolonged chewing/mashing with complete recollection Bolus transport/lingual motion: Delayed initiation of tongue motion (oral holding) Oral residue: Residue collection on oral structures Location of oral residue : Tongue; Palate Initiation of pharyngeal swallow : Pyriform sinuses  Pharyngeal Impairment Domain: Pharyngeal Impairment Domain Soft palate elevation: No bolus between soft palate  (SP)/pharyngeal wall (PW) Laryngeal elevation: Complete superior movement of thyroid  cartilage with complete approximation of arytenoids to epiglottic petiole Anterior hyoid excursion: Complete anterior movement Epiglottic movement: No inversion Laryngeal vestibule closure: Incomplete, narrow column air/contrast in laryngeal vestibule Pharyngeal stripping wave : Present - diminished Pharyngeal contraction (A/P view only): N/A Pharyngoesophageal segment opening: Minimal distention/minimal duration, marked obstruction of flow Tongue base retraction: Wide column of contrast or air between tongue base and PPW Pharyngeal residue: Majority of contrast within or on pharyngeal structures Location of pharyngeal residue: Tongue base; Valleculae; Pharyngeal wall  Esophageal Impairment Domain: No data recorded Pill: No data recorded Penetration/Aspiration Scale Score: Penetration/Aspiration Scale Score 1.  Material does not enter airway: Puree 2.  Material enters airway, remains ABOVE vocal cords then ejected out: Mildly thick liquids (Level 2, nectar thick) 5.  Material enters airway, CONTACTS cords and not ejected out: Thin liquids (Level 0) Compensatory Strategies: Compensatory Strategies Compensatory strategies: No   General Information: Caregiver present: No  Diet Prior to this Study: NPO   Temperature : Normal   Respiratory Status: WFL   Supplemental O2: Nasal cannula   History of Recent Intubation: No  Behavior/Cognition: Requires cueing; Lethargic/Drowsy; Doesn't follow directions Self-Feeding Abilities: Dependent for feeding Baseline vocal quality/speech: Normal Volitional Cough: Able to elicit Volitional Swallow: Able to elicit Exam Limitations: No limitations Goal Planning: Prognosis for improved oropharyngeal function: Fair Barriers to Reach Goals: Cognitive deficits; Time post onset; Severity of deficits No data recorded Patient/Family Stated Goal: none stated Consulted and agree with results and recommendations:  Pt unable/family or caregiver not available Pain: Pain Assessment Pain Assessment: Faces Faces Pain Scale: 0 Pain Intervention(s): Limited activity within patient's tolerance End of Session: Start Time:SLP Start Time (ACUTE ONLY): 1453 Stop Time: SLP Stop Time (ACUTE ONLY): 1517 Time Calculation:SLP Time Calculation (min) (ACUTE ONLY): 24 min Charges: SLP Evaluations $ SLP Speech Visit: 1 Visit SLP Evaluations $BSS Swallow: 1 Procedure $MBS Swallow: 1 Procedure SLP visit diagnosis: SLP Visit Diagnosis: Dysphagia, oropharyngeal phase (R13.12) Past Medical History: Past Medical History: Diagnosis Date  Anemia, unspecified 12/22/2013  Arthritis   Benign prostatic hypertrophy   Congenital blindness   Coronary artery disease   a. 06/2004 Inf STEMI with PCI/BMS to RCA/LCX;  b. Cath 2014 40-50% focal bradycardia circ stenosis with a patent stent, RCA 50% stenosis, distal 60% stenosis in a previously  stented area. 06/2011 Neg MV, EF 67%.  Dyslipidemia   GERD (gastroesophageal reflux disease)   GI bleeding 2006  Hypertension   Hypothyroidism   Leukopenia 12/22/2013 Past Surgical History: Past Surgical History: Procedure Laterality Date  COLONOSCOPY    CORONARY ANGIOPLASTY WITH STENT PLACEMENT    ESOPHAGOGASTRODUODENOSCOPY    HEMORRHOID SURGERY    LEFT HEART CATHETERIZATION WITH CORONARY ANGIOGRAM N/A 04/30/2013  Procedure: LEFT HEART CATHETERIZATION WITH CORONARY ANGIOGRAM;  Surgeon: Ozell Fell, MD;  Location: [facility] CATH LAB;  Service: Cardiovascular;  Laterality: N/A; Damien Blumenthal, M.A., CCC-SLP Speech Language Pathology, Acute Rehabilitation Services Secure Chat preferred [phone] 03/29/2024, 4:39 PM  US  Abdomen Limited RUQ (LIVER/GB) Result Date: 03/29/2024 CLINICAL DATA:  78 year old male with acute renal failure. Nausea vomiting. EXAM: ULTRASOUND ABDOMEN LIMITED RIGHT UPPER QUADRANT COMPARISON:  Noncontrast CT Abdomen and Pelvis yesterday. FINDINGS: Gallbladder: Layering gravel type stones as seen by CT yesterday.  Superimposed dependent sludge within the gallbladder (image 3). Gallbladder wall thickness remains within normal limits. No pericholecystic fluid. No sonographic Murphy sign described. Common bile duct: Diameter: 6-7 mm, upper limits of normal. No filling defect within the visible duct. Liver: No intrahepatic biliary ductal dilatation identified. Liver echogenicity within normal limits. No discrete liver lesion. Portal vein is patent on color Doppler imaging with normal direction of blood flow towards the liver. Other: Echogenic right kidney (image 44) compatible with medical renal disease. No free fluid identified. IMPRESSION: 1. Cholelithiasis and gallbladder sludge but no strong imaging evidence of acute cholecystitis. 2. Common bile duct diameter at the upper limits of normal. No intrahepatic ductal dilatation to strongly suggest choledocholithiasis. 3. Echogenic right kidney compatible with medical renal disease. Electronically Signed   By: VEAR Hurst M.D.   On: 03/29/2024 07:24   CT RENAL STONE STUDY Result Date: 03/28/2024 CLINICAL DATA:  Acute renal failure EXAM: CT ABDOMEN AND PELVIS WITHOUT CONTRAST TECHNIQUE: Multidetector CT imaging of the abdomen and pelvis was performed following the standard protocol without IV contrast. RADIATION DOSE REDUCTION: This exam was performed according to the departmental dose-optimization program which includes automated exposure control, adjustment of the mA and/or kV according to patient size and/or use of iterative reconstruction technique. COMPARISON:  Remote CT 09/18/2005 FINDINGS: Lower chest: Normal heart size with coronary artery calcifications. Subsegmental right lower lobe atelectasis. Hepatobiliary: Motion artifact limitations. No evidence of focal liver abnormality on this unenhanced exam. Small gallstones layering in the gallbladder. No pericholecystic inflammation. No biliary dilatation. Pancreas: No ductal dilatation or inflammation. Spleen: Normal in size  without focal abnormality. Adrenals/Urinary Tract: No adrenal nodule. No hydronephrosis. There are bilateral intrarenal calculi. No perinephric inflammation. No evidence of focal renal abnormality. Decompressed ureters. Partially distended urinary bladder, no bladder wall thickening. Stomach/Bowel: Decompressed stomach. No small bowel obstruction or inflammation. Normal appendix. Moderate volume of stool in the colon. Mild left colonic diverticulosis. No diverticulitis. No colonic inflammation. Vascular/Lymphatic: Advanced aortic and branch atherosclerosis. No aortic aneurysm. No adenopathy. Reproductive: Prostate is unremarkable. Other: No free air or ascites.  No abdominal wall hernia. Musculoskeletal: There are no acute or suspicious osseous abnormalities. Schmorl's nodes throughout the lumbar spine IMPRESSION: 1. No acute abnormality in the abdomen/pelvis. 2. Bilateral nonobstructing intrarenal calculi. 3. Cholelithiasis without cholecystitis. 4. Mild left colonic diverticulosis without diverticulitis. Aortic Atherosclerosis (ICD10-I70.0). Electronically Signed   By: Andrea Gasman M.D.   On: 03/28/2024 23:07   CT Head Wo Contrast Result Date: 03/28/2024 CLINICAL DATA:  Altered mental status. EXAM: CT HEAD WITHOUT CONTRAST TECHNIQUE: Contiguous axial images were obtained  from the base of the skull through the vertex without intravenous contrast. RADIATION DOSE REDUCTION: This exam was performed according to the departmental dose-optimization program which includes automated exposure control, adjustment of the mA and/or kV according to patient size and/or use of iterative reconstruction technique. COMPARISON:  August 28, 2023 FINDINGS: Brain: There is generalized cerebral atrophy with widening of the extra-axial spaces and ventricular dilatation. There are areas of decreased attenuation within the white matter tracts of the supratentorial brain, consistent with microvascular disease changes. Vascular: No  hyperdense vessel or unexpected calcification. Skull: Normal. Negative for fracture or focal lesion. Sinuses/Orbits: A 1.9 cm right maxillary sinus polyp versus mucous retention cyst is seen. Postoperative changes are seen involving both globes. Other: None. IMPRESSION: 1. Generalized cerebral atrophy with chronic white matter small vessel ischemic changes. 2. No acute intracranial abnormality. 3. Right maxillary sinus polyp versus mucous retention cyst. Electronically Signed   By: Suzen Dials M.D.   On: 03/28/2024 17:49   DG Chest Portable 1 View Result Date: 03/28/2024 CLINICAL DATA:  Altered mental status, chills, fatigue and cough. EXAM: PORTABLE CHEST 1 VIEW COMPARISON:  August 28, 2023 FINDINGS: Limited study secondary to patient rotation. The heart size and mediastinal contours are within normal limits. Both lungs are clear. The visualized skeletal structures are unremarkable. IMPRESSION: No active disease. Electronically Signed   By: Suzen Dials M.D.   On: 03/28/2024 16:19    Labs: BNP (last 3 results) Recent Labs    08/28/23 1744  BNP 297.9*   Basic Metabolic Panel: Recent Labs  Lab 04/17/24 0415 04/18/24 0756 04/21/24 0536  NA 140 140 140  K 3.4* 3.9 4.2  CL 107 106 104  CO2 25 24 26   GLUCOSE 98 106* 96  BUN 10 10 12   CREATININE 0.83 0.87 1.08  CALCIUM  9.2 9.3 9.7  MG 1.7 1.9 2.2   Liver Function Tests: Recent Labs  Lab 04/17/24 0415 04/18/24 0756 04/21/24 0536  AST 81* 67* 41  ALT 169* 138* 92*  ALKPHOS 130* 123 107  BILITOT 2.0* 1.3* 1.1  PROT 6.7 6.6 6.8  ALBUMIN 3.0* 3.0* 3.2*   No results for input(s): LIPASE, AMYLASE in the last 168 hours. No results for input(s): AMMONIA in the last 168 hours. CBC: Recent Labs  Lab 04/17/24 0415 04/18/24 0756 04/21/24 0536  WBC 4.9 3.7* 4.6  HGB 13.0 12.8* 13.9  HCT 38.6* 38.2* 42.4  MCV 93.0 93.4 94.2  PLT 129* 129* 122*   CBG: Recent Labs  Lab 04/22/24 2115 04/23/24 0007 04/23/24 0413  04/23/24 0806 04/23/24 1158  GLUCAP 143* 123* 97 132* 100*  Urinalysis    Component Value Date/Time   COLORURINE AMBER (A) 04/15/2024 0257   APPEARANCEUR CLEAR 04/15/2024 0257   LABSPEC >1.046 (H) 04/15/2024 0257   PHURINE 5.0 04/15/2024 0257   GLUCOSEU NEGATIVE 04/15/2024 0257   HGBUR NEGATIVE 04/15/2024 0257   BILIRUBINUR SMALL (A) 04/15/2024 0257   KETONESUR NEGATIVE 04/15/2024 0257   PROTEINUR 100 (A) 04/15/2024 0257   UROBILINOGEN 1.0 12/28/2007 0801   NITRITE NEGATIVE 04/15/2024 0257   LEUKOCYTESUR NEGATIVE 04/15/2024 0257   Sepsis Labs Recent Labs  Lab 04/17/24 0415 04/18/24 0756 04/21/24 0536  WBC 4.9 3.7* 4.6   Microbiology Recent Results (from the past 240 hours)  Culture, blood (Routine x 2)     Status: None   Collection Time: 04/14/24  2:50 PM   Specimen: BLOOD RIGHT HAND  Result Value Ref Range Status   Specimen Description BLOOD RIGHT  HAND  Final   Special Requests   Final    AEROBIC BOTTLE ONLY Blood Culture results may not be optimal due to an inadequate volume of blood received in culture bottles   Culture   Final    NO GROWTH 5 DAYS Performed at [facility], [address], [place], [place] [address]    Report Status 04/19/2024 FINAL  Final  Resp panel by RT-PCR (RSV, Flu A&B, Covid) Anterior Nasal Swab     Status: None   Collection Time: 04/14/24  3:11 PM   Specimen: Anterior Nasal Swab  Result Value Ref Range Status   SARS Coronavirus 2 by RT PCR NEGATIVE NEGATIVE Final   Influenza A by PCR NEGATIVE NEGATIVE Final   Influenza B by PCR NEGATIVE NEGATIVE Final    Comment: (NOTE) The Xpert Xpress SARS-CoV-2/FLU/RSV plus assay is intended as an aid in the diagnosis of influenza from Nasopharyngeal swab specimens and should not be used as a sole basis for treatment. Nasal washings and aspirates are unacceptable for Xpert Xpress SARS-CoV-2/FLU/RSV testing.  Fact Sheet for Patients: BloggerCourse.com  Fact Sheet  for Healthcare Providers: SeriousBroker.it  This test is not yet approved or cleared by the [place]  FDA and has been authorized for detection and/or diagnosis of SARS-CoV-2 by FDA under an Emergency Use Authorization (EUA). This EUA will remain in effect (meaning this test can be used) for the duration of the COVID-19 declaration under Section 564(b)(1) of the Act, 21 U.S.C. section 360bbb-3(b)(1), unless the authorization is terminated or revoked.     Resp Syncytial Virus by PCR NEGATIVE NEGATIVE Final    Comment: (NOTE) Fact Sheet for Patients: BloggerCourse.com  Fact Sheet for Healthcare Providers: SeriousBroker.it  This test is not yet approved or cleared by the [place]  FDA and has been authorized for detection and/or diagnosis of SARS-CoV-2 by FDA under an Emergency Use Authorization (EUA). This EUA will remain in effect (meaning this test can be used) for the duration of the COVID-19 declaration under Section 564(b)(1) of the Act, 21 U.S.C. section 360bbb-3(b)(1), unless the authorization is terminated or revoked.  Performed at [facility], [address] [place], [place] [address]   Culture, blood (Routine x 2)     Status: None   Collection Time: 04/15/24  4:15 AM   Specimen: BLOOD  Result Value Ref Range Status   Specimen Description BLOOD SITE NOT SPECIFIED  Final   Special Requests   Final    BOTTLES DRAWN AEROBIC AND ANAEROBIC Blood Culture adequate volume   Culture   Final    NO GROWTH 5 DAYS Performed at [facility], [address], [place], [place] [address]    Report Status 04/20/2024 FINAL  Final   Time coordinating discharge:  35  minutes  SIGNED: Mennie LAMY, MD  [facility] 04/23/2024, 2:37 PM  If 7PM-7AM, please contact night-coverage [email or web address]

## 2024-04-23 NOTE — Plan of Care (Signed)
  Problem: Health Behavior/Discharge Planning: Goal: Ability to manage health-related needs will improve 04/23/2024 0407 by Patrcia Nordmann D, LPN Outcome: Progressing 04/23/2024 0406 by Patrcia Nordmann D, LPN Outcome: Progressing   Problem: Coping: Goal: Ability to adjust to condition or change in health will improve 04/23/2024 0407 by Patrcia Nordmann D, LPN Outcome: Adequate for Discharge 04/23/2024 0406 by Patrcia Nordmann BIRCH, LPN Outcome: Adequate for Discharge   Problem: Fluid Volume: Goal: Ability to maintain a balanced intake and output will improve 04/23/2024 0407 by Patrcia Nordmann D, LPN Outcome: Adequate for Discharge 04/23/2024 0406 by Patrcia Nordmann D, LPN Outcome: Adequate for Discharge   Problem: Nutritional: Goal: Maintenance of adequate nutrition will improve 04/23/2024 0407 by Patrcia Nordmann D, LPN Outcome: Adequate for Discharge 04/23/2024 0406 by Patrcia Nordmann D, LPN Outcome: Adequate for Discharge   Problem: Skin Integrity: Goal: Risk for impaired skin integrity will decrease 04/23/2024 0407 by Patrcia Nordmann D, LPN Outcome: Adequate for Discharge 04/23/2024 0406 by Patrcia Nordmann BIRCH, LPN Outcome: Adequate for Discharge

## 2024-04-23 NOTE — Progress Notes (Signed)
 Physical Therapy Treatment Patient Details Name: Dustin Hess MRN: [id number] DOB: [birth year] Today's Date: 04/23/2024   History of Present Illness 78 year old male who comes in from [place] with increased fatigue, low-grade fever. CT scan of the abdomen and pelvis on admission showed possible pulmonary infiltrates as well as dilated gallbladder with some wall edema. history of CAD with PCI in 2005, HTN, hypothyroidism, blindness.    PT Comments  Pt continues to be slow and guarded d/t visual deficits.  He requires max tactile cues for sequencing and mobility.  He continues to require assistance with all aspects of his mobility.  Family plans to take patient home vs. Appealing rehab decision.  He will likely need assistance at baseline due to his visual deficits at baseline.  Family reports they can provide adequate assistance and wish to take him home.  Based on family support at home will update recommendations to HHPT at this time as well as transport by car.  Will inform supervising PT of need for change in recommendations based on patient's current situation to allow for HHPT that he will need in the home settting.     If plan is discharge home, recommend the following: A lot of help with bathing/dressing/bathroom;Supervision due to cognitive status;Assistance with cooking/housework;Assist for transportation;Help with stairs or ramp for entrance;A lot of help with walking and/or transfers   Can travel by private vehicle     Yes  Equipment Recommendations  Rolling walker (2 wheels)    Recommendations for Other Services       Precautions / Restrictions Precautions Precautions: Fall Recall of Precautions/Restrictions: Impaired Precaution/Restrictions Comments: Blind Restrictions Weight Bearing Restrictions Per Provider Order: No     Mobility  Bed Mobility Overal bed mobility: Needs Assistance Bed Mobility: Supine to Sit     Supine to sit: Min assist     General  bed mobility comments: Increased time and effort to move in sitting.  Required tactile cues and minimal assistance for LE advancement, trunk elevation and scooting to edge of surface.    Transfers Overall transfer level: Needs assistance Equipment used: Rolling walker (2 wheels) Transfers: Sit to/from Stand Sit to Stand: Min assist           General transfer comment: Cues for hand placement to and from seated surface with assistance for anterior weight shift and tactile cues to improve posture in standing.  He continues to present with posterior lean but able to correct with min assistance.    Ambulation/Gait Ambulation/Gait assistance: Min assist Gait Distance (Feet): 50 Feet Assistive device: Rolling walker (2 wheels) Gait Pattern/deviations: Shuffle, Trunk flexed, Decreased stride length, Step-through pattern, Knee flexed in stance - right, Knee flexed in stance - left Gait velocity: decr     General Gait Details: pt ambulating with very short step through pattern with RW for support, hands on assist to guide RW due to visual deficits, pt with knees flexed.  Noted to have contracture in R knee which makes for a flexed gt pattern at baseline.   Stairs             Wheelchair Mobility     Tilt Bed    Modified Rankin (Stroke Patients Only)       Balance Overall balance assessment: Needs assistance Sitting-balance support: Feet supported Sitting balance-Leahy Scale: Fair Sitting balance - Comments: EOB   Standing balance support: Single extremity supported Standing balance-Leahy Scale: Poor  Communication Communication Communication: Impaired Factors Affecting Communication: Reduced clarity of speech;Hearing impaired  Cognition Arousal: Alert Behavior During Therapy: Flat affect   PT - Cognitive impairments: Sequencing, Initiation, Problem solving, Safety/Judgement   Orientation impairments: Time                    PT - Cognition Comments: cues for sequencing due to visual deficits Following commands: Intact Following commands impaired: Follows multi-step commands inconsistently    Cueing Cueing Techniques: Verbal cues, Gestural cues, Tactile cues  Exercises      General Comments        Pertinent Vitals/Pain Pain Assessment Pain Assessment: No/denies pain    Home Living                          Prior Function            PT Goals (current goals can now be found in the care plan section) Acute Rehab PT Goals Patient Stated Goal: to get stronger Potential to Achieve Goals: Fair Progress towards PT goals: Progressing toward goals    Frequency    Min 2X/week      PT Plan      Co-evaluation              AM-PAC PT 6 Clicks Mobility   Outcome Measure  Help needed turning from your back to your side while in a flat bed without using bedrails?: A Little Help needed moving from lying on your back to sitting on the side of a flat bed without using bedrails?: A Little Help needed moving to and from a bed to a chair (including a wheelchair)?: A Little Help needed standing up from a chair using your arms (e.g., wheelchair or bedside chair)?: A Little Help needed to walk in hospital room?: A Little Help needed climbing 3-5 steps with a railing? : A Lot 6 Click Score: 17    End of Session Equipment Utilized During Treatment: Gait belt Activity Tolerance: Patient tolerated treatment well Patient left: in chair;with call bell/phone within reach;with chair alarm set Nurse Communication: Mobility status;Other (comment) PT Visit Diagnosis: Other abnormalities of gait and mobility (R26.89);Muscle weakness (generalized) (M62.81);Other symptoms and signs involving the nervous system (R29.898)     Time: 8880-8851 PT Time Calculation (min) (ACUTE ONLY): 29 min  Charges:    $Gait Training: 8-22 mins $Therapeutic Activity: 8-22 mins PT General Charges $$ ACUTE  PT VISIT: 1 Visit                     Toya HAMS , PTA Acute Rehabilitation Services Office [phone]    Toya JINNY Gosling 04/23/2024, 11:57 AM

## 2024-07-07 NOTE — Progress Notes (Unsigned)
 Cardiology Office Note:   Date:  07/08/2024  ID:  Dustin Hess, DOB 08-16-1946, MRN [id number] PCP: Dustin Leni Edyth DELENA, MD  [facility] Providers Cardiologist:  Lynwood Schilling, MD {  History of Present Illness:   Dustin Hess is a 78 y.o. male with a hx of coronary disease with STEMI in 2005 status post PCI to RCA and LCx, dyslipidemia, hypertension, PACs. He was in hospital in June with metabolic encephalopathy.  He had AKI .  Following discharge he went to nursing facility.  I did look through extensive hospital notes.  CT was unremarkable for acute abnormality.  There was no acute findings on MRI of the brain.  His peak creatinine was 2.79 but improved with IV fluids.  There were no acute cardiac events that I could find.  Because of low blood pressures he did have his Imdur  reduced on discharge.  Metoprolol  also was reduced.  I did an extensive review of those hospital records.  I do see that there were no acute cardiac issues.  He did have an echo with his EF being 70 to 75%.  There were no significant valvular abnormalities.  He is now back at home with his wife and his daughter lives with him.  He has been retired for a few years from [employer] where he worked for 55 years.    The patient denies any new symptoms such as chest discomfort, neck or arm discomfort. There has been no new shortness of breath, PND or orthopnea. There have been no reported palpitations, presyncope or syncope.   ROS: As stated in the HPI and negative for all other systems.  Studies Reviewed:    EKG:     NA  Risk Assessment/Calculations:           Physical Exam:   VS:  BP (!) 134/90   Pulse 72   Ht 5' 3 (1.6 m)   Wt 135 lb (61.2 kg)   SpO2 98%   BMI 23.91 kg/m    Wt Readings from Last 3 Encounters:  07/08/24 135 lb (61.2 kg)  04/14/24 141 lb 15.6 oz (64.4 kg)  03/28/24 141 lb 15.6 oz (64.4 kg)     GEN: Well nourished, well developed in no acute  distress NECK: No JVD; No carotid bruits CARDIAC: RRR, no murmurs, rubs, gallops RESPIRATORY:  Clear to auscultation without rales, wheezing or rhonchi  ABDOMEN: Soft, non-tender, non-distended EXTREMITIES:  No edema; No deformity  NEURO:  Resting tremor.   ASSESSMENT AND PLAN:   Coronary artery disease:  The patient has no new sypmtoms.  No further cardiovascular testing is indicated.  We will continue with aggressive risk reduction and meds as listed.   I did extensive review of the hospital records.  He did have his dose of Imdur  decreased but he is not having any chest pain.  I will leave him on the lower dose.   Hypertension:  The BP is was running low in the hospital.  They reduced his beta-blocker.  He seems to be doing well and will remain on the dose as listed.  Mildly elevated.  However, this is unusual.  No change in therapy.   Hyperlipidemia : He had previously been at target.  No change in therapy.    AKI:  Creat returned to baseline at 1.08.    No change in therapy.       Follow up with me in one year.   Signed, Lynwood  Lavona, MD

## 2024-07-08 ENCOUNTER — Ambulatory Visit: Attending: Cardiology | Admitting: Cardiology

## 2024-07-08 ENCOUNTER — Encounter: Payer: Self-pay | Admitting: Cardiology

## 2024-07-08 VITALS — BP 134/90 | HR 72 | Ht 63.0 in | Wt 135.0 lb

## 2024-07-08 DIAGNOSIS — I10 Essential (primary) hypertension: Secondary | ICD-10-CM

## 2024-07-08 DIAGNOSIS — E785 Hyperlipidemia, unspecified: Secondary | ICD-10-CM

## 2024-07-08 DIAGNOSIS — I25708 Atherosclerosis of coronary artery bypass graft(s), unspecified, with other forms of angina pectoris: Secondary | ICD-10-CM | POA: Diagnosis not present

## 2024-07-08 NOTE — Patient Instructions (Signed)

## 2024-07-21 ENCOUNTER — Encounter: Payer: Self-pay | Admitting: Podiatry

## 2024-07-21 ENCOUNTER — Ambulatory Visit (INDEPENDENT_AMBULATORY_CARE_PROVIDER_SITE_OTHER): Admitting: Podiatry

## 2024-07-21 DIAGNOSIS — M79672 Pain in left foot: Secondary | ICD-10-CM | POA: Diagnosis not present

## 2024-07-21 DIAGNOSIS — M79671 Pain in right foot: Secondary | ICD-10-CM

## 2024-07-21 DIAGNOSIS — B351 Tinea unguium: Secondary | ICD-10-CM

## 2024-07-21 NOTE — Progress Notes (Signed)
 Patient presents for evaluation and treatment of tenderness and some redness around nails feet.  Tenderness around toes with walking and wearing shoes.  Physical exam:  General appearance: Alert, pleasant, and in no acute distress.  Vascular: Pedal pulses: DP 1/4 B/L, PT 0/4 B/L.  Moderate edema lower legs bilaterally  Neurologic:  Dermatologic:  Nails thickened, disfigured, discolored 1-5 BL with subungual debris.  Redness and hypertrophic nail folds along nail folds bilaterally but no signs of drainage or infection.  Musculoskeletal:     Diagnosis: 1. Painful onychomycotic nails 1 through 5 bilaterally. 2. Pain toes 1 through 5 bilaterally.  Plan: -Debrided onychomycotic nails 1 through 5 bilaterally.  Sharply debrided nails with nail clipper and reduced with a power bur.  Return 3 months RFC

## 2024-09-06 ENCOUNTER — Telehealth: Payer: Self-pay | Admitting: Cardiology

## 2024-09-06 ENCOUNTER — Other Ambulatory Visit (HOSPITAL_COMMUNITY): Payer: Self-pay | Admitting: *Deleted

## 2024-09-06 DIAGNOSIS — R1310 Dysphagia, unspecified: Secondary | ICD-10-CM

## 2024-09-06 MED ORDER — ISOSORBIDE MONONITRATE ER 30 MG PO TB24: 30.0000 mg | ORAL_TABLET | Freq: Every day | ORAL | 3 refills | Status: AC

## 2024-09-06 NOTE — Telephone Encounter (Signed)
*  STAT* If patient is at the pharmacy, call can be transferred to refill team.   1. Which medications need to be refilled? (please list name of each medication and dose if known) isosorbide  mononitrate (IMDUR ) 30 MG 24 hr tablet   2. Which pharmacy/location (including street and city if local pharmacy) is medication to be sent to? [facility] - [address] [place], [place], [place] [address]   3. Do they need a 30 day or 90 day supply?  90 day supply

## 2024-09-06 NOTE — Telephone Encounter (Signed)
 Refills has been sent to the pharmacy.

## 2024-09-28 ENCOUNTER — Inpatient Hospital Stay: Admitting: Hematology and Oncology

## 2024-09-28 ENCOUNTER — Inpatient Hospital Stay

## 2024-10-08 ENCOUNTER — Ambulatory Visit (HOSPITAL_COMMUNITY)
Admission: RE | Admit: 2024-10-08 | Discharge: 2024-10-08 | Disposition: A | Source: Ambulatory Visit | Attending: Family Medicine | Admitting: Family Medicine

## 2024-10-08 DIAGNOSIS — R1310 Dysphagia, unspecified: Secondary | ICD-10-CM

## 2024-10-08 DIAGNOSIS — Z9861 Coronary angioplasty status: Secondary | ICD-10-CM | POA: Insufficient documentation

## 2024-10-08 DIAGNOSIS — I2510 Atherosclerotic heart disease of native coronary artery without angina pectoris: Secondary | ICD-10-CM | POA: Insufficient documentation

## 2024-10-08 DIAGNOSIS — E039 Hypothyroidism, unspecified: Secondary | ICD-10-CM | POA: Diagnosis not present

## 2024-10-08 DIAGNOSIS — I10 Essential (primary) hypertension: Secondary | ICD-10-CM | POA: Diagnosis not present

## 2024-10-08 NOTE — Progress Notes (Signed)
 Modified Barium Swallow Study  Patient Details  Name: Dustin Hess MRN: [id number] Date of Birth: 1946-01-30  Today's Date: 10/08/2024  Modified Barium Swallow completed.  Full report located under Chart Review in the Imaging Section.  History of Present Illness 79 year old male with history of CAD with PCI in 2005, HTN, hypothyroidism, blindness who comes in from [place] for an OP MBS. No note in char tto report dysphagia, no family to explain visit. Pt states he has trouble with liquids more than solids and sometimes feels like swallowing coke makes his throat swollen. In prior visits he was on thin liquids and a dys 2 diet.   Clinical Impression Pt demonstrates a mild, primarily oral dysphagia, with possible sensory deficits. THere is brief oral holding with good containment. Pt has relatively good ROM and strength but has consistent oral residue on lingual surface and base of tongue. Oral liquid residue pools in valleculae post swallow. Pts use of a second swallow to clear oral residue or pharyngeal pooling is slow and inconsistent. A verbal cue does help. Pt to continue foods and liquids of choice. Pills whole in puree are best; pill lodged in valleculae. Unsure of baseline diet, but appears capable of mastication. Could potentially tolerate regular solids, but may need f/u with SLP at facility to choose best textures with a meal, especially given pts blindness. Factors that may increase risk of adverse event in presence of aspiration Dustin Hess & Dustin Hess 2021):    Swallow Evaluation Recommendations Recommendations: PO diet PO Diet Recommendation: Regular;Thin liquids (Level 0);Dysphagia 3 (Mechanical soft);Dysphagia 2 (Finely chopped) Medication Administration: Whole meds with puree Swallowing strategies  : Multiple dry swallows after each bite/sip Postural changes: Position pt fully upright for meals Oral care recommendations: Oral care BID (2x/day)    Dustin Fort, MA  CCC-SLP  Acute Rehabilitation Services Secure Chat Preferred Office [phone]   Dustin Hess 10/08/2024,3:33 PM

## 2024-10-19 NOTE — Progress Notes (Signed)
 " [facility] [phone]   [phone]  INITIAL CONSULT NOTE  Patient Care Team: [name], MD as PCP - General (Family Medicine) Lavona Agent, MD as PCP - Cardiology (Cardiology) Gretta Bonnie RAMAN, MD (Inactive) as Referring Physician (Internal Medicine) Lavona Agent, MD as Consulting Physician (Cardiology)  CHIEF COMPLAINTS/PURPOSE OF CONSULTATION:  Thrombocytopenia   HISTORY OF PRESENTING ILLNESS:  Dustin Hess 79 y.o. male with medical history significant for BPH, coronary artery disease status post STEMI with stent placement, GERD, dyslipidemia, hypertension and hypothyroidism who presents to the hematology clinic for evaluation for thrombocytopenia.  He is accompanied by his brother for this visit.  On review of the previous records, there is evidence of chronic thrombocytopenia starting March 2009.  Most recent labs from PCP on 06/14/2024 revealed WBC 3.5 (L), Hgb 12.8 (L), Plt 130K (L).   On exam today, Dustin Hess denies any signs of bleeding or large bruises.  He denies any changes to his energy or appetite.  He denies nausea, vomiting or dietary changes.  His bowel habits are unchanged without recurrent episodes of diarrhea or constipation.  He denies fevers, chills, night sweats, shortness of breath, chest pain or cough.  He has no other complaints.  Rest of the 10 point ROS as below.  MEDICAL HISTORY:  Past Medical History:  Diagnosis Date   Anemia, unspecified 12/22/2013   Arthritis    Benign prostatic hypertrophy    Congenital blindness    Coronary artery disease    a. 06/2004 Inf STEMI with PCI/BMS to RCA/LCX;  b. Cath 2014 40-50% focal bradycardia circ stenosis with a patent stent, RCA 50% stenosis, distal 60% stenosis in a previously stented area. 06/2011 Neg MV, EF 67%.   Dyslipidemia    GERD (gastroesophageal reflux disease)    GI bleeding 2006   Hypertension    Hypothyroidism    Leukopenia 12/22/2013     SURGICAL HISTORY: Past Surgical History:  Procedure Laterality Date   COLONOSCOPY     CORONARY ANGIOPLASTY WITH STENT PLACEMENT     ESOPHAGOGASTRODUODENOSCOPY     HEMORRHOID SURGERY     LEFT HEART CATHETERIZATION WITH CORONARY ANGIOGRAM N/A 04/30/2013   Procedure: LEFT HEART CATHETERIZATION WITH CORONARY ANGIOGRAM;  Surgeon: Ozell Fell, MD;  Location: [facility] CATH LAB;  Service: Cardiovascular;  Laterality: N/A;    SOCIAL HISTORY: Social History   Socioeconomic History   Marital status: Married    Spouse name: Not on file   Number of children: Not on file   Years of education: Not on file   Highest education level: Not on file  Occupational History   Not on file  Tobacco Use   Smoking status: Never   Smokeless tobacco: Never  Vaping Use   Vaping status: Never Used  Substance and Sexual Activity   Alcohol use: No   Drug use: No   Sexual activity: Not on file  Other Topics Concern   Not on file  Social History Narrative   Lives in [place] with your wife and dtr.  Works on [employer] for the [employer].  Does not routinely exercise.   Social Drivers of Health   Tobacco Use: Low Risk (10/21/2024)   Patient History    Smoking Tobacco Use: Never    Smokeless Tobacco Use: Never    Passive Exposure: Not on file  Financial Resource Strain: Not on file  Food Insecurity: No Food Insecurity (04/15/2024)   Epic    Worried About Running  Out of Food in the Last Year: Never true    Ran Out of Food in the Last Year: Never true  Transportation Needs: No Transportation Needs (04/15/2024)   Epic    Lack of Transportation (Medical): No    Lack of Transportation (Non-Medical): No  Physical Activity: Not on file  Stress: Not on file  Social Connections: Socially Isolated (04/15/2024)   Social Connection and Isolation Panel    Frequency of Communication with Friends and Family: Never    Frequency of Social Gatherings with Friends and Family: Once a week    Attends Religious  Services: Never    Database Administrator or Organizations: No    Attends Banker Meetings: Never    Marital Status: Married  Catering Manager Violence: Not At Risk (04/15/2024)   Epic    Fear of Current or Ex-Partner: No    Emotionally Abused: No    Physically Abused: No    Sexually Abused: No  Depression (PHQ2-9): Not on file  Alcohol Screen: Not on file  Housing: Unknown (04/15/2024)   Epic    Unable to Pay for Housing in the Last Year: No    Number of Times Moved in the Last Year: Not on file    Homeless in the Last Year: No  Utilities: Not At Risk (04/15/2024)   Epic    Threatened with loss of utilities: No  Health Literacy: Not on file    FAMILY HISTORY: Family History  Problem Relation Age of Onset   Cancer Mother 35       kidney   Cancer Father 72       blood cancer   Heart attack Cousin        died in her 49's.    ALLERGIES:  is allergic to benadryl [diphenhydramine hcl].  MEDICATIONS:  Current Outpatient Medications  Medication Sig Dispense Refill   acetaminophen  (TYLENOL ) 325 MG tablet Take 650 mg by mouth every 8 (eight) hours as needed for mild pain (pain score 1-3).     aspirin  81 MG tablet Take 81 mg by mouth daily.       atorvastatin  (LIPITOR) 20 MG tablet Take 20 mg by mouth daily.     Cholecalciferol (VITAMIN D3) 1000 UNITS CAPS Take 1 capsule by mouth daily.       dutasteride  (AVODART ) 0.5 MG capsule Take 0.5 mg by mouth daily.       isosorbide  mononitrate (IMDUR ) 30 MG 24 hr tablet Take 1 tablet (30 mg total) by mouth daily. 90 tablet 3   levothyroxine  (SYNTHROID ) 88 MCG tablet Take 88 mcg by mouth daily before breakfast.     metoprolol  succinate (TOPROL -XL) 25 MG 24 hr tablet Take 1 tablet (25 mg total) by mouth daily. TAKE WITH OR IMMEDIATELY FOLLOWING A MEAL.     Multiple Vitamin (MULTIVITAMIN) tablet Take 1 tablet by mouth daily.     nitroGLYCERIN  (NITROSTAT ) 0.4 MG SL tablet Place 1 tablet (0.4 mg total) under the tongue every 5  (five) minutes as needed. Must call and schedule appt for future refills 25 tablet 6   senna-docusate (SENOKOT-S) 8.6-50 MG tablet Take 1 tablet by mouth daily.     tamsulosin  (FLOMAX ) 0.4 MG CAPS capsule Take 0.8 mg by mouth daily.     ferrous sulfate  325 (65 FE) MG EC tablet Take 1 tablet (325 mg total) by mouth daily with breakfast. (Patient not taking: Reported on 10/21/2024)     No current facility-administered medications for this visit.  REVIEW OF SYSTEMS:   Constitutional: ( - ) fevers, ( - )  chills , ( - ) night sweats Eyes: ( - ) blurriness of vision, ( - ) double vision, ( - ) watery eyes Ears, nose, mouth, throat, and face: ( - ) mucositis, ( - ) sore throat Respiratory: ( - ) cough, ( - ) dyspnea, ( - ) wheezes Cardiovascular: ( - ) palpitation, ( - ) chest discomfort, ( - ) lower extremity swelling Gastrointestinal:  ( - ) nausea, ( - ) heartburn, ( - ) change in bowel habits Skin: ( - ) abnormal skin rashes Lymphatics: ( - ) new lymphadenopathy, ( - ) easy bruising Neurological: ( - ) numbness, ( - ) tingling, ( - ) new weaknesses Behavioral/Psych: ( - ) mood change, ( - ) new changes  All other systems were reviewed with the patient and are negative.  PHYSICAL EXAMINATION: ECOG PERFORMANCE STATUS: 0 - Asymptomatic  Vitals:   10/20/24 1107 10/20/24 1117  BP: (!) 155/76 (!) 153/84  Pulse: 77   Resp: 18   Temp: (!) 97.5 F (36.4 C)   SpO2: 100%    Filed Weights   10/20/24 1107  Weight: 136 lb 11.2 oz (62 kg)    GENERAL: well appearing male in NAD  SKIN: skin color, texture, turgor are normal, no rashes or significant lesions EYES: conjunctiva are pink and non-injected, sclera clear  LUNGS: clear to auscultation and percussion with normal breathing effort HEART: regular rate & rhythm and no murmurs and no lower extremity edema Musculoskeletal: no cyanosis of digits and no clubbing  PSYCH: alert & oriented x 3, fluent speech NEURO: no focal motor/sensory  deficits  LABORATORY DATA:  I have reviewed the data as listed    Latest Ref Rng & Units 10/20/2024   12:00 PM 04/21/2024    5:36 AM 04/18/2024    7:56 AM  CBC  WBC 4.0 - 10.5 K/uL 3.3  4.6  3.7   Hemoglobin 13.0 - 17.0 g/dL 87.6  86.0  87.1   Hematocrit 39.0 - 52.0 % 37.7  42.4  38.2   Platelets 150 - 400 K/uL 97  122  129        Latest Ref Rng & Units 10/20/2024   12:00 PM 04/21/2024    5:36 AM 04/18/2024    7:56 AM  CMP  Glucose 70 - 99 mg/dL 97  96  893   BUN 8 - 23 mg/dL 26  12  10    Creatinine 0.61 - 1.24 mg/dL 9.06  8.91  9.12   Sodium 135 - 145 mmol/L 147  140  140   Potassium 3.5 - 5.1 mmol/L 4.2  4.2  3.9   Chloride 98 - 111 mmol/L 109  104  106   CO2 22 - 32 mmol/L 31  26  24    Calcium  8.9 - 10.3 mg/dL 9.7  9.7  9.3   Total Protein 6.5 - 8.1 g/dL 7.3  6.8  6.6   Total Bilirubin 0.0 - 1.2 mg/dL 0.4  1.1  1.3   Alkaline Phos 38 - 126 U/L 62  107  123   AST 15 - 41 U/L 22  41  67   ALT 0 - 44 U/L 17  92  138      RADIOGRAPHIC STUDIES: I have personally reviewed the radiological images as listed and agreed with the findings in the report. [facility] ROYLENE [facility] OP MEDICARE SPEECH PATH Result Date: 10/08/2024 CLINICAL DATA:  79 year old male with history of CAD with PCI in 2005, HTN, hypothyroidism, blindness who comes in from [place] for an OP MBS. Ordered to evaluate dysphagia, background limited as family did not stay with patient after transporting to contribute to interview. EXAM: MODIFIED BARIUM SWALLOW TECHNIQUE: Radiologist, not in attendance for the exam. Different consistencies of barium were administered orally to the patient by the Speech Pathologist. Imaging of the pharynx was performed in the lateral projection. The radiology APP, Laymon Coast, NP was present in the fluoroscopy room for this study, providing personal supervision. FLUOROSCOPY: Radiation Exposure Index (as provided by the fluoroscopic device): 32.82 mGy Kerma COMPARISON:  04/05/24 MBSS FINDINGS:  Vestibular  Penetration:  None seen. Aspiration:  None seen. Other: Barium tablet briefly delayed within prominent vallecula, passed with applesauce. IMPRESSION: No vestibular penetration or aspiration seen. Please refer to the Speech Pathologists report for complete details and recommendations. Electronically Signed   By: Rockey Kilts M.D.   On: 10/08/2024 16:38    ASSESSMENT & PLAN Dustin Hess is a 79 y.o. male who presents to the clinic for evaluation of thrombocytopenia.    I reviewed potential etiologies for thrombocytopenia including liver disease, splenomegaly, infectious processes, nutritional anemias, immune mediated and bone marrow disorders. Patient is not taking any medications that can cause cytopenias. He denies any recent infections. Patient will proceed with labs today to check CBC, CMP, Vitamin B12, MMA, folate, LDH, Hepatitis B and C serologies, HIV serology, platelet by citrate and save smear.   #Thrombocytopenia, etiology unknown: --Labs today to check CBC w/diff, CMP, B12 level, folate level, copper level.  Hep B and C serologies, HIV serology. --Rule out pseudothrombocytopenia with platelet by citrate --Evaluate for platelet abnormality with save smear. --Evaluate for paraproteinemia with SPEP/IFE and serum free light chains. --Liver US  and CT abdomen/pelvis from 04/14/2024 showed no signs of liver disease or splenomegaly.  --RTC in 6 months with repeat labs unless above workup requires intervention   Orders Placed This Encounter  Procedures   CBC with Differential (Cancer Center Only)    Standing Status:   Future    Number of Occurrences:   1    Expiration Date:   10/20/2025   CMP (Cancer Center only)    Standing Status:   Future    Number of Occurrences:   1    Expiration Date:   10/20/2025   Vitamin B12    Standing Status:   Future    Number of Occurrences:   1    Expiration Date:   10/20/2025   Methylmalonic acid, serum    Standing Status:   Future     Number of Occurrences:   1    Expiration Date:   10/20/2025   Folate, Serum    Standing Status:   Future    Number of Occurrences:   1    Expiration Date:   10/20/2025   HIV antibody (with reflex)    Standing Status:   Future    Number of Occurrences:   1    Expiration Date:   10/20/2025   Hepatitis C antibody    Standing Status:   Future    Number of Occurrences:   1    Expiration Date:   10/20/2025   Hepatitis B surface antigen    Standing Status:   Future    Number of Occurrences:   1    Expiration Date:   10/20/2025   Hepatitis B surface antibody    Standing Status:  Future    Number of Occurrences:   1    Expiration Date:   10/20/2025   Hepatitis B core antibody, total    Standing Status:   Future    Number of Occurrences:   1    Expiration Date:   10/20/2025   Multiple Myeloma Panel (SPEP&IFE w/QIG)    Standing Status:   Future    Number of Occurrences:   1    Expiration Date:   10/20/2025   Kappa/lambda light chains    Standing Status:   Future    Number of Occurrences:   1    Expiration Date:   10/20/2025   Immature Platelet Fraction    Standing Status:   Future    Number of Occurrences:   1    Expiration Date:   10/20/2025   WBC/PLT in Citrate   Technologist smear review    Clinical information::   thrombocytopenia   Copper, serum    Standing Status:   Future    Number of Occurrences:   1    Expiration Date:   10/20/2025    All questions were answered. The patient knows to call the clinic with any problems, questions or concerns.  I have spent a total of 60 minutes minutes of face-to-face and non-face-to-face time, preparing to see the patient, obtaining and/or reviewing separately obtained history, performing a medically appropriate examination, counseling and educating the patient, ordering medications/tests/procedures,  documenting clinical information in the electronic health record, independently interpreting results and communicating results to the patient, and  care coordination.   Johnston Police, PA-C Department of Hematology/Oncology [facility] Phone: [phone]  I have read the above note and personally examined the patient. I agree with the assessment and plan as noted above.  Briefly Mr. Macedonio Scallon is a 79 year old male who presents for evaluation of thrombocytopenia.  The thrombocytopenia has been present for many years, dating back to at least 2009.  Most recent labs on 04/21/2024 show a white blood cell count 4.6, hemoglobin 13.9, MCV 94.2, and platelets of 122.  Today we will conduct a full thrombocytopenia workup to include platelet and citrate/immature platelet fraction, nutritional evaluation, and viral workup with hepatitis B, C, and HIV.  In the event no clear etiology can be found would recommend considering a bone marrow biopsy.  Prior abdominal imaging was performed on 04/15/2024 so repeat abdominal imaging is not required at this time.  The patient voiced understanding of our findings and plan moving forward.   Norleen IVAR Kidney, MD Department of Hematology/Oncology [facility] Phone: [phone] Pager: [phone] Email: [email or web address]  "

## 2024-10-20 ENCOUNTER — Inpatient Hospital Stay

## 2024-10-20 ENCOUNTER — Inpatient Hospital Stay: Attending: Physician Assistant | Admitting: Physician Assistant

## 2024-10-20 VITALS — BP 153/84 | HR 77 | Temp 97.5°F | Resp 18 | Wt 136.7 lb

## 2024-10-20 DIAGNOSIS — E785 Hyperlipidemia, unspecified: Secondary | ICD-10-CM | POA: Insufficient documentation

## 2024-10-20 DIAGNOSIS — I252 Old myocardial infarction: Secondary | ICD-10-CM | POA: Insufficient documentation

## 2024-10-20 DIAGNOSIS — N400 Enlarged prostate without lower urinary tract symptoms: Secondary | ICD-10-CM | POA: Diagnosis not present

## 2024-10-20 DIAGNOSIS — E039 Hypothyroidism, unspecified: Secondary | ICD-10-CM | POA: Diagnosis not present

## 2024-10-20 DIAGNOSIS — Z955 Presence of coronary angioplasty implant and graft: Secondary | ICD-10-CM | POA: Diagnosis not present

## 2024-10-20 DIAGNOSIS — D696 Thrombocytopenia, unspecified: Secondary | ICD-10-CM

## 2024-10-20 DIAGNOSIS — Z79890 Hormone replacement therapy (postmenopausal): Secondary | ICD-10-CM | POA: Diagnosis not present

## 2024-10-20 DIAGNOSIS — Z7982 Long term (current) use of aspirin: Secondary | ICD-10-CM | POA: Insufficient documentation

## 2024-10-20 DIAGNOSIS — I10 Essential (primary) hypertension: Secondary | ICD-10-CM | POA: Diagnosis not present

## 2024-10-20 DIAGNOSIS — I2510 Atherosclerotic heart disease of native coronary artery without angina pectoris: Secondary | ICD-10-CM | POA: Diagnosis not present

## 2024-10-20 DIAGNOSIS — M1990 Unspecified osteoarthritis, unspecified site: Secondary | ICD-10-CM | POA: Diagnosis not present

## 2024-10-20 DIAGNOSIS — Z809 Family history of malignant neoplasm, unspecified: Secondary | ICD-10-CM | POA: Diagnosis not present

## 2024-10-20 DIAGNOSIS — Z79899 Other long term (current) drug therapy: Secondary | ICD-10-CM | POA: Insufficient documentation

## 2024-10-20 LAB — CBC WITH DIFFERENTIAL (CANCER CENTER ONLY)
Abs Immature Granulocytes: 0.01 K/uL (ref 0.00–0.07)
Basophils Absolute: 0 K/uL (ref 0.0–0.1)
Basophils Relative: 0 %
Eosinophils Absolute: 0 K/uL (ref 0.0–0.5)
Eosinophils Relative: 1 %
HCT: 37.7 % — ABNORMAL LOW (ref 39.0–52.0)
Hemoglobin: 12.3 g/dL — ABNORMAL LOW (ref 13.0–17.0)
Immature Granulocytes: 0 %
Lymphocytes Relative: 15 %
Lymphs Abs: 0.5 K/uL — ABNORMAL LOW (ref 0.7–4.0)
MCH: 31.3 pg (ref 26.0–34.0)
MCHC: 32.6 g/dL (ref 30.0–36.0)
MCV: 95.9 fL (ref 80.0–100.0)
Monocytes Absolute: 0.4 K/uL (ref 0.1–1.0)
Monocytes Relative: 11 %
Neutro Abs: 2.4 K/uL (ref 1.7–7.7)
Neutrophils Relative %: 73 %
Platelet Count: 97 K/uL — ABNORMAL LOW (ref 150–400)
RBC: 3.93 MIL/uL — ABNORMAL LOW (ref 4.22–5.81)
RDW: 12.9 % (ref 11.5–15.5)
WBC Count: 3.3 K/uL — ABNORMAL LOW (ref 4.0–10.5)
nRBC: 0 % (ref 0.0–0.2)

## 2024-10-20 LAB — CMP (CANCER CENTER ONLY)
ALT: 17 U/L (ref 0–44)
AST: 22 U/L (ref 15–41)
Albumin: 4.3 g/dL (ref 3.5–5.0)
Alkaline Phosphatase: 62 U/L (ref 38–126)
Anion gap: 8 (ref 5–15)
BUN: 26 mg/dL — ABNORMAL HIGH (ref 8–23)
CO2: 31 mmol/L (ref 22–32)
Calcium: 9.7 mg/dL (ref 8.9–10.3)
Chloride: 109 mmol/L (ref 98–111)
Creatinine: 0.93 mg/dL (ref 0.61–1.24)
GFR, Estimated: >60 mL/min
Glucose, Bld: 97 mg/dL (ref 70–99)
Potassium: 4.2 mmol/L (ref 3.5–5.1)
Sodium: 147 mmol/L — ABNORMAL HIGH (ref 135–145)
Total Bilirubin: 0.4 mg/dL (ref 0.0–1.2)
Total Protein: 7.3 g/dL (ref 6.5–8.1)

## 2024-10-20 LAB — HEPATITIS B SURFACE ANTIGEN: Hepatitis B Surface Ag: NONREACTIVE

## 2024-10-20 LAB — TECHNOLOGIST SMEAR REVIEW

## 2024-10-20 LAB — IMMATURE PLATELET FRACTION: Immature Platelet Fraction: 5.6 % (ref 1.2–8.6)

## 2024-10-20 LAB — WBC/PLT IN CITRATE

## 2024-10-20 LAB — HEPATITIS B SURFACE ANTIBODY,QUALITATIVE: Hep B S Ab: NONREACTIVE

## 2024-10-20 LAB — HEPATITIS C ANTIBODY: HCV Ab: NONREACTIVE

## 2024-10-20 LAB — VITAMIN B12: Vitamin B-12: 724 pg/mL (ref 180–914)

## 2024-10-20 LAB — HIV ANTIBODY (ROUTINE TESTING W REFLEX): HIV Screen 4th Generation wRfx: NONREACTIVE

## 2024-10-20 LAB — FOLATE: Folate: >20 ng/mL

## 2024-10-21 ENCOUNTER — Encounter: Payer: Self-pay | Admitting: Podiatry

## 2024-10-21 ENCOUNTER — Ambulatory Visit: Admitting: Podiatry

## 2024-10-21 DIAGNOSIS — M79675 Pain in left toe(s): Secondary | ICD-10-CM

## 2024-10-21 DIAGNOSIS — B351 Tinea unguium: Secondary | ICD-10-CM | POA: Diagnosis not present

## 2024-10-21 DIAGNOSIS — M79674 Pain in right toe(s): Secondary | ICD-10-CM | POA: Diagnosis not present

## 2024-10-21 LAB — KAPPA/LAMBDA LIGHT CHAINS
Kappa free light chain: 36.9 mg/L — ABNORMAL HIGH (ref 3.3–19.4)
Kappa, lambda light chain ratio: 0.28 (ref 0.26–1.65)
Lambda free light chains: 133.5 mg/L — ABNORMAL HIGH (ref 5.7–26.3)

## 2024-10-21 LAB — HEPATITIS B CORE ANTIBODY, TOTAL: HEP B CORE AB: NEGATIVE

## 2024-10-21 NOTE — Progress Notes (Signed)
 Patient presents for evaluation and treatment of tenderness and some redness around nails feet.  Tenderness around toes with walking and wearing shoes.  Physical exam:  General appearance: Alert, pleasant, and in no acute distress.  Vascular: Pedal pulses: DP 1/4 B/L, PT 0/4 B/L.  Moderate edema lower legs bilaterally.  Capillary refill time immediate bilaterally  Neurologic:  Dermatologic:  Nails thickened, disfigured, discolored 1-5 BL with subungual debris.  Redness and hypertrophic nail folds along nail folds bilaterally but no signs of drainage or infection.  Musculoskeletal:     Diagnosis: 1. Painful onychomycotic nails 1 through 5 bilaterally. 2. Pain toes 1 through 5 bilaterally.  Plan: -Debrided onychomycotic nails 1 through 5 bilaterally.  Sharply debrided nails with nail clipper and reduced with a power bur.  Return 3 months RFC

## 2024-10-22 LAB — METHYLMALONIC ACID, SERUM: Methylmalonic Acid, Quantitative: 170 nmol/L (ref 0–378)

## 2024-10-22 LAB — COPPER, SERUM: Copper: 73 ug/dL (ref 66–121)

## 2024-10-23 LAB — MULTIPLE MYELOMA PANEL, SERUM
Albumin SerPl Elph-Mcnc: 3.9 g/dL (ref 2.9–4.4)
Albumin/Glob SerPl: 1.4 (ref 0.7–1.7)
Alpha 1: 0.2 g/dL (ref 0.0–0.4)
Alpha2 Glob SerPl Elph-Mcnc: 0.6 g/dL (ref 0.4–1.0)
B-Globulin SerPl Elph-Mcnc: 0.8 g/dL (ref 0.7–1.3)
Gamma Glob SerPl Elph-Mcnc: 1.3 g/dL (ref 0.4–1.8)
Globulin, Total: 2.9 g/dL (ref 2.2–3.9)
IgA: 194 mg/dL (ref 61–437)
IgG (Immunoglobin G), Serum: 1520 mg/dL (ref 603–1613)
IgM (Immunoglobulin M), Srm: 64 mg/dL (ref 15–143)
Total Protein ELP: 6.8 g/dL (ref 6.0–8.5)

## 2025-01-19 ENCOUNTER — Ambulatory Visit: Admitting: Podiatry

## 2025-04-20 ENCOUNTER — Inpatient Hospital Stay: Admitting: Physician Assistant

## 2025-04-20 ENCOUNTER — Inpatient Hospital Stay
